# Patient Record
Sex: Female | Born: 1970 | Race: Black or African American | Hispanic: No | Marital: Married | Smoking: Never smoker
Health system: Southern US, Community
[De-identification: ages and names within clinical notes are randomized; demographics above are authoritative.]

## PROBLEM LIST (undated history)

## (undated) DIAGNOSIS — I493 Ventricular premature depolarization: Secondary | ICD-10-CM

## (undated) HISTORY — DX: Ventricular premature depolarization: I49.3

---

## 2013-10-31 ENCOUNTER — Emergency Department (HOSPITAL_COMMUNITY)
Admission: EM | Admit: 2013-10-31 | Discharge: 2013-10-31 | Disposition: A | Discharge status: Home / Self Care | Payer: Federal, State, Local not specified - PPO | Source: Home / Self Care | Attending: Emergency Medicine | Admitting: Emergency Medicine

## 2013-10-31 ENCOUNTER — Encounter (HOSPITAL_COMMUNITY): Payer: Self-pay | Admitting: Emergency Medicine

## 2013-10-31 ENCOUNTER — Emergency Department (INDEPENDENT_AMBULATORY_CARE_PROVIDER_SITE_OTHER): Payer: Federal, State, Local not specified - PPO

## 2013-10-31 DIAGNOSIS — J111 Influenza due to unidentified influenza virus with other respiratory manifestations: Secondary | ICD-10-CM

## 2013-10-31 DIAGNOSIS — R69 Illness, unspecified: Principal | ICD-10-CM

## 2013-10-31 MED ORDER — BENZONATATE 200 MG PO CAPS: 200.0000 mg | ORAL_CAPSULE | Freq: Three times a day (TID) | ORAL | Status: DC | PRN

## 2013-10-31 MED ORDER — OSELTAMIVIR PHOSPHATE 75 MG PO CAPS: 75.0000 mg | ORAL_CAPSULE | Freq: Two times a day (BID) | ORAL | Status: DC

## 2013-10-31 MED ORDER — TRAMADOL HCL 50 MG PO TABS: 100.0000 mg | ORAL_TABLET | Freq: Three times a day (TID) | ORAL | Status: DC | PRN

## 2013-10-31 MED ORDER — HYDROCOD POLST-CHLORPHEN POLST 10-8 MG/5ML PO LQCR: 5.0000 mL | Freq: Two times a day (BID) | ORAL | Status: DC | PRN

## 2013-10-31 NOTE — ED Notes (Signed)
C/o cold sx that include sinus drainage, chills, headache/pressure, itching/tingling feeling, body aches, cough with some mucous, and congestion that started 5 days ago. Stated that she has had some nausea and diarrhea. Has taken OTC medications with no relief. Written by: Marga MelnickQuaNeisha Jones, SMA

## 2013-10-31 NOTE — ED Provider Notes (Signed)
Chief Complaint   Chief Complaint  Patient presents with  . URI    History of Present Illness   Nyra Anspaugh is a 43 year old female who has had a 4 to five-day history of chills, myalgias, and fever of up to 101. She has sinus pain and pressure, headache, ear congestion, and nasal congestion with yellow drainage. She feels itching, and tingling of the skin, but no rash. Her muscles feel weak, tired, and achy. She's had a sore throat and a cough productive yellow sputum. She has had some diarrhea and nausea. She's had no specific exposures, however she does work at [employer] and is exposed to [employer]. She has not had an influenza vaccine this year.  Review of Systems   Other than as noted above, the patient denies any of the following symptoms: Systemic:  No fevers, chills, sweats, or myalgias. Eye:  No redness or discharge. ENT:  No ear pain, headache, nasal congestion, drainage, sinus pressure, or sore throat. Neck:  No neck pain, stiffness, or swollen glands. Lungs:  No cough, sputum production, hemoptysis, wheezing, chest tightness, shortness of breath or chest pain. GI:  No abdominal pain, nausea, vomiting or diarrhea.  PMFSH   Past medical history, family history, social history, meds, and allergies were reviewed. She is allergic to sulfa drugs.  Physical exam   Vital signs:  BP 126/75  Pulse 78  Temp(Src) 98 F (36.7 C) (Oral)  Resp 16  SpO2 100%  LMP 10/12/2013 General:  Alert and oriented.  In no distress.  Skin warm and dry. Eye:  No conjunctival injection or drainage. Lids were normal. ENT:  TMs and canals were normal, without erythema or inflammation.  Nasal mucosa was clear and uncongested, without drainage.  Mucous membranes were moist.  Pharynx was clear with no exudate or drainage.  There were no oral ulcerations or lesions. Neck:  Supple, no adenopathy, tenderness or mass. Lungs:  No respiratory distress.  Lungs were clear to auscultation, without  wheezes, rales or rhonchi.  Breath sounds were clear and equal bilaterally.  Heart:  Regular rhythm, without gallops, murmers or rubs. Skin:  Clear, warm, and dry, without rash or lesions.  Radiology   Dg Chest 2 View  10/31/2013   CLINICAL DATA:  Flu-like symptoms for past several days  EXAM: CHEST  2 VIEW  COMPARISON:  None.  FINDINGS: The lungs are well-expanded and clear. The cardiopericardial silhouette is normal in size. The mediastinum is normal in width. There is no pleural effusion or pneumothorax. The observed portions of the bony thorax appear normal.  IMPRESSION: There is no evidence of pneumonia nor other acute cardiopulmonary abnormality. Borderline hyperinflation may be voluntary or could reflect underlying reactive airway disease or acute bronchitis.   Electronically Signed   By: Clarrisa Kaylor  [place]   On: 10/31/2013 10:09   Assessment     The encounter diagnosis was Influenza-like illness.  Plan    1.  Meds:  The following meds were prescribed:   Discharge Medication List as of 10/31/2013 10:20 AM    START taking these medications   Details  benzonatate (TESSALON) 200 MG capsule Take 1 capsule (200 mg total) by mouth 3 (three) times daily as needed for cough., Starting 10/31/2013, Until Discontinued, Normal    chlorpheniramine-HYDROcodone (TUSSIONEX) 10-8 MG/5ML LQCR Take 5 mLs by mouth every 12 (twelve) hours as needed for cough., Starting 10/31/2013, Until Discontinued, Normal    oseltamivir (TAMIFLU) 75 MG capsule Take 1 capsule (75 mg total) by  mouth every 12 (twelve) hours., Starting 10/31/2013, Until Discontinued, Normal    traMADol (ULTRAM) 50 MG tablet Take 2 tablets (100 mg total) by mouth every 8 (eight) hours as needed., Starting 10/31/2013, Until Discontinued, Normal        2.  Patient Education/Counseling:  The patient was given appropriate handouts, self care instructions, and instructed in symptomatic relief.  Instructed to get extra fluids, rest, and use a cool mist  vaporizer.   3.  Follow up:  The patient was told to follow up here if no better in 3 to 4 days, or sooner if becoming worse in any way, and given some red flag symptoms such as increasing fever, difficulty breathing, chest pain, or persistent vomiting which would prompt immediate return.  Follow up here as needed.      Reuben Likesavid C Gunner Iodice, MD 10/31/13 1106

## 2013-10-31 NOTE — Discharge Instructions (Signed)
Most upper respiratory infections are caused by viruses and do not require antibiotics.  We try to save the antibiotics for when we really need them to prevent bacteria from developing resistance to them.  Here are a few hints about things that can be done at home to help get over an upper respiratory infection quicker: ° °Get extra sleep and extra fluids.  Get 7 to 9 hours of sleep per night and 6 to 8 glasses of water a day.  Getting extra sleep keeps the immune system from getting run down.  Most people with an upper respiratory infection are a little dehydrated.  The extra fluids also keep the secretions liquified and easier to deal with.  Also, get extra vitamin C.  4000 mg per day is the recommended dose. °For the aches, headache, and fever, acetaminophen or ibuprofen are helpful.  These can be alternated every 4 hours.  People with liver disease should avoid large amounts of acetaminophen, and people with ulcer disease, gastroesophageal reflux, gastritis, congestive heart failure, chronic kidney disease, coronary artery disease and the elderly should avoid ibuprofen. °For nasal congestion try Mucinex-D, or if you're having lots of sneezing or clear nasal drainage use Zyrtec-D. People with high blood pressure can take these if their blood pressure is controlled, if not, it's best to avoid the forms with a "D" (decongestants).  You can use the plain Mucinex, Allegra, Claritin, or Zyrtec even if your blood pressure is not controlled.   °A Saline nasal spray such as Ocean Spray can also help.  You can add a decongestant sprays such as Afrin, but you should not use the decongestant sprays for more than 3 or 4 days since they can be habituating.  Breathe Rite nasal strips can also offer a non-drug alternative treatment to nasal congestion, especially at night. °For people with symptoms of sinusitis, sleeping with your head elevated can be helpful.  For sinus pain, moist, hot compresses to the face may provide some  relief.  Many people find that inhaling steam as in a shower or from a pot of steaming water can help. °For any viral infection, zinc containing lozenges such as Cold-Eze or Zicam are helpful.  Zinc helps to fight viral infection.  Hot salt water gargles (8 oz of hot water, 1/2 tsp of table salt, and a pinch of baking soda) can give relief as well as hot beverages such as hot tea.  Sucrets extra strength lozenges will help the sore throat.  °For the cough, take Delsym 2 tsp every 12 hours.  It has also been found recently that Aleve can help control a cough.  The dose is 1 to 2 tablets twice daily with food.  This can be combined with Delsym. (Note, if you are taking ibuprofen, you should not take Aleve as well--take one or the other.) °A cool mist vaporizer will help keep your mucous membranes from drying out.  ° °It's important when you have an upper respiratory infection not to pass the infection to others.  This involves being very careful about the following: ° °Frequent hand washing or use of hand sanitizer, especially after coughing, sneezing, blowing your nose or touching your face, nose or eyes. °Do not shake hands or touch anyone and try to avoid touching surfaces that other people use such as doorknobs, shopping carts, telephones and computer keyboards. °Use tissues and dispose of them properly in a garbage can or ziplock bag. °Cough into your sleeve. °Do not let others eat or   drink after you. ° °It's also important to recognize the signs of serious illness and get evaluated if they occur: °Any respiratory infection that lasts more than 7 to 10 days.  Yellow nasal drainage and sputum are not reliable indicators of a bacterial infection, but if they last for more than 1 week, see your doctor. °Fever and sore throat can indicate strep. °Fever and cough can indicate influenza or pneumonia. °Any kind of severe symptom such as difficulty breathing, intractable vomiting, or severe pain should prompt you to see  a doctor as soon as possible. ° ° °Your body's immune system is really the thing that will get rid of this infection.  Your immune system is comprised of 2 types of specialized cells called T cells and B cells.  T cells coordinate the array of cells in your body that engulf invading bacteria or viruses while B cells orchestrate the production of antibodies that neutralize infection.  Anything we do or any medications we give you, will just strengthen your immune system or help it clear up the infection quicker.  Here are a few helpful hints to improve your immune system to help overcome this illness or to prevent future infections: °· A few vitamins can improve the health of your immune system.  That's why your diet should include plenty of fruits, vegetables, fish, nuts, and whole grains. °· Vitamin A and bet-carotene can increase the cells that fight infections (T cells and B cells).  Vitamin A is abundant in dark greens and orange vegetables such as spinach, greens, sweet potatoes, and carrots. °· Vitamin B6 contributes to the maturation of white blood cells, the cells that fight disease.  Foods with vitamin B6 include cold cereal and bananas. °· Vitamin C is credited with preventing colds because it increases white blood cells and also prevents cellular damage.  Citrus fruits, peaches and green and red bell peppers are all hight in vitamin C. °· Vitamin E is an anti-oxidant that encourages the production of natural killer cells which reject foreign invaders and B cells that produce antibodies.  Foods high in vitamin E include wheat germ, nuts and seeds. °· Foods high in omega-3 fatty acids found in foods like salmon, tuna and mackerel boost your immune system and help cells to engulf and absorb germs. °· Probiotics are good bacteria that increase your T cells.  These can be found in yogurt and are available in supplements such as Culturelle or Align. °· Moderate exercise increases the strength of your immune  system and your ability to recover from illness.  I suggest 3 to 5 moderate intensity 30 minute workouts per week.   °· Sleep is another component of maintaining a strong immune system.  It enables your body to recuperate from the day's activities, stress and work.  My recommendation is to get between 7 and 9 hours of sleep per night. °· If you smoke, try to quit completely or at least cut down.  Drink alcohol only in moderation if at all.  No more than 2 drinks daily for men or 1 for women. °· Get a flu vaccine early in the fall or if you have not gotten one yet, once this illness has run its course.  If you are over 65, a smoker, or an asthmatic, get a pneumococcal vaccine. °· My final recommendation is to maintain a healthy weight.  Excess weight can impair the immune system by interfering with the way the immune system deals with invading viruses or   bacteria. ° ° ° °Influenza, Adult °Influenza ("the flu") is a viral infection of the respiratory tract. It occurs more often in winter months because people spend more time in close contact with one another. Influenza can make you feel very sick. Influenza easily spreads from person to person (contagious). °CAUSES  °Influenza is caused by a virus that infects the respiratory tract. You can catch the virus by breathing in droplets from an infected person's cough or sneeze. You can also catch the virus by touching something that was recently contaminated with the virus and then touching your mouth, nose, or eyes. °SYMPTOMS  °Symptoms typically last 4 to 10 days and may include: °· Fever. °· Chills. °· Headache, body aches, and muscle aches. °· Sore throat. °· Chest discomfort and cough. °· Poor appetite. °· Weakness or feeling tired. °· Dizziness. °· Nausea or vomiting. °DIAGNOSIS  °Diagnosis of influenza is often made based on your history and a physical exam. A nose or throat swab test can be done to confirm the diagnosis. °RISKS AND COMPLICATIONS °You may be at risk  for a more severe case of influenza if you smoke cigarettes, have diabetes, have chronic heart disease (such as heart failure) or lung disease (such as asthma), or if you have a weakened immune system. Elderly people and pregnant women are also at risk for more serious infections. The most common complication of influenza is a lung infection (pneumonia). Sometimes, this complication can require emergency medical care and may be life-threatening. °PREVENTION  °An annual influenza vaccination (flu shot) is the best way to avoid getting influenza. An annual flu shot is now routinely recommended for all adults in the [place] °TREATMENT  °In mild cases, influenza goes away on its own. Treatment is directed at relieving symptoms. For more severe cases, your caregiver may prescribe antiviral medicines to shorten the sickness. Antibiotic medicines are not effective, because the infection is caused by a virus, not by bacteria. °HOME CARE INSTRUCTIONS °· Only take over-the-counter or prescription medicines for pain, discomfort, or fever as directed by your caregiver. °· Use a cool mist humidifier to make breathing easier. °· Get plenty of rest until your temperature returns to normal. This usually takes 3 to 4 days. °· Drink enough fluids to keep your urine clear or pale yellow. °· Cover your mouth and nose when coughing or sneezing, and wash your hands well to avoid spreading the virus. °· Stay home from work or school until your fever has been gone for at least 1 full day. °SEEK MEDICAL CARE IF:  °· You have chest pain or a deep cough that worsens or produces more mucus. °· You have nausea, vomiting, or diarrhea. °SEEK IMMEDIATE MEDICAL CARE IF:  °· You have difficulty breathing, shortness of breath, or your skin or nails turn bluish. °· You have severe neck pain or stiffness. °· You have a severe headache, facial pain, or earache. °· You have a worsening or recurring fever. °· You have nausea or vomiting that cannot be  controlled. °MAKE SURE YOU: °· Understand these instructions. °· Will watch your condition. °· Will get help right away if you are not doing well or get worse. °Document Released: 10/07/2000 Document Revised: 04/10/2012 Document Reviewed: 01/09/2012 °[employer]® Patient Information ©2014 [employer]. ° °

## 2013-11-04 NOTE — ED Notes (Signed)
Call from pharmacy. There has reportedly been a back order on tessalon due to [employer] recall; none available until March. What will the MD authorize for us to use instead. Spoke w Dr Lorenz CoasterKeller, who wishes pt to use Tussionex at night and delsym OTC as per label instructions during day hours. [facility] and spoke directly w pharmacist to advise of MD instructions

## 2013-12-13 ENCOUNTER — Emergency Department (HOSPITAL_COMMUNITY)
Admission: EM | Admit: 2013-12-13 | Discharge: 2013-12-13 | Disposition: A | Discharge status: Home / Self Care | Payer: Federal, State, Local not specified - PPO | Source: Home / Self Care | Attending: Family Medicine | Admitting: Family Medicine

## 2013-12-13 ENCOUNTER — Encounter (HOSPITAL_COMMUNITY): Payer: Self-pay | Admitting: Emergency Medicine

## 2013-12-13 DIAGNOSIS — J329 Chronic sinusitis, unspecified: Secondary | ICD-10-CM

## 2013-12-13 MED ORDER — DOXYCYCLINE HYCLATE 100 MG PO CAPS: 100.0000 mg | ORAL_CAPSULE | Freq: Two times a day (BID) | ORAL | Status: DC

## 2013-12-13 MED ORDER — GUAIFENESIN-CODEINE 100-10 MG/5ML PO SOLN: 10.0000 mL | Freq: Three times a day (TID) | ORAL | Status: DC | PRN

## 2013-12-13 MED ORDER — METHYLPREDNISOLONE 4 MG PO KIT: PACK | ORAL | Status: DC

## 2013-12-13 MED ORDER — FLUTICASONE PROPIONATE 50 MCG/ACT NA SUSP: 2.0000 | Freq: Two times a day (BID) | NASAL | Status: DC

## 2013-12-13 MED ORDER — MAGIC MOUTHWASH W/LIDOCAINE: ORAL | Status: DC

## 2013-12-13 NOTE — ED Provider Notes (Signed)
CSN: [id number]     Arrival date & time 12/13/13  1009 History   First MD Initiated Contact with Patient 12/13/13 1028     Chief Complaint  Patient presents with  . URI     (Consider location/radiation/quality/duration/timing/severity/associated sxs/prior Treatment) HPI Comments: 14f presents c/o sinus pressure, sore throat, cough, bilateral ear pain, sneezing, chills for 1 week.  Her symptoms have all been gradually worsening. she has a history of sinus infections and believes this may be what is going on. She denies pleuritic chest pain, shortness of breath. She has no recent travel or sick contacts. She has been taking over-the-counter medications which do help only temporarily.  Patient is a 43 y.o. female presenting with URI.  URI Presenting symptoms: congestion, cough, ear pain, rhinorrhea and sore throat   Presenting symptoms: no fever   Associated symptoms: sneezing   Associated symptoms: no arthralgias and no myalgias     History reviewed. No pertinent past medical history. History reviewed. No pertinent past surgical history. History reviewed. No pertinent family history. History  Substance Use Topics  . Smoking status: Never Smoker   . Smokeless tobacco: Not on file  . Alcohol Use: No   OB History   Grav Para Term Preterm Abortions TAB SAB Ect Mult Living                 Review of Systems  Constitutional: Negative for fever and chills.  HENT: Positive for congestion, ear pain, postnasal drip, rhinorrhea, sinus pressure, sneezing and sore throat. Negative for trouble swallowing.   Eyes: Negative for visual disturbance.  Respiratory: Positive for cough. Negative for chest tightness and shortness of breath.   Cardiovascular: Negative for chest pain, palpitations and leg swelling.  Gastrointestinal: Negative for nausea, vomiting and abdominal pain.  Endocrine: Negative for polydipsia and polyuria.  Genitourinary: Negative for dysuria, urgency and frequency.   Musculoskeletal: Negative for arthralgias and myalgias.  Skin: Negative for rash.  Neurological: Negative for dizziness, weakness and light-headedness.      Allergies  Sulfa antibiotics  Home Medications   Current Outpatient Rx  Name  Route  Sig  Dispense  Refill  . benzonatate (TESSALON) 200 MG capsule   Oral   Take 1 capsule (200 mg total) by mouth 3 (three) times daily as needed for cough.   30 capsule   0   . chlorpheniramine-HYDROcodone (TUSSIONEX) 10-8 MG/5ML LQCR   Oral   Take 5 mLs by mouth every 12 (twelve) hours as needed for cough.   140 mL   0   . doxycycline (VIBRAMYCIN) 100 MG capsule   Oral   Take 1 capsule (100 mg total) by mouth 2 (two) times daily.   20 capsule   0   . fluticasone (FLONASE) 50 MCG/ACT nasal spray   Each Nare   Place 2 sprays into both nostrils 2 (two) times daily.   1 g   2   . guaiFENesin-codeine 100-10 MG/5ML syrup   Oral   Take 10 mLs by mouth 3 (three) times daily as needed for cough.   120 mL   0   . methylPREDNISolone (MEDROL DOSEPAK) 4 MG tablet      follow package directions   21 tablet   0     Dispense as written.   . Multiple Vitamin (MULTIVITAMIN) tablet   Oral   Take 1 tablet by mouth daily.         Marland Kitchen oseltamivir (TAMIFLU) 75 MG capsule   Oral  Take 1 capsule (75 mg total) by mouth every 12 (twelve) hours.   10 capsule   0   . traMADol (ULTRAM) 50 MG tablet   Oral   Take 2 tablets (100 mg total) by mouth every 8 (eight) hours as needed.   30 tablet   0    BP 111/54  Pulse 60  Temp(Src) 98.8 F (37.1 C) (Oral)  Resp 20  SpO2 100%  LMP 12/07/2013 Physical Exam  Nursing note and vitals reviewed. Constitutional: She is oriented to person, place, and time. Vital signs are normal. She appears well-developed and well-nourished. No distress.  HENT:  Head: Normocephalic and atraumatic.  Right Ear: Tympanic membrane, external ear and ear canal normal.  Left Ear: Tympanic membrane, external ear  and ear canal normal.  Nose: Mucosal edema and rhinorrhea present. Right sinus exhibits maxillary sinus tenderness and frontal sinus tenderness. Left sinus exhibits maxillary sinus tenderness and frontal sinus tenderness.  Mouth/Throat: Uvula is midline and mucous membranes are normal. Posterior oropharyngeal erythema present. No oropharyngeal exudate or posterior oropharyngeal edema.  Purulent drainage in the posterior oropharynx   Neck: Normal range of motion. Neck supple.  Cardiovascular: Normal rate, regular rhythm and normal heart sounds.  Exam reveals no gallop and no friction rub.   No murmur heard. Pulmonary/Chest: Effort normal and breath sounds normal. No respiratory distress. She has no wheezes. She has no rales.  Lymphadenopathy:    She has cervical adenopathy (tonsillar).  Neurological: She is alert and oriented to person, place, and time. She has normal strength. Coordination normal.  Skin: Skin is warm and dry. No rash noted. She is not diaphoretic.  Psychiatric: She has a normal mood and affect. Judgment normal.    ED Course  Procedures (including critical care time) Labs Review Labs Reviewed - No data to display Imaging Review No results found.    MDM   Final diagnoses:  Sinusitis   Still worsening after a week, probable bacterial sinusitis.  Treating with doxy, f/u if not improving   Meds ordered this encounter  Medications  . doxycycline (VIBRAMYCIN) 100 MG capsule    Sig: Take 1 capsule (100 mg total) by mouth 2 (two) times daily.    Dispense:  20 capsule    Refill:  0    Order Specific Question:  Supervising Provider    Answer:  Linna HoffKINDL, JAMES D [phone][[id number]]  . methylPREDNISolone (MEDROL DOSEPAK) 4 MG tablet    Sig: follow package directions    Dispense:  21 tablet    Refill:  0    Order Specific Question:  Supervising Provider    Answer:  Linna HoffKINDL, JAMES D [phone][[id number]]  . fluticasone (FLONASE) 50 MCG/ACT nasal spray    Sig: Place 2 sprays into both nostrils 2 (two)  times daily.    Dispense:  1 g    Refill:  2    Order Specific Question:  Supervising Provider    Answer:  Linna HoffKINDL, JAMES D [phone][[id number]]  . guaiFENesin-codeine 100-10 MG/5ML syrup    Sig: Take 10 mLs by mouth 3 (three) times daily as needed for cough.    Dispense:  120 mL    Refill:  0    Order Specific Question:  Supervising Provider    Answer:  Bradd CanaryKINDL, JAMES D [[id number]]       Graylon GoodZachary H Jeidi Gilles, PA-C 12/13/13 1101

## 2013-12-13 NOTE — ED Notes (Signed)
C/o  Sinus pressure and pain.  Pain with swallowing.  Bilateral ear discomfort.  Productive cough.  Sneezing and chills.  Mild relief with otc meds.  Symptoms present x 1 wk.  Denies n/v/d.

## 2013-12-13 NOTE — ED Provider Notes (Signed)
Medical screening examination/treatment/procedure(s) were performed by resident physician or non-physician practitioner and as supervising physician I was immediately available for consultation/collaboration.   Barkley BrunsKINDL,JAMES DOUGLAS MD.   Linna HoffJames D Kindl, MD 12/13/13 [phone]

## 2013-12-13 NOTE — Discharge Instructions (Signed)

## 2013-12-25 ENCOUNTER — Other Ambulatory Visit: Payer: Self-pay | Admitting: Orthopedic Surgery

## 2013-12-25 DIAGNOSIS — M542 Cervicalgia: Secondary | ICD-10-CM

## 2013-12-27 ENCOUNTER — Telehealth (HOSPITAL_COMMUNITY): Payer: Self-pay | Admitting: *Deleted

## 2013-12-27 NOTE — ED Notes (Signed)
Pharmacy called to have Rx of Flonase changed from 2 sprays twice a day to 2 sprays once a day. Rx changed to 2 sprays once a day by Dr. Melrose NakayamaKindle.

## 2013-12-29 ENCOUNTER — Ambulatory Visit
Admission: RE | Admit: 2013-12-29 | Discharge: 2013-12-29 | Disposition: A | Discharge status: Home / Self Care | Payer: Federal, State, Local not specified - PPO | Source: Ambulatory Visit | Attending: Orthopedic Surgery | Admitting: Orthopedic Surgery

## 2013-12-29 DIAGNOSIS — M542 Cervicalgia: Secondary | ICD-10-CM

## 2014-01-14 ENCOUNTER — Other Ambulatory Visit: Payer: Self-pay

## 2014-01-14 DIAGNOSIS — Z1231 Encounter for screening mammogram for malignant neoplasm of breast: Secondary | ICD-10-CM

## 2014-01-24 ENCOUNTER — Ambulatory Visit
Admission: RE | Admit: 2014-01-24 | Discharge: 2014-01-24 | Disposition: A | Discharge status: Home / Self Care | Payer: Federal, State, Local not specified - PPO | Source: Ambulatory Visit

## 2014-01-24 ENCOUNTER — Other Ambulatory Visit: Payer: Self-pay

## 2014-01-24 DIAGNOSIS — Z1231 Encounter for screening mammogram for malignant neoplasm of breast: Secondary | ICD-10-CM

## 2016-05-04 ENCOUNTER — Ambulatory Visit (INDEPENDENT_AMBULATORY_CARE_PROVIDER_SITE_OTHER): Payer: Federal, State, Local not specified - PPO | Admitting: Family Medicine

## 2016-05-04 ENCOUNTER — Ambulatory Visit (INDEPENDENT_AMBULATORY_CARE_PROVIDER_SITE_OTHER): Payer: Federal, State, Local not specified - PPO

## 2016-05-04 VITALS — BP 122/82 | HR 65 | Temp 98.7°F | Resp 16 | Ht 66.0 in | Wt 215.0 lb

## 2016-05-04 DIAGNOSIS — R05 Cough: Secondary | ICD-10-CM

## 2016-05-04 DIAGNOSIS — J189 Pneumonia, unspecified organism: Secondary | ICD-10-CM

## 2016-05-04 DIAGNOSIS — R059 Cough, unspecified: Secondary | ICD-10-CM

## 2016-05-04 MED ORDER — HYDROCODONE-HOMATROPINE 5-1.5 MG/5ML PO SYRP: 5.0000 mL | ORAL_SOLUTION | Freq: Three times a day (TID) | ORAL | Status: DC | PRN

## 2016-05-04 MED ORDER — LEVOFLOXACIN 500 MG PO TABS: 500.0000 mg | ORAL_TABLET | Freq: Every day | ORAL | Status: DC

## 2016-05-04 NOTE — Progress Notes (Signed)
   HPI  Patient presents today here with cough, new patient  Pt explains she has had 4-5 days of cough productive of thick green sputum, mild dyspnea, congestion. She reports fever as high as 101.8 Has chills, sweats, and lmalaise  She is tolerating food and fluids normally.   Her cough is very persistent, Not responding to mucinex, robitussin    PMH: Smoking status noted fam Hx positive for DM2, HTN, HLD + in mother and father Allergic to sulfa No sig medical Hx Non smoker, doesnt drink alcohol  ROS: Per HPI  Objective: BP 122/82 mmHg  Pulse 65  Temp(Src) 98.7 F (37.1 C) (Oral)  Resp 16  Ht 5\' 6"  (1.676 m)  Wt 215 lb (97.523 kg)  BMI 34.72 kg/m2  SpO2 98%  LMP 05/03/2016 (Exact Date) Gen: NAD, alert, cooperative with exam, sweating HEENT: NCAT, TMs WNL, Nares with swollen turbinates, Slightky enlarged tonsils without exudates CV: RRR, good S1/S2, no murmur Resp: CTABL, no wheezes, non-labored Ext: No edema, warm Neuro: Alert and oriented, No gross deficits  CXR with infiltrate and likely RUL PNA  Assessment and plan:  # CAP Treat with levaquin, Hycodan for night-time cough RTC with any concerns or failure to improve, recommend f/u CXR in 4 weeks.     Orders Placed This Encounter  Procedures  . DG Chest 2 View    Standing Status: Future     Number of Occurrences: 1     Standing Expiration Date: 07/05/2017    Order Specific Question:  Reason for Exam (SYMPTOM  OR DIAGNOSIS REQUIRED)    Answer:  cough, eval for CAP    Order Specific Question:  Is the patient pregnant?    Answer:  No    Order Specific Question:  Preferred imaging location?    Answer:  External    No orders of the defined types were placed in this encounter.    Kevin FentonSamuel Bradshaw, MD 11:59 AM

## 2016-05-04 NOTE — Patient Instructions (Addendum)
Great to meet you!  I am treating you for pneumonia with levaquin.   I recommend follow up in 3-4 weeks to make sure your chest x ray looks completely resolved.   Community-Acquired Pneumonia, Adult Pneumonia is an infection of the lungs. There are different types of pneumonia. One type can develop while a person is in a hospital. A different type, called community-acquired pneumonia, develops in people who are not, or have not recently been, in the hospital or other health care facility.  CAUSES Pneumonia may be caused by bacteria, viruses, or funguses. Community-acquired pneumonia is often caused by Streptococcus pneumonia bacteria. These bacteria are often passed from one person to another by breathing in droplets from the cough or sneeze of an infected person. RISK FACTORS The condition is more likely to develop in:  People who havechronic diseases, such as chronic obstructive pulmonary disease (COPD), asthma, congestive heart failure, cystic fibrosis, diabetes, or kidney disease.  People who haveearly-stage or late-stage HIV.  People who havesickle cell disease.  People who havehad their spleen removed (splenectomy).  People who havepoor Administrator.  People who havemedical conditions that increase the risk of breathing in (aspirating) secretions their own mouth and nose.   People who havea weakened immune system (immunocompromised).  People who smoke.  People whotravel to areas where pneumonia-causing germs commonly exist.  People whoare around animal habitats or animals that have pneumonia-causing germs, including birds, bats, rabbits, cats, and farm animals. SYMPTOMS Symptoms of this condition include:  Adry cough.  A wet (productive) cough.  Fever.  Sweating.  Chest pain, especially when breathing deeply or coughing.  Rapid breathing or difficulty breathing.  Shortness of breath.  Shaking chills.  Fatigue.  Muscle aches. DIAGNOSIS Your  health care provider will take a medical history and perform a physical exam. You may also have other tests, including:  Imaging studies of your chest, including X-rays.  Tests to check your blood oxygen level and other blood gases.  Other tests on blood, mucus (sputum), fluid around your lungs (pleural fluid), and urine. If your pneumonia is severe, other tests may be done to identify the specific cause of your illness. TREATMENT The type of treatment that you receive depends on many factors, such as the cause of your pneumonia, the medicines you take, and other medical conditions that you have. For most adults, treatment and recovery from pneumonia may occur at home. In some cases, treatment must happen in a hospital. Treatment may include:  Antibiotic medicines, if the pneumonia was caused by bacteria.  Antiviral medicines, if the pneumonia was caused by a virus.  Medicines that are given by mouth or through an IV tube.  Oxygen.  Respiratory therapy. Although rare, treating severe pneumonia may include:  Mechanical ventilation. This is done if you are not breathing well on your own and you cannot maintain a safe blood oxygen level.  Thoracentesis. This procedureremoves fluid around one lung or both lungs to help you breathe better. HOME CARE INSTRUCTIONS  Take over-the-counter and prescription medicines only as told by your health care provider.  Only takecough medicine if you are losing sleep. Understand that cough medicine can prevent your body's natural ability to remove mucus from your lungs.  If you were prescribed an antibiotic medicine, take it as told by your health care provider. Do not stop taking the antibiotic even if you start to feel better.  Sleep in a semi-upright position at night. Try sleeping in a reclining chair, or place a few  pillows under your head.  Do not use tobacco products, including cigarettes, chewing tobacco, and e-cigarettes. If you need help  quitting, ask your health care provider.  Drink enough water to keep your urine clear or pale yellow. This will help to thin out mucus secretions in your lungs. PREVENTION There are ways that you can decrease your risk of developing community-acquired pneumonia. Consider getting a pneumococcal vaccine if:  You are older than 45 years of age.  You are older than 45 years of age and are undergoing cancer treatment, have chronic lung disease, or have other medical conditions that affect your immune system. Ask your health care provider if this applies to you. There are different types and schedules of pneumococcal vaccines. Ask your health care provider which vaccination option is best for you. You may also prevent community-acquired pneumonia if you take these actions:  Get an influenza vaccine every year. Ask your health care provider which type of influenza vaccine is best for you.  Go to the dentist on a regular basis.  Wash your hands often. Use hand sanitizer if soap and water are not available. SEEK MEDICAL CARE IF:  You have a fever.  You are losing sleep because you cannot control your cough with cough medicine. SEEK IMMEDIATE MEDICAL CARE IF:  You have worsening shortness of breath.  You have increased chest pain.  Your sickness becomes worse, especially if you are an older adult or have a weakened immune system.  You cough up blood.   This information is not intended to replace advice given to you by your health care provider. Make sure you discuss any questions you have with your health care provider.   Document Released: 10/10/2005 Document Revised: 07/01/2015 Document Reviewed: 02/04/2015 Elsevier Interactive Patient Education 2016 [employer]    IF you received an x-ray today, you will [facility] an [facility]. Please contact [facility] at [phone] with questions or concerns regarding your invoice.   IF you received labwork  today, you will receive an invoice from [facility]. Please contact [facility] at [phone] with questions or concerns regarding your invoice.   Our billing staff will not be able to assist you with questions regarding bills from these companies.  You will be contacted with the lab results as soon as they are available. The fastest way to get your results is to activate your My Chart account. Instructions are located on the last page of this paperwork. If you have not heard from us regarding the results in 2 weeks, please contact this office.

## 2017-07-31 ENCOUNTER — Encounter: Payer: Self-pay | Admitting: Physician Assistant

## 2017-07-31 ENCOUNTER — Ambulatory Visit (INDEPENDENT_AMBULATORY_CARE_PROVIDER_SITE_OTHER): Payer: Federal, State, Local not specified - PPO | Admitting: Physician Assistant

## 2017-07-31 VITALS — BP 130/64 | HR 44 | Temp 98.7°F | Resp 16 | Ht 66.0 in | Wt 224.0 lb

## 2017-07-31 DIAGNOSIS — R5383 Other fatigue: Secondary | ICD-10-CM | POA: Diagnosis not present

## 2017-07-31 DIAGNOSIS — B379 Candidiasis, unspecified: Secondary | ICD-10-CM

## 2017-07-31 DIAGNOSIS — Z23 Encounter for immunization: Secondary | ICD-10-CM

## 2017-07-31 DIAGNOSIS — J0140 Acute pansinusitis, unspecified: Secondary | ICD-10-CM | POA: Diagnosis not present

## 2017-07-31 DIAGNOSIS — R9431 Abnormal electrocardiogram [ECG] [EKG]: Secondary | ICD-10-CM | POA: Diagnosis not present

## 2017-07-31 DIAGNOSIS — R001 Bradycardia, unspecified: Secondary | ICD-10-CM

## 2017-07-31 DIAGNOSIS — I498 Other specified cardiac arrhythmias: Secondary | ICD-10-CM

## 2017-07-31 DIAGNOSIS — I499 Cardiac arrhythmia, unspecified: Secondary | ICD-10-CM

## 2017-07-31 DIAGNOSIS — N898 Other specified noninflammatory disorders of vagina: Secondary | ICD-10-CM | POA: Diagnosis not present

## 2017-07-31 LAB — POCT WET + KOH PREP
TRICH BY WET PREP: ABSENT
Yeast by wet prep: ABSENT

## 2017-07-31 LAB — POCT URINALYSIS DIP (MANUAL ENTRY)
BILIRUBIN UA: NEGATIVE
GLUCOSE UA: NEGATIVE mg/dL
Ketones, POC UA: NEGATIVE mg/dL
Nitrite, UA: NEGATIVE
PROTEIN UA: NEGATIVE mg/dL
SPEC GRAV UA: 1.025 (ref 1.010–1.025)
Urobilinogen, UA: 0.2 E.U./dL
pH, UA: 7 (ref 5.0–8.0)

## 2017-07-31 MED ORDER — FLUCONAZOLE 150 MG PO TABS: 150.0000 mg | ORAL_TABLET | Freq: Once | ORAL | 0 refills | Status: AC

## 2017-07-31 MED ORDER — FLUTICASONE PROPIONATE 50 MCG/ACT NA SUSP: 2.0000 | Freq: Every day | NASAL | 12 refills | Status: DC

## 2017-07-31 MED ORDER — AMOXICILLIN-POT CLAVULANATE 875-125 MG PO TABS: 1.0000 | ORAL_TABLET | Freq: Two times a day (BID) | ORAL | 0 refills | Status: AC

## 2017-07-31 NOTE — Progress Notes (Signed)
PRIMARY CARE AT [facility] [address], [place] [address] [phone]  Date:  07/31/2017   Name:  Christy Holland   DOB:  12/12/1970   MRN:  [id number]  PCP:  Patient, No Pcp Per    History of Present Illness:  Christy Holland is a 46 y.o. female patient who presents to PCP with  Chief Complaint  Patient presents with  . Vaginal Discharge    itching and burning on and off month  . Sinusitis     1 month of itching and burning for 1 month.  Discharge is thick yellow-ish white.  Odor is fishy/musty.  She is having urinary frequency.   She is currently not sexually active.   No abdominal pain.   Pressure and drainage and headaches and thick mucus.  She is sneezing and water allergies.  She is currently not taking any allergy pills.  She has no allergy.  This has been for 2-3 weeks.  She has tried over the counter use.  Patient is here with HR at 44.  She states that she is fatigued for awhile.  No chest pains, palpitations.  She has noticed some lightheadedness with standing.  She notes no hx of thyroid disease personally.  Family with thyroid disease--sister, and niece.   CAD of father with multiple stents.   No hx of diabetes or CAD that she knows, but not followed medically.   She works as an Dance movement psychotherapist.    There are no active problems to display for this patient.   History reviewed. No pertinent past medical history.  History reviewed. No pertinent surgical history.  Social History  Substance Use Topics  . Smoking status: Never Smoker  . Smokeless tobacco: Never Used  . Alcohol use No    Family History  Problem Relation Age of Onset  . Hypertension Mother   . Hyperlipidemia Mother   . Diabetes Mother   . Hypertension Father   . Hyperlipidemia Father   . Diabetes Father     Allergies  Allergen Reactions  . Sulfa Antibiotics     Medication list has been reviewed and updated.  Current Outpatient Prescriptions on File Prior to Visit  Medication Sig  Dispense Refill  . HYDROcodone-homatropine (HYCODAN) 5-1.5 MG/5ML syrup Take 5 mLs by mouth every 8 (eight) hours as needed for cough. (Patient not taking: Reported on 07/31/2017) 120 mL 0  . levofloxacin (LEVAQUIN) 500 MG tablet Take 1 tablet (500 mg total) by mouth daily. (Patient not taking: Reported on 07/31/2017) 7 tablet 0   No current facility-administered medications on file prior to visit.     ROS ROS otherwise unremarkable unless listed above.  Physical Examination: BP 130/64   Pulse (!) 44   Temp 98.7 F (37.1 C) (Oral)   Resp 16   Ht '5\' 6"'  (1.676 m)   Wt 224 lb (101.6 kg)   LMP 07/14/2017   SpO2 96%   BMI 36.15 kg/m  Ideal Body Weight: Weight in (lb) to have BMI = 25: 154.6  Physical Exam  Constitutional: She is oriented to person, place, and time. She appears well-developed and well-nourished. No distress.  HENT:  Head: Normocephalic and atraumatic.  Right Ear: Tympanic membrane, external ear and ear canal normal.  Left Ear: Tympanic membrane, external ear and ear canal normal.  Nose: Mucosal edema and rhinorrhea present. Right sinus exhibits no maxillary sinus tenderness and no frontal sinus tenderness. Left sinus exhibits no maxillary sinus tenderness and no frontal sinus tenderness.  Mouth/Throat:  No uvula swelling. No oropharyngeal exudate, posterior oropharyngeal edema or posterior oropharyngeal erythema.  Eyes: Pupils are equal, round, and reactive to light. Conjunctivae and EOM are normal.  Cardiovascular: Normal rate.  A regularly irregular rhythm present. Exam reveals no gallop, no distant heart sounds and no friction rub.   No murmur heard. Pulses:      Radial pulses are 2+ on the right side, and 2+ on the left side.       Dorsalis pedis pulses are 2+ on the right side, and 2+ on the left side.  Pulmonary/Chest: Effort normal. No respiratory distress. She has no decreased breath sounds. She has no wheezes. She has no rhonchi.  Genitourinary: Pelvic exam was  performed with patient supine. Cervix exhibits no friability. Vaginal discharge found.  Lymphadenopathy:       Head (right side): No submandibular, no tonsillar, no preauricular and no posterior auricular adenopathy present.       Head (left side): No submandibular, no tonsillar, no preauricular and no posterior auricular adenopathy present.  Neurological: She is alert and oriented to person, place, and time.  Skin: She is not diaphoretic.  Psychiatric: She has a normal mood and affect. Her behavior is normal.    Results for orders placed or performed in visit on 07/31/17  POCT urinalysis dipstick  Result Value Ref Range   Color, UA yellow yellow   Clarity, UA clear clear   Glucose, UA negative negative mg/dL   Bilirubin, UA negative negative   Ketones, POC UA negative negative mg/dL   Spec Grav, UA 1.025 1.010 - 1.025   Blood, UA trace-intact (A) negative   pH, UA 7.0 5.0 - 8.0   Protein Ur, POC negative negative mg/dL   Urobilinogen, UA 0.2 0.2 or 1.0 E.U./dL   Nitrite, UA Negative Negative   Leukocytes, UA Small (1+) (A) Negative  POCT Wet + KOH Prep  Result Value Ref Range   Yeast by KOH Present (A) Absent   Yeast by wet prep Absent Absent   WBC by wet prep Few Few   Clue Cells Wet Prep HPF POC None None   Trich by wet prep Absent Absent   Bacteria Wet Prep HPF POC Few Few   Epithelial Cells By [employer] (UMFC) Few None, Few, Too numerous to count   RBC,UR,HPF,POC None None RBC/hpf   Results for orders placed or performed in visit on 07/31/17  POCT urinalysis dipstick  Result Value Ref Range   Color, UA yellow yellow   Clarity, UA clear clear   Glucose, UA negative negative mg/dL   Bilirubin, UA negative negative   Ketones, POC UA negative negative mg/dL   Spec Grav, UA 1.025 1.010 - 1.025   Blood, UA trace-intact (A) negative   pH, UA 7.0 5.0 - 8.0   Protein Ur, POC negative negative mg/dL   Urobilinogen, UA 0.2 0.2 or 1.0 E.U./dL   Nitrite, UA Negative Negative    Leukocytes, UA Small (1+) (A) Negative  POCT Wet + KOH Prep  Result Value Ref Range   Yeast by KOH Present (A) Absent   Yeast by wet prep Absent Absent   WBC by wet prep Few Few   Clue Cells Wet Prep HPF POC None None   Trich by wet prep Absent Absent   Bacteria Wet Prep HPF POC Few Few   Epithelial Cells By [employer] (UMFC) Few None, Few, Too numerous to count   RBC,UR,HPF,POC None None RBC/hpf     Assessment  and Plan: Christy Holland is a 46 y.o. female who is here today for cc of  Chief Complaint  Patient presents with  . Vaginal Discharge    itching and burning on and off month  . Sinusitis  --urgent referral with symptomatic arrhythmia.  Thank you --will treat sinus and yeast at this time.  Advised alarming signs to warrant immediate return.   Bradycardia - Plan: CBC, CMP14+EGFR, TSH, EKG 12-Lead  Need for influenza vaccination - Plan: Flu Vaccine QUAD 36+ mos IM  Vaginal discharge - Plan: POCT urinalysis dipstick, POCT Wet + KOH Prep  Other fatigue - Plan: CBC, CMP14+EGFR, TSH, EKG 12-Lead, Ambulatory referral to Cardiology  Nonspecific abnormal electrocardiogram (ECG) (EKG) - Plan: Ambulatory referral to Cardiology  Ventricular bigeminy - Plan: Ambulatory referral to Cardiology  Yeast infection - Plan: fluconazole (DIFLUCAN) 150 MG tablet  Subacute pansinusitis - Plan: amoxicillin-clavulanate (AUGMENTIN) 875-125 MG tablet, fluticasone (FLONASE) 50 MCG/ACT nasal spray  Ivar Drape, PA-C [facility] 10/9/20189:52 AM

## 2017-07-31 NOTE — Patient Instructions (Addendum)
I will have your lab results shortly. I am going to refer you to cardiology.  Please await this contact.  You must go to this.    Sinusitis, Adult Sinusitis is soreness and inflammation of your sinuses. Sinuses are hollow spaces in the bones around your face. They are located:  Around your eyes.  In the middle of your forehead.  Behind your nose.  In your cheekbones.  Your sinuses and nasal passages are lined with a stringy fluid (mucus). Mucus normally drains out of your sinuses. When your nasal tissues get inflamed or swollen, the mucus can get trapped or blocked so air cannot flow through your sinuses. This lets bacteria, viruses, and funguses grow, and that leads to infection. Follow these instructions at home: Medicines  Take, use, or apply over-the-counter and prescription medicines only as told by your doctor. These may include nasal sprays.  If you were prescribed an antibiotic medicine, take it as told by your doctor. Do not stop taking the antibiotic even if you start to feel better. Hydrate and Humidify  Drink enough water to keep your pee (urine) clear or pale yellow.  Use a cool mist humidifier to keep the humidity level in your home above 50%.  Breathe in steam for 10-15 minutes, 3-4 times a day or as told by your doctor. You can do this in the bathroom while a hot shower is running.  Try not to spend time in cool or dry air. Rest  Rest as much as possible.  Sleep with your head raised (elevated).  Make sure to get enough sleep each night. General instructions  Put a warm, moist washcloth on your face 3-4 times a day or as told by your doctor. This will help with discomfort.  Wash your hands often with soap and water. If there is no soap and water, use hand sanitizer.  Do not smoke. Avoid being around people who are smoking (secondhand smoke).  Keep all follow-up visits as told by your doctor. This is important. Contact a doctor if:  You have a  fever.  Your symptoms get worse.  Your symptoms do not get better within 10 days. Get help right away if:  You have a very bad headache.  You cannot stop throwing up (vomiting).  You have pain or swelling around your face or eyes.  You have trouble seeing.  You feel confused.  Your neck is stiff.  You have trouble breathing. This information is not intended to replace advice given to you by your health care provider. Make sure you discuss any questions you have with your health care provider. Document Released: 03/28/2008 Document Revised: 06/05/2016 Document Reviewed: 08/05/2015 Elsevier Interactive Patient Education  2018 [employer].     IF you received an x-ray today, you will receive an invoice from [facility]. Please contact [facility] at [phone] with questions or concerns regarding your invoice.   IF you received labwork today, you will receive an invoice from [facility]. Please contact [facility] at [phone] with questions or concerns regarding your invoice.   Our billing staff will not be able to assist you with questions regarding bills from these companies.  You will be contacted with the lab results as soon as they are available. The fastest way to get your results is to activate your My Chart account. Instructions are located on the last page of this paperwork. If you have not heard from [place] regarding the results in 2 weeks, please contact this office.

## 2017-08-01 LAB — CMP14+EGFR
ALT: 11 IU/L (ref 0–32)
AST: 26 IU/L (ref 0–40)
Albumin/Globulin Ratio: 1.6 (ref 1.2–2.2)
Albumin: 4.2 g/dL (ref 3.5–5.5)
Alkaline Phosphatase: 63 IU/L (ref 39–117)
BUN/Creatinine Ratio: 18 (ref 9–23)
BUN: 14 mg/dL (ref 6–24)
Bilirubin Total: <0.2 mg/dL (ref 0.0–1.2)
CO2: 26 mmol/L (ref 20–29)
CREATININE: 0.79 mg/dL (ref 0.57–1.00)
Calcium: 9.3 mg/dL (ref 8.7–10.2)
Chloride: 98 mmol/L (ref 96–106)
GFR calc non Af Amer: 90 mL/min/{1.73_m2} (ref >59)
GFR, EST AFRICAN AMERICAN: 104 mL/min/{1.73_m2} (ref >59)
GLUCOSE: 88 mg/dL (ref 65–99)
Globulin, Total: 2.7 g/dL (ref 1.5–4.5)
Potassium: 4.5 mmol/L (ref 3.5–5.2)
Sodium: 137 mmol/L (ref 134–144)
Total Protein: 6.9 g/dL (ref 6.0–8.5)

## 2017-08-01 LAB — CBC
HEMOGLOBIN: 12.5 g/dL (ref 11.1–15.9)
Hematocrit: 37.8 % (ref 34.0–46.6)
MCH: 30.3 pg (ref 26.6–33.0)
MCHC: 33.1 g/dL (ref 31.5–35.7)
MCV: 92 fL (ref 79–97)
PLATELETS: 204 10*3/uL (ref 150–379)
RBC: 4.13 x10E6/uL (ref 3.77–5.28)
RDW: 13.6 % (ref 12.3–15.4)
WBC: 6.6 10*3/uL (ref 3.4–10.8)

## 2017-08-01 LAB — TSH: TSH: 1.45 u[IU]/mL (ref 0.450–4.500)

## 2017-08-04 ENCOUNTER — Telehealth: Payer: Self-pay | Admitting: Physician Assistant

## 2017-08-04 NOTE — Telephone Encounter (Signed)
Beverly from [facility] called stated that she has called pt several times to set up an appt.  and can not reach pt nor leave a vm for pt because pt mailbox is full tried calling pt husband and his phone is not in service..pt is unable to be reached to sch an appt for referral..

## 2017-08-07 NOTE — Telephone Encounter (Signed)
Please have patient contacted for why she did not attend her referral.

## 2017-08-10 NOTE — Telephone Encounter (Signed)
IC pt  LMOVM for pt to contact [facility] and get appt.  Any questions, please call us here.

## 2017-08-21 DIAGNOSIS — J320 Chronic maxillary sinusitis: Secondary | ICD-10-CM | POA: Insufficient documentation

## 2017-08-21 DIAGNOSIS — J310 Chronic rhinitis: Secondary | ICD-10-CM

## 2017-08-21 DIAGNOSIS — R0683 Snoring: Secondary | ICD-10-CM

## 2017-08-21 DIAGNOSIS — J353 Hypertrophy of tonsils with hypertrophy of adenoids: Secondary | ICD-10-CM

## 2017-08-21 DIAGNOSIS — G4733 Obstructive sleep apnea (adult) (pediatric): Secondary | ICD-10-CM | POA: Insufficient documentation

## 2017-08-21 HISTORY — DX: Chronic rhinitis: J31.0

## 2017-08-21 HISTORY — DX: Snoring: R06.83

## 2017-08-21 HISTORY — DX: Hypertrophy of tonsils with hypertrophy of adenoids: J35.3

## 2017-08-21 HISTORY — DX: Chronic maxillary sinusitis: J32.0

## 2017-08-23 ENCOUNTER — Encounter: Payer: Self-pay | Admitting: Radiology

## 2018-01-01 DIAGNOSIS — Z3041 Encounter for surveillance of contraceptive pills: Secondary | ICD-10-CM | POA: Diagnosis not present

## 2018-01-01 DIAGNOSIS — Z0000 Encounter for general adult medical examination without abnormal findings: Secondary | ICD-10-CM | POA: Diagnosis not present

## 2018-01-01 DIAGNOSIS — Z13220 Encounter for screening for lipoid disorders: Secondary | ICD-10-CM | POA: Diagnosis not present

## 2018-01-01 DIAGNOSIS — Z1329 Encounter for screening for other suspected endocrine disorder: Secondary | ICD-10-CM | POA: Diagnosis not present

## 2018-01-01 DIAGNOSIS — Z130 Encounter for screening for diseases of the blood and blood-forming organs and certain disorders involving the immune mechanism: Secondary | ICD-10-CM | POA: Diagnosis not present

## 2018-01-24 ENCOUNTER — Encounter: Payer: Self-pay | Admitting: Physician Assistant

## 2018-02-13 ENCOUNTER — Ambulatory Visit: Payer: 59 | Admitting: Physician Assistant

## 2018-02-13 ENCOUNTER — Other Ambulatory Visit: Payer: Self-pay

## 2018-02-13 ENCOUNTER — Encounter: Payer: Self-pay | Admitting: Physician Assistant

## 2018-02-13 VITALS — BP 120/62 | HR 42 | Temp 99.1°F | Resp 18 | Ht 66.0 in | Wt 223.6 lb

## 2018-02-13 DIAGNOSIS — R059 Cough, unspecified: Secondary | ICD-10-CM

## 2018-02-13 DIAGNOSIS — K625 Hemorrhage of anus and rectum: Secondary | ICD-10-CM

## 2018-02-13 DIAGNOSIS — K649 Unspecified hemorrhoids: Secondary | ICD-10-CM

## 2018-02-13 DIAGNOSIS — J329 Chronic sinusitis, unspecified: Secondary | ICD-10-CM

## 2018-02-13 DIAGNOSIS — R05 Cough: Secondary | ICD-10-CM

## 2018-02-13 DIAGNOSIS — R0683 Snoring: Secondary | ICD-10-CM

## 2018-02-13 DIAGNOSIS — J0140 Acute pansinusitis, unspecified: Secondary | ICD-10-CM

## 2018-02-13 HISTORY — DX: Chronic sinusitis, unspecified: J32.9

## 2018-02-13 LAB — POCT CBC
Granulocyte percent: 48.5 % (ref 37–80)
HCT, POC: 38.6 % (ref 37.7–47.9)
Hemoglobin: 12.6 g/dL (ref 12.2–16.2)
Lymph, poc: 2.6 (ref 0.6–3.4)
MCH, POC: 29.8 pg (ref 27–31.2)
MCHC: 32.7 g/dL (ref 31.8–35.4)
MCV: 91.1 fL (ref 80–97)
MID (cbc): 0.4 (ref 0–0.9)
MPV: 9.2 fL (ref 0–99.8)
POC Granulocyte: 2.8 (ref 2–6.9)
POC LYMPH PERCENT: 45.3 %L (ref 10–50)
POC MID %: 6.2 %M (ref 0–12)
Platelet Count, POC: 231 10*3/uL (ref 142–424)
RBC: 4.24 M/uL (ref 4.04–5.48)
RDW, POC: 12.8 %
WBC: 5.8 10*3/uL (ref 4.6–10.2)

## 2018-02-13 MED ORDER — AMOXICILLIN-POT CLAVULANATE 875-125 MG PO TABS: 1.0000 | ORAL_TABLET | Freq: Two times a day (BID) | ORAL | 0 refills | Status: DC

## 2018-02-13 MED ORDER — HYDROCODONE-HOMATROPINE 5-1.5 MG/5ML PO SYRP: 5.0000 mL | ORAL_SOLUTION | Freq: Three times a day (TID) | ORAL | 0 refills | Status: DC | PRN

## 2018-02-13 MED ORDER — FLUTICASONE PROPIONATE 50 MCG/ACT NA SUSP: 2.0000 | Freq: Every day | NASAL | 12 refills | Status: DC

## 2018-02-13 MED ORDER — HYDROCORTISONE ACETATE 25 MG RE SUPP: 25.0000 mg | Freq: Two times a day (BID) | RECTAL | 0 refills | Status: DC

## 2018-02-13 MED ORDER — BENZONATATE 100 MG PO CAPS: 100.0000 mg | ORAL_CAPSULE | Freq: Three times a day (TID) | ORAL | 0 refills | Status: DC | PRN

## 2018-02-13 NOTE — Patient Instructions (Addendum)
You will receive a cal to schedule an appointment with GI and sleep studies.  Start taking a stitz bath (see below) for the next 5-7 days. If you are still having problems with hemorrhoids, use the Anucort suppository.   How to Take a Sitz Bath A sitz bath is a warm water bath that is taken while you are sitting down. The water should only come up to your hips and should cover your buttocks. Your health care provider may recommend a sitz bath to help you:  Clean the lower part of your body, including your genital area.  With itching.  With pain.  With sore muscles or muscles that tighten or spasm.  How to take a sitz bath Take 3-4 sitz baths per day or as told by your health care provider. 1. Partially fill a bathtub with warm water. You will only need the water to be deep enough to cover your hips and buttocks when you are sitting in it. 2. If your health care provider told you to put medicine in the water, follow the directions exactly. 3. Sit in the water and open the tub drain a little. 4. Turn on the warm water again to keep the tub at the correct level. Keep the water running constantly. 5. Soak in the water for 15-20 minutes or as told by your health care provider. 6. After the sitz bath, pat the affected area dry first. Do not rub it. 7. Be careful when you stand up after the sitz bath because you may feel dizzy.  Contact a health care provider if:  Your symptoms get worse. Do not continue with sitz baths if your symptoms get worse.  You have new symptoms. Do not continue with sitz baths until you talk with your health care provider. This information is not intended to replace advice given to you by your health care provider. Make sure you discuss any questions you have with your health care provider. Document Released: 07/02/2004 Document Revised: 03/09/2016 Document Reviewed: 10/08/2014 Elsevier Interactive Patient Education  2018 [employer].   For constipation  Look  up "6 Reasons to Care about [place]" at [place] (WindowBlog.ch)  1) Water: Make sure you are drinking enough water daily - about 1-3 liters. 2) Fiber: Make sure you are getting enough fiber in your diet - this will make you regular - you can eat high fiber foods or use metamucil as a supplement - it is really important to drink enough water when using fiber supplements. Foods that have a lot of fiber include vegetables, fruits, beans, nuts, oatmeal, and some breads and cereals. You can tell how much fiber is in a food by reading the nutrition label. Doctors recommend eating 25 to 36 grams of fiber each day. 3) Fitness: Increasing your physical activity will help increase the natural movement of your bowels. Try to get 20-30 minutes of exercise daily.  4) Use a 9" stool in front of your toilet to rest your feet on while you have a bowel movement. This will loosen your rectal muscles to help your stool come out easier and prevent straining.   If your stools are hard or are formed balls or you have to strain a stool softener will help - use colace 2-3 capsule a day  For gentle treatment of constipation. Use Miralax 1-2 capfuls a day until your stools are soft and regular and then decrease the usage - you can use this daily   Hemorrhoids Hemorrhoids are swollen  veins in and around the rectum or anus. There are two types of hemorrhoids:  Internal hemorrhoids. These occur in the veins that are just inside the rectum. They may poke through to the outside and become irritated and painful.  External hemorrhoids. These occur in the veins that are outside of the anus and can be felt as a painful swelling or hard lump near the anus.  Most hemorrhoids do not cause serious problems, and they can be managed with home treatments such as diet and lifestyle changes. If home treatments do not help your symptoms, procedures can be done to shrink or remove the  hemorrhoids. What are the causes? This condition is caused by increased pressure in the anal area. This pressure may result from various things, including:  Constipation.  Straining to have a bowel movement.  Diarrhea.  Pregnancy.  Obesity.  Sitting for long periods of time.  Heavy lifting or other activity that causes you to strain.  Anal sex.  What are the signs or symptoms? Symptoms of this condition include:  Pain.  Anal itching or irritation.  Rectal bleeding.  Leakage of stool (feces).  Anal swelling.  One or more lumps around the anus.  How is this diagnosed? This condition can often be diagnosed through a visual exam. Other exams or tests may also be done, such as:  Examination of the rectal area with a gloved hand (digital rectal exam).  Examination of the anal canal using a small tube (anoscope).  A blood test, if you have lost a significant amount of blood.  A test to look inside the colon (sigmoidoscopy or colonoscopy).  How is this treated? This condition can usually be treated at home. However, various procedures may be done if dietary changes, lifestyle changes, and other home treatments do not help your symptoms. These procedures can help make the hemorrhoids smaller or remove them completely. Some of these procedures involve surgery, and others do not. Common procedures include:  Rubber band ligation. Rubber bands are placed at the base of the hemorrhoids to cut off the blood supply to them.  Sclerotherapy. Medicine is injected into the hemorrhoids to shrink them.  Infrared coagulation. A type of light energy is used to get rid of the hemorrhoids.  Hemorrhoidectomy surgery. The hemorrhoids are surgically removed, and the veins that supply them are tied off.  Stapled hemorrhoidopexy surgery. A circular stapling device is used to remove the hemorrhoids and use staples to cut off the blood supply to them.  Follow these instructions at  home: Eating and drinking  Eat foods that have a lot of fiber in them, such as whole grains, beans, nuts, fruits, and vegetables. Ask your health care provider about taking products that have added fiber (fiber supplements).  Drink enough fluid to keep your urine clear or pale yellow. Managing pain and swelling  Take warm sitz baths for 20 minutes, 3-4 times a day to ease pain and discomfort.  If directed, apply ice to the affected area. Using ice packs between sitz baths may be helpful. ? Put ice in a plastic bag. ? Place a towel between your skin and the bag. ? Leave the ice on for 20 minutes, 2-3 times a day. General instructions  Take over-the-counter and prescription medicines only as told by your health care provider.  Use medicated creams or suppositories as told.  Exercise regularly.  Go to the bathroom when you have the urge to have a bowel movement. Do not wait.  Avoid straining  to have bowel movements.  Keep the anal area dry and clean. Use wet toilet paper or moist towelettes after a bowel movement.  Do not sit on the toilet for long periods of time. This increases blood pooling and pain. Contact a health care provider if:  You have increasing pain and swelling that are not controlled by treatment or medicine.  You have uncontrolled bleeding.  You have difficulty having a bowel movement, or you are unable to have a bowel movement.  You have pain or inflammation outside the area of the hemorrhoids. This information is not intended to replace advice given to you by your health care provider. Make sure you discuss any questions you have with your health care provider. Document Released: 10/07/2000 Document Revised: 03/09/2016 Document Reviewed: 06/24/2015 Elsevier Interactive Patient Education  [employer].   I am treating you today for sinusitis.  Start taking Augmentin twice daily for 7 days.  Consider using a Neti Pot (you can buy this at your  pharmacy). If not, try the saline sinus rinse.  Take Mucinex D for the next 3-4 days. Drink plenty of water while taking this medication. Water will help thin out your mucus.  Flonase nasal spray - use this twice daily.  Come back if you are not better in 10-14 days.   General information regarding sinusitis:  ACUTE VIRAL RHINOSINUSITIS - Patients with acute viral rhinosinusitis (AVRS) should be managed with supportive care. There are no treatments to shorten the clinical course of the disease.  Natural history - AVRS may not completely resolve within 10 days but is expected to improve. Patients who fail to improve after ?10 days of symptomatic management are more likely to have acute bacterial rhinosinusitis. Symptomatic therapies - Symptomatic management of acute rhinosinusitis (ARS) aims to relieve symptoms of nasal obstruction and runny nose as well as the systemic signs and symptoms such as fever and fatigue. When needed, we suggest over-the-counter (OTC) analgesics and antipyretics, saline irrigation, and intranasal glucocorticoids for symptomatic management in patients with ARS. Analgesics and antipyretics - OTC analgesics and antipyretics such as nonsteroidal anti-inflammatory drugs and acetaminophen can be used for pain and fever relief as needed. Saline irrigation - Mechanical irrigation with saline may reduce the need for pain medication and improve overall patient comfort, particularly in patients with frequent sinus infections. It is important that irrigants be prepared from sterile or bottled water. (See below for instructions)  Intranasal glucocorticoids -  Intranasal glucocorticoids are likely to be most beneficial for patients with underlying allergies. This allows improved sinus drainage. A higher dose of intranasal glucocorticoids had a stronger effect on symptom improvement. -Other- ?Oral decongestants - Oral decongestants may be useful when eustachian tube dysfunction is a factor  for patients with AVRS. These patients may benefit from a short course (three to five days) of oral decongestants. Oral decongestants should be used with caution in patients with cardiovascular disease, hypertension, angle-closure glaucoma, or bladder neck obstruction. ?Intranasal decongestants - Intranasal decongestants are often used as symptomatic therapies by patients. These agents, such as oxymetazoline, may provide a subjective sense of improved nasal patency. There is also concern that intranasal decongestants themselves may provoke mucosal inflammation. If used, topical decongestants should be used sparingly for no more than three consecutive days to avoid rebound congestion, addiction, and mucosal damage associated with long-term use. ?Antihistamines - Antihistamines are frequently used for symptom relief due to their drying effects; however, there are no studies investigating their efficacy for ARS. Over-drying of the  mucosa may lead to further discomfort. Additionally, antihistamines are often associated with adverse effects.  ?Mucolytics - Mucolytics such as guaifenesin serve to thin secretions and may promote ease of mucus drainage and clearance.   SALINE NASAL IRRIGATION  The benefits  1. Saline (saltwater) washes the mucus and irritants from your nose.  2. The sinus passages are moisturized.  3. Studies have also shown that a nasal irrigation improves cell function (the cells that move the mucus work better).  The recipe  Use a one-quart glass jar that is thoroughly cleansed.  You may use a large medical syringe (30 cc), water pick with an irrigation tip (preferred method), squeeze bottle, or Neti pot. Do not use a baby bulb syringe. The syringe or pick should be sterilized frequently or replaced every two to three weeks to avoid contamination and infection.  Fill with water that has been distilled, previously boiled, or otherwise sterilized. Plain tap water is not recommended, because  it is not necessarily sterile.  Add 1 to 1 heaping teaspoons of pickling/canning salt. Do not use table salt, because it contains a large number of additives.  Add 1 teaspoon of baking soda (pure bicarbonate).  Mix ingredients together, and store at room temperature. Discard after one week.  You may also make up a solution from premixed packets that are commercially prepared specifically for nasal irrigation.  The instructions  Irrigate your nose with saline one to two times per day.   If you have been told to use nasal medication, you should always use your saline solution first. The nasal medication is much more effective when sprayed onto clean nasal membranes, and the spray will reach deeper into the nose.   Pour the amount of fluid you plan to use into a clean bowl. Do not put your used syringe back into the storage container, because it contaminates your solution.   You may warm the solution slightly in the microwave, but be sure that the solution is not hot.   Bend over the sink (some people do this in the shower), and squirt the solution into each side of your nose, aiming the stream toward the back of your head, not the top of your head. The solution should flow into one nostril and out of the other, but it will not harm you if you swallow a little.   Some people experience a little burning sensation the first few times that they use buffered saline solution, but this usually goes away after they adapt to it.     Thank you for coming in today. I hope you feel we met your needs. Feel free to call PCP if you have any questions or further requests. Please consider signing up for MyChart if you do not already have it, as this is a great way to communicate with me.  Best,  Whitney McVey, PA-C   IF you received an x-ray today, you will receive an invoice from [facility]. Please contact [facility] at [phone] with questions or concerns regarding your invoice.    IF you received labwork today, you will receive an invoice from [place]. Please contact [facility] at [phone] with questions or concerns regarding your invoice.   Our billing staff will not be able to assist you with questions regarding bills from these companies.  You will be contacted with the lab results as soon as they are available. The fastest way to get your results is to activate your My Chart account. Instructions are located on  the last page of this paperwork. If you have not heard from [place] regarding the results in 2 weeks, please contact this office.

## 2018-02-13 NOTE — Progress Notes (Signed)
Christy Holland  MRN: [id number] DOB: June 05, 1971  PCP: Patient, No Pcp Per  Subjective:  Pt is a 47 year old female who presents to clinic for multiple complaints.   1) Hemorrhoids x 1 week.  For the past three days a she has noticed blood with wiping. No blood in stool. Then she noticed bleeding during the day when not using the restroom. Blood "had that tissue like stuff with it".  No pain with walking. Pain with sitting sometimes.  She has tried otc Preparation H suppositories and external cream - this helped reduce size of hemorrhoids.  No straining with BM. Soft stools. She is drinking plenty of water, eating fiber and exercises regularly.  She has had problems with hemorrhoids as a teen, but never this bad.   Never had colonoscopy.  Denies lightheadedness, dizziness, syncope.   2) Recent illness: current sinus pressure x 3 weeks with nasal drainage. Lots of coughing with this. Cough is worse at night. Cough is so bad she loses her breath.  Takes allergy medication daily.   3) Snoring: worsening. + fatigue during the day. + HA. She has never been evaluated by sleep studies. Would like referral.   Review of Systems  Constitutional: Negative for chills, diaphoresis, fatigue and fever.  HENT: Positive for congestion, rhinorrhea, sinus pressure and sinus pain. Negative for ear discharge, ear pain, facial swelling, postnasal drip and sore throat.   Respiratory: Positive for cough. Negative for shortness of breath and wheezing.   Gastrointestinal: Positive for anal bleeding. Negative for constipation and rectal pain.  Neurological: Negative for dizziness, light-headedness and headaches.  Psychiatric/Behavioral: Negative for sleep disturbance.    There are no active problems to display for this patient.   Current Outpatient Medications on File Prior to Visit  Medication Sig Dispense Refill  . fluticasone (FLONASE) 50 MCG/ACT nasal spray Place 2 sprays into both nostrils daily.  (Patient not taking: Reported on 02/13/2018) 16 g 12  . HYDROcodone-homatropine (HYCODAN) 5-1.5 MG/5ML syrup Take 5 mLs by mouth every 8 (eight) hours as needed for cough. (Patient not taking: Reported on 07/31/2017) 120 mL 0  . levofloxacin (LEVAQUIN) 500 MG tablet Take 1 tablet (500 mg total) by mouth daily. (Patient not taking: Reported on 07/31/2017) 7 tablet 0   No current facility-administered medications on file prior to visit.     Allergies  Allergen Reactions  . Sulfa Antibiotics      Objective:  BP 120/62   Pulse (!) 42   Temp 99.1 F (37.3 C) (Oral)   Resp 18   Ht 5\' 6"  (1.676 m)   Wt 223 lb 9.6 oz (101.4 kg)   LMP 01/20/2018   SpO2 98%   BMI 36.09 kg/m   Physical Exam  Constitutional: She is oriented to person, place, and time. No distress.  Cardiovascular: Normal rate, regular rhythm and normal heart sounds.  Genitourinary:     Neurological: She is alert and oriented to person, place, and time.  Skin: Skin is warm and dry.  Psychiatric: Judgment normal.  Vitals reviewed.   Results for orders placed or performed in visit on 02/13/18  POCT CBC  Result Value Ref Range   WBC 5.8 4.6 - 10.2 K/uL   Lymph, poc 2.6 0.6 - 3.4   POC LYMPH PERCENT 45.3 10 - 50 %L   MID (cbc) 0.4 0 - 0.9   POC MID % 6.2 0 - 12 %M   POC Granulocyte 2.8 2 - 6.9   Granulocyte percent  48.5 37 - 80 %G   RBC 4.24 4.04 - 5.48 M/uL   Hemoglobin 12.6 12.2 - 16.2 g/dL   HCT, POC 16.138.6 09.637.7 - 47.9 %   MCV 91.1 80 - 97 fL   MCH, POC 29.8 27 - 31.2 pg   MCHC 32.7 31.8 - 35.4 g/dL   RDW, POC 04.512.8 %   Platelet Count, POC 231 142 - 424 K/uL   MPV 9.2 0 - 99.8 fL   Procedure: Verbal consent obtained. Skin was anesthetized with 1 cc lidocaine with epi. A 1 cm incision was made. A small amount of thrombosed material removed with hemostats. Hemorrhoid found to extend into rectum. Wound care discussed.  Assessment and Plan :  1. Hemorrhoids, unspecified hemorrhoid type 2. Bright red blood per  rectum - POCT CBC - Ambulatory referral to Gastroenterology - hydrocortisone (ANUSOL-HC) 25 MG suppository; Place 1 suppository (25 mg total) rectally 2 (two) times daily.  Dispense: 12 suppository; Refill: 0 - Pt presents c/o BRBPR. PE reveals two thrombosed hemorrhoids. Unsuccessful hemorrhoidectomy as involvement appears to continue into rectum. Plan to refer to GI for evaluation. Advised sitz baths.  3. Subacute pansinusitis 4. Cough - amoxicillin-clavulanate (AUGMENTIN) 875-125 MG tablet; Take 1 tablet by mouth 2 (two) times daily.  Dispense: 14 tablet; Refill: 0 - fluticasone (FLONASE) 50 MCG/ACT nasal spray; Place 2 sprays into both nostrils daily.  Dispense: 16 g; Refill: 12 - benzonatate (TESSALON) 100 MG capsule; Take 1-2 capsules (100-200 mg total) by mouth 3 (three) times daily as needed for cough.  Dispense: 40 capsule; Refill: 0 - HYDROcodone-homatropine (HYCODAN) 5-1.5 MG/5ML syrup; Take 5 mLs by mouth every 8 (eight) hours as needed for cough.  Dispense: 120 mL; Refill: 0 - Pt c/o worsening cough and sinus pain x 3 weeks. Plan to cover with Augmentin. Supportive care discussed.  5. Snoring - Ambulatory referral to Sleep Studies - Pt c/o worsening snoring + fatigue. Plan to refer for sleep study.   Marco CollieWhitney Alyscia Carmon, PA-C  Primary Care at [facility] 02/13/2018 12:30 PM

## 2018-03-22 ENCOUNTER — Encounter: Payer: Self-pay | Admitting: Physician Assistant

## 2019-02-28 ENCOUNTER — Encounter: Payer: Self-pay | Admitting: Cardiology

## 2019-03-01 ENCOUNTER — Other Ambulatory Visit: Payer: Self-pay

## 2019-03-01 ENCOUNTER — Encounter: Payer: Self-pay | Admitting: Cardiology

## 2019-03-01 ENCOUNTER — Ambulatory Visit (INDEPENDENT_AMBULATORY_CARE_PROVIDER_SITE_OTHER): Payer: 59 | Admitting: Cardiology

## 2019-03-01 VITALS — Ht 67.0 in | Wt 220.0 lb

## 2019-03-01 DIAGNOSIS — I493 Ventricular premature depolarization: Secondary | ICD-10-CM

## 2019-03-01 NOTE — Progress Notes (Signed)
Virtual Visit via Video Note   Subjective:   Christy BlalockValeria Holland, female    DOB: 14-Jun-1971, 48 y.o.   MRN: [id number]   I connected with the patient on 03/01/19 by a video enabled telemedicine application and verified that I am speaking with the correct person using two identifiers.     I discussed the limitations of evaluation and management by telemedicine and the availability of in person appointments. The patient expressed understanding and agreed to proceed.   This visit type was conducted due to national recommendations for restrictions regarding the COVID-19 Pandemic (e.g. social distancing).  This format is felt to be most appropriate for this patient at this time.  All issues noted in this document were discussed and addressed.  No physical exam was performed (except for noted visual exam findings with Tele health visits).  The patient has consented to conduct a Tele health visit and understands insurance will be billed.    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   I had recommended echocardiogram to evaluate systolic function, 24 hr Holter monitor to assess PVC burden, and started on metoprolol tartarate 12.5 mg twice daily. Echocardiogram showed EF 55%, mod LA dilatation, with 36 ventricular ectopy burden on 24 Hr Holter monitor. Unfortunately, patient did not follow up after that. She has not been been out of metoprolol for a long time.   She continues to have occasional episodes of palpitations and feeling tired and fatigued. She has occasional left sided chest tightness, at rest, usually not with exertion. She has noticed decreased hair growth and wonders if she has any problem with her circulation.    Past Medical History:  Diagnosis Date  . Frequent PVCs    History reviewed. No pertinent surgical history.   Social History   Socioeconomic History  . Marital  status: Married    Spouse name: Not on file  . Number of children: 3  . Years of education: Not on file  . Highest education level: Not on file  Occupational History  . Not on file  Social Needs  . Financial resource strain: Not on file  . Food insecurity:    Worry: Not on file    Inability: Not on file  . Transportation needs:    Medical: Not on file    Non-medical: Not on file  Tobacco Use  . Smoking status: Never Smoker  . Smokeless tobacco: Never Used  Substance and Sexual Activity  . Alcohol use: No    Alcohol/week: 0.0 standard drinks  . Drug use: No  . Sexual activity: Yes    Birth control/protection: Condom  Lifestyle  . Physical activity:    Days per week: Not on file    Minutes per session: Not on file  . Stress: Not on file  Relationships  . Social connections:    Talks on phone: Not on file    Gets together: Not on file    Attends religious service: Not on file    Active member of club or organization: Not on file    Attends meetings of clubs or organizations: Not on file    Relationship status: Not on file  . Intimate partner violence:    Fear of current or ex partner: Not on file    Emotionally abused: Not on file    Physically abused: Not on file    Forced sexual activity: Not on  file  Other Topics Concern  . Not on file  Social History Narrative  . Not on file     Family History  Problem Relation Age of Onset  . Hypertension Mother   . Hyperlipidemia Mother   . Diabetes Mother   . Hypertension Father   . Hyperlipidemia Father   . Diabetes Father      Current Outpatient Medications on File Prior to Visit  Medication Sig Dispense Refill  . Multiple Vitamin (MULTIVITAMIN) capsule Take 1 capsule by mouth daily.     No current facility-administered medications on file prior to visit.     Cardiovascular studies:  Holter monitor 08/21/2017: Recording time.  47 hour 59 minutes.Total ventricular ectopic beats 95,134 (36%) Longest 4 beats.   Fastest 176 bpm. Bi/Trigeminy 54621/17515  Echocardiogram 09/28/2017: Left ventricle cavity is normal in size. Normal global wall motion. Accurate assessment of LVEF limited due to frequent PVC's. EF probably normal around 55%. Normal diastolic filling pattern. Calculated EF 55%. Left atrial cavity is moderately dilated. Mild tricuspid regurgitation. Estimated pulmonary artery systolic pressure 25 mmHg.  Recent labs: 07/31/2017: H/H 12/37.  MCV normal.  Platelets 204. BUN/creatinine 14/0.7.  Sodium 137, potassium 4.5. TSH normal  Review of Systems  Constitution: Positive for malaise/fatigue. Negative for decreased appetite, weight gain and weight loss.  HENT: Negative for congestion.   Eyes: Negative for visual disturbance.  Cardiovascular: Positive for chest pain and dyspnea on exertion. Negative for leg swelling, palpitations and syncope.  Respiratory: Negative for shortness of breath.   Endocrine: Negative for cold intolerance.  Hematologic/Lymphatic: Does not bruise/bleed easily.  Skin: Negative for itching and rash.  Musculoskeletal: Negative for myalgias.  Gastrointestinal: Negative for abdominal pain, nausea and vomiting.  Genitourinary: Negative for dysuria.  Neurological: Negative for dizziness and weakness.  Psychiatric/Behavioral: The patient is not nervous/anxious.   All other systems reviewed and are negative.        Vitals not available.  Objective:    Physical Exam  Constitutional: She is oriented to person, place, and time. She appears well-developed and well-nourished. No distress.  Pulmonary/Chest: Effort normal.  Neurological: She is alert and oriented to person, place, and time.  Psychiatric: She has a normal mood and affect.  Nursing note and vitals reviewed.         Assessment & Recommendations:   48 year old African-American female w/frequent PVC's  Frequent PVC's: Previously, notes to have 36% PVC burden on Holter monitor. Resume  metoprolol tartarate 12.5 mg bid. Recommend nuclear stress test to evaluate for ischemia, repeat holter monitor and echocardiogram. I will see her after the tests to discuss further management options.    Elder Negus, MD [facility] Pager: [phone] Office: [phone] If no answer Cell [phone]

## 2019-03-04 ENCOUNTER — Encounter: Payer: Self-pay | Admitting: Gastroenterology

## 2019-03-04 ENCOUNTER — Other Ambulatory Visit: Payer: Self-pay

## 2019-03-04 ENCOUNTER — Ambulatory Visit (INDEPENDENT_AMBULATORY_CARE_PROVIDER_SITE_OTHER): Payer: Self-pay | Admitting: Gastroenterology

## 2019-03-04 DIAGNOSIS — K649 Unspecified hemorrhoids: Secondary | ICD-10-CM

## 2019-03-04 NOTE — Progress Notes (Signed)
Telehealth encounter started for 9am appointment. Patient did not respond to voice calls or texts. I tried again at 35 without success. Marking today's appointment as a no show.

## 2019-03-05 ENCOUNTER — Other Ambulatory Visit: Payer: Self-pay

## 2019-03-05 MED ORDER — METOPROLOL TARTRATE 25 MG PO TABS: 25.0000 mg | ORAL_TABLET | Freq: Every day | ORAL | 1 refills | Status: DC

## 2019-03-25 ENCOUNTER — Other Ambulatory Visit: Payer: 59

## 2019-04-10 ENCOUNTER — Encounter: Payer: Self-pay | Admitting: Neurology

## 2019-04-10 ENCOUNTER — Other Ambulatory Visit: Payer: Self-pay

## 2019-04-10 ENCOUNTER — Ambulatory Visit (INDEPENDENT_AMBULATORY_CARE_PROVIDER_SITE_OTHER): Payer: 59 | Admitting: Neurology

## 2019-04-10 VITALS — BP 142/84 | HR 44 | Temp 97.8°F | Ht 67.0 in | Wt 217.0 lb

## 2019-04-10 DIAGNOSIS — Z820 Family history of epilepsy and other diseases of the nervous system: Secondary | ICD-10-CM

## 2019-04-10 DIAGNOSIS — G4719 Other hypersomnia: Secondary | ICD-10-CM | POA: Diagnosis not present

## 2019-04-10 DIAGNOSIS — R519 Headache, unspecified: Secondary | ICD-10-CM

## 2019-04-10 DIAGNOSIS — I493 Ventricular premature depolarization: Secondary | ICD-10-CM

## 2019-04-10 DIAGNOSIS — R0683 Snoring: Secondary | ICD-10-CM | POA: Diagnosis not present

## 2019-04-10 DIAGNOSIS — E669 Obesity, unspecified: Secondary | ICD-10-CM | POA: Diagnosis not present

## 2019-04-10 DIAGNOSIS — G4700 Insomnia, unspecified: Secondary | ICD-10-CM

## 2019-04-10 DIAGNOSIS — R0681 Apnea, not elsewhere classified: Secondary | ICD-10-CM

## 2019-04-10 DIAGNOSIS — G479 Sleep disorder, unspecified: Secondary | ICD-10-CM

## 2019-04-10 DIAGNOSIS — R51 Headache: Secondary | ICD-10-CM

## 2019-04-10 DIAGNOSIS — R001 Bradycardia, unspecified: Secondary | ICD-10-CM

## 2019-04-10 DIAGNOSIS — R351 Nocturia: Secondary | ICD-10-CM

## 2019-04-10 NOTE — Progress Notes (Signed)
Subjective:    Patient ID: Christy BlalockValeria Spanbauer is a 48 y.o. female.  HPI     Huston FoleySaima Miachel Nardelli, MD, PhD [facility] [address], [address] [address] [place], [place] [id number]  Dear Dr. Billy Coastaavon,   I saw your patient, Christy Holland, upon your kind request in the sleep clinic today for initial consultation of her sleep disorder, in particular, concern for underlying obstructive sleep apnea.  The patient is unaccompanied today.  As you know, Ms. Christy Holland is a 48 year old right-handed woman with an underlying medical history of rhinitis, recurrent sinusitis, PVCs, and obesity, who reports snoring and excessive daytime somnolence.  I reviewed your office note from 03/04/2019, which you kindly included.  Her Epworth sleepiness score is 13 out of 24, fatigue severity score is 39 out of 63.  She has had some difficulty initiating and maintaining sleep.  She often wakes up in the middle of the night and has difficulty falling back asleep, she also wakes up in the early morning hours sometimes and has difficulty sleeping through the night for different reasons.  She does have witnessed apneas per husband's report and loud snoring.  She has woken herself up with a sense of gasping for air and does have morning headaches which are described as dull and achy.  Her sister has sleep apnea and her mother has sleep apnea, both have CPAP machines.  Her weight has fluctuated.  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  She lives at home with her husband and 3 children, oldest is 2217, youngest is almost 4212.  They have no pets.  She is a non-smoker and does not drink alcohol, does not drink caffeine daily.  She has had PVCs and bradycardia.  She is scheduled to have an exercise tolerance test next week.  She follows with cardiology.  She does not wake up rested.  She has nocturia on average twice per night.  Her Past Medical History Is Significant For: Past Medical History:  Diagnosis  Date  . Frequent PVCs     Her Past Surgical History Is Significant For: History reviewed. No pertinent surgical history.  Her Family History Is Significant For: Family History  Problem Relation Age of Onset  . Hypertension Mother   . Hyperlipidemia Mother   . Diabetes Mother   . Hypertension Father   . Hyperlipidemia Father   . Diabetes Father   . Heart attack Father 6570  . Stroke Father     Her Social History Is Significant For: Social History   Socioeconomic History  . Marital status: Married    Spouse name: Not on file  . Number of children: 3  . Years of education: Not on file  . Highest education level: Not on file  Occupational History  . Not on file  Social Needs  . Financial resource strain: Not on file  . Food insecurity    Worry: Not on file    Inability: Not on file  . Transportation needs    Medical: Not on file    Non-medical: Not on file  Tobacco Use  . Smoking status: Never Smoker  . Smokeless tobacco: Never Used  Substance and Sexual Activity  . Alcohol use: No    Alcohol/week: 0.0 standard drinks  . Drug use: No  . Sexual activity: Yes    Birth control/protection: Condom  Lifestyle  . Physical activity    Days per week: Not on file    Minutes per session:  Not on file  . Stress: Not on file  Relationships  . Social Musicianconnections    Talks on phone: Not on file    Gets together: Not on file    Attends religious service: Not on file    Active member of club or organization: Not on file    Attends meetings of clubs or organizations: Not on file    Relationship status: Not on file  Other Topics Concern  . Not on file  Social History Narrative  . Not on file    Her Allergies Are:  Allergies  Allergen Reactions  . Sulfa Antibiotics   :   Her Current Medications Are:  Outpatient Encounter Medications as of 04/10/2019  Medication Sig  . metoprolol tartrate (LOPRESSOR) 25 MG tablet Take 1 tablet (25 mg total) by mouth daily.  . Multiple  Vitamin (MULTIVITAMIN) capsule Take 1 capsule by mouth daily.  . Vitamin D, Ergocalciferol, (DRISDOL) 1.25 MG (50000 UT) CAPS capsule Take 50,000 Units by mouth every 7 (seven) days.   No facility-administered encounter medications on file as of 04/10/2019.   :  Review of Systems:  Out of a complete 14 point review of systems, all are reviewed and negative with the exception of these symptoms as listed below:      Review of Systems  Neurological:       Pt presents today to discuss her sleep. Pt has never had a sleep study but does endorse snoring.  Epworth Sleepiness Scale 0= would never doze 1= slight chance of dozing 2= moderate chance of dozing 3= high chance of dozing  Sitting and reading: 2 Watching TV: 2 Sitting inactive in a public place (ex. Theater or meeting): 2 As a passenger in a car for an hour without a break: 1 Lying down to rest in the afternoon: 3 Sitting and talking to someone: 1 Sitting quietly after lunch (no alcohol): 2 In a car, while stopped in traffic: 0 Total: 13     Objective:  Neurological Exam  Physical Exam Physical Examination:   Vitals:   04/10/19 0903  BP: (!) 142/84  Pulse: (!) 44  Temp: 97.8 F (36.6 C)    General Examination: The patient is a very pleasant 48 y.o. female in no acute distress. She appears well-developed and well-nourished and well groomed.   HEENT: Normocephalic, atraumatic, pupils are equal, round and reactive to light and accommodation. Funduscopic exam is normal with sharp disc margins noted. Extraocular tracking is good without limitation to gaze excursion or nystagmus noted. Normal smooth pursuit is noted. Hearing is grossly intact. Tympanic membranes are clear bilaterally. Face is symmetric with normal facial animation and normal facial sensation. Speech is clear with no dysarthria noted. There is no hypophonia. There is no lip, neck/head, jaw or voice tremor. Neck is supple with full range of passive and  active motion. There are no carotid bruits on auscultation. Oropharynx exam reveals: mild mouth dryness, good dental hygiene and mild airway crowding, due to elongated tongue. Mallampati is class I. Tongue protrudes centrally and palate elevates symmetrically. Tonsils are 1+ in size, uvula small. Neck size is 15 7/8 inches. She has a Mild overbite.  Chest: Clear to auscultation without wheezing, rhonchi or crackles noted.  Heart: S1+S2+0, regular and normal without murmurs, rubs or gallops noted.   Abdomen: Soft, non-tender and non-distended with normal bowel sounds appreciated on auscultation.  Extremities: There is no pitting edema in the distal lower extremities bilaterally. Pedal pulses are intact.  Skin:  Warm and dry without trophic changes noted. There are no varicose veins.  Musculoskeletal: exam reveals no obvious joint deformities, tenderness or joint swelling or erythema.   Neurologically:  Mental status: The patient is awake, alert and oriented in all 4 spheres. Her immediate and remote memory, attention, language skills and fund of knowledge are appropriate. There is no evidence of aphasia, agnosia, apraxia or anomia. Speech is clear with normal prosody and enunciation. Thought process is linear. Mood is normal and affect is normal.  Cranial nerves II - XII are as described above under HEENT exam. In addition: shoulder shrug is normal with equal shoulder height noted. Motor exam: Normal bulk, strength and tone is noted. There is no drift, tremor or rebound. Romberg is negative. Reflexes are 1+ throughout. Fine motor skills and coordination: intact with normal finger taps, normal hand movements, normal rapid alternating patting, normal foot taps and normal foot agility.  Cerebellar testing: No dysmetria or intention tremor on finger to nose testing. Heel to shin is unremarkable bilaterally. There is no truncal or gait ataxia.  Sensory exam: intact to light touch in the upper and lower  extremities.  Gait, station and balance: She stands easily. No veering to one side is noted. No leaning to one side is noted. Posture is age-appropriate and stance is narrow based. Gait shows normal stride length and normal pace. No problems turning are noted. Tandem walk is unremarkable.   Assessment and Plan:  In summary, Caiden Monsivais is a very pleasant 48 y.o.-year old female with an underlying medical history of rhinitis, recurrent sinusitis, PVCs, and obesity, whose history and physical exam are concerning for obstructive sleep apnea (OSA). I had a long chat with the patient about my findings and the diagnosis of OSA, its prognosis and treatment options. We talked about medical treatments, surgical interventions and non-pharmacological approaches. I explained in particular the risks and ramifications of untreated moderate to severe OSA, especially with respect to developing cardiovascular disease down the Road, including congestive heart failure, difficult to treat hypertension, cardiac arrhythmias, or stroke. Even type 2 diabetes has, in part, been linked to untreated OSA. Symptoms of untreated OSA include daytime sleepiness, memory problems, mood irritability and mood disorder such as depression and anxiety, lack of energy, as well as recurrent headaches, especially morning headaches. We talked about trying to maintain a healthy lifestyle in general, as well as the importance of weight control. I encouraged the patient to eat healthy, exercise daily and keep well hydrated, to keep a scheduled bedtime and wake time routine, to not skip any meals and eat healthy snacks in between meals. I advised the patient not to drive when feeling sleepy.  I recommended the following at this time: sleep study.   I explained the sleep test procedure to the patient and also outlined possible surgical and non-surgical treatment options of OSA, including the use of a custom-made dental device (which would require a  referral to a specialist dentist or oral surgeon), upper airway surgical options, such as pillar implants, radiofrequency surgery, tongue base surgery, and UPPP (which would involve a referral to an ENT surgeon). Rarely, jaw surgery such as mandibular advancement may be considered.  I also explained the CPAP treatment option to the patient, who indicated that she would be willing to try CPAP if the need arises. I explained the importance of being compliant with PAP treatment, not only for insurance purposes but primarily to improve Her symptoms, and for the patient's long term health benefit, including  to reduce Her cardiovascular risks. I answered all her questions today and the patient was in agreement. I would like to see her back after the sleep study is completed and encouraged her to call with any interim questions, concerns, problems or updates.   Thank you very much for allowing me to participate in the care of this nice patient. If I can be of any further assistance to you please do not hesitate to call me at [phone].  Sincerely,   Huston FoleySaima Lota Leamer, MD, PhD

## 2019-04-10 NOTE — Patient Instructions (Addendum)
Thank you for choosing [facility] for your sleep related care! It was nice to meet you today! I appreciate that you entrust me with your sleep related healthcare concerns. I hope, I was able to address at least some of your concerns today, and that I can help you feel reassured and also get better.    Here is what we discussed today and what we came up with as our plan for you:    1. You can try Melatonin at night for sleep: take 1 mg to 3 mg, one to 2 hours before your bedtime. You can go up to 5 mg if needed. It is over the counter and comes in pill form, chewable form and spray, if you prefer.    2. Based on your symptoms, your family history and your exam I believe you are at risk for obstructive sleep apnea (aka OSA), and I think we should proceed with a sleep study to determine whether you do or do not have OSA and how severe it is. Even, if you have mild OSA, I may want you to consider treatment with CPAP, as treatment of even borderline or mild sleep apnea can result and improvement of symptoms such as sleep disruption, daytime sleepiness, nighttime bathroom breaks, restless leg symptoms, improvement of headache syndromes, even improved mood disorder.   Please remember, the long-term risks and ramifications of untreated moderate to severe obstructive sleep apnea are: increased Cardiovascular disease, including congestive heart failure, stroke, difficult to control hypertension, treatment resistant obesity, arrhythmias, especially irregular heartbeat commonly known as A. Fib. (atrial fibrillation); even type 2 diabetes has been linked to untreated OSA.   Sleep apnea can cause disruption of sleep and sleep deprivation in most cases, which, in turn, can cause recurrent headaches, problems with memory, mood, concentration, focus, and vigilance. Most people with untreated sleep apnea report excessive daytime sleepiness, which can affect their ability to drive. Please do not drive  if you feel sleepy. Patients with sleep apnea developed difficulty initiating and maintaining sleep (aka insomnia).   Having sleep apnea may increase your risk for other sleep disorders, including involuntary behaviors sleep such as sleep terrors, sleep talking, sleepwalking.    Having sleep apnea can also increase your risk for restless leg syndrome and leg movements at night.   Please note that untreated obstructive sleep apnea may carry additional perioperative morbidity. Patients with significant obstructive sleep apnea (typically, in the moderate to severe degree) should receive, if possible, perioperative PAP (positive airway pressure) therapy and the surgeons and particularly the anesthesiologists should be informed of the diagnosis and the severity of the sleep disordered breathing.   I will likely see you back after your sleep study to go over the test results and where to go from there. We will call you after your sleep study to advise about the results (most likely, you will hear from [place], my nurse) and to set up an appointment at the time, as necessary.    Our sleep lab administrative assistant will call you to schedule your sleep study and give you further instructions, regarding the check in process for the sleep study, arrival time, what to bring, when you can expect to leave after the study, etc., and to answer any other logistical questions you may have. If you don't hear back from her by about 2 weeks from now, please feel free to call her direct line at [phone] or you can call our general clinic number, or email [place] through  My Chart.

## 2019-04-12 ENCOUNTER — Ambulatory Visit: Payer: 59 | Admitting: Cardiology

## 2019-04-12 ENCOUNTER — Other Ambulatory Visit: Payer: 59

## 2019-04-15 ENCOUNTER — Other Ambulatory Visit: Payer: Self-pay

## 2019-04-15 ENCOUNTER — Ambulatory Visit: Payer: 59

## 2019-04-15 ENCOUNTER — Other Ambulatory Visit: Payer: 59

## 2019-04-15 DIAGNOSIS — R002 Palpitations: Secondary | ICD-10-CM | POA: Diagnosis not present

## 2019-04-15 DIAGNOSIS — I493 Ventricular premature depolarization: Secondary | ICD-10-CM

## 2019-04-18 ENCOUNTER — Other Ambulatory Visit: Payer: Self-pay

## 2019-04-18 ENCOUNTER — Ambulatory Visit: Payer: 59 | Admitting: Cardiology

## 2019-04-18 ENCOUNTER — Ambulatory Visit (INDEPENDENT_AMBULATORY_CARE_PROVIDER_SITE_OTHER): Payer: 59

## 2019-04-18 DIAGNOSIS — I493 Ventricular premature depolarization: Secondary | ICD-10-CM

## 2019-04-22 NOTE — Progress Notes (Signed)
Tried to call pt no VM set up

## 2019-04-22 NOTE — Progress Notes (Signed)
Please schedule f/u per MP

## 2019-04-29 ENCOUNTER — Other Ambulatory Visit: Payer: 59

## 2019-04-29 ENCOUNTER — Ambulatory Visit: Payer: 59 | Admitting: Cardiology

## 2019-05-08 ENCOUNTER — Ambulatory Visit (INDEPENDENT_AMBULATORY_CARE_PROVIDER_SITE_OTHER): Payer: 59 | Admitting: Neurology

## 2019-05-08 DIAGNOSIS — I493 Ventricular premature depolarization: Secondary | ICD-10-CM

## 2019-05-08 DIAGNOSIS — R0681 Apnea, not elsewhere classified: Secondary | ICD-10-CM

## 2019-05-08 DIAGNOSIS — R519 Headache, unspecified: Secondary | ICD-10-CM

## 2019-05-08 DIAGNOSIS — G4719 Other hypersomnia: Secondary | ICD-10-CM

## 2019-05-08 DIAGNOSIS — R001 Bradycardia, unspecified: Secondary | ICD-10-CM

## 2019-05-08 DIAGNOSIS — G4700 Insomnia, unspecified: Secondary | ICD-10-CM

## 2019-05-08 DIAGNOSIS — E669 Obesity, unspecified: Secondary | ICD-10-CM

## 2019-05-08 DIAGNOSIS — Z820 Family history of epilepsy and other diseases of the nervous system: Secondary | ICD-10-CM

## 2019-05-08 DIAGNOSIS — G4733 Obstructive sleep apnea (adult) (pediatric): Secondary | ICD-10-CM

## 2019-05-08 DIAGNOSIS — G479 Sleep disorder, unspecified: Secondary | ICD-10-CM

## 2019-05-08 DIAGNOSIS — R351 Nocturia: Secondary | ICD-10-CM

## 2019-05-08 DIAGNOSIS — R0683 Snoring: Secondary | ICD-10-CM

## 2019-05-13 ENCOUNTER — Ambulatory Visit: Payer: 59

## 2019-05-13 ENCOUNTER — Other Ambulatory Visit: Payer: Self-pay

## 2019-05-16 ENCOUNTER — Telehealth: Payer: Self-pay

## 2019-05-16 NOTE — Telephone Encounter (Signed)
I called pt. I advised pt that Dr. Rexene Alberts reviewed their sleep study results and found that pt has mild osa. Dr. Rexene Alberts recommends that pt start an auto pap at home. I reviewed PAP compliance expectations with the pt. Pt is agreeable to starting an auto-PAP. I advised pt that an order will be sent to a DME, [facility], and Huey Romans will call the pt within about one week after they file with the pt's insurance. Huey Romans will show the pt how to use the machine, fit for masks, and troubleshoot the auto-PAP if needed. A follow up appt was made for insurance purposes with Dr. Rexene Alberts on 08/07/2019 at 8:30am. Pt verbalized understanding to arrive 15 minutes early and bring their auto-PAP. A letter with all of this information in it will be mailed to the pt as a reminder. I verified with the pt that the address we have on file is correct. Pt verbalized understanding of results. Pt had no questions at this time but was encouraged to call back if questions arise. I have sent the order to [place] and have received confirmation that they have received the order.

## 2019-05-16 NOTE — Addendum Note (Signed)
Addended by: Star Age on: 05/16/2019 08:33 AM   Modules accepted: Orders

## 2019-05-16 NOTE — Procedures (Signed)
Patient Information     First Name: Christy MonsValeria Last Name: Wendi Holland ID: [id number]  Birth Date: Jun 24, 1971 Age: 48 Gender: Female  Referring Provider: Olivia Mackieaavon, Richard, MD BMI: 34.3 (W=218 lb, H=5' 7'')  Neck Circ.:  16 '' Epworth:  13/24   Sleep Study Information    Study Date: May 08, 2019 S/H/A Version: 5.1.77.7 / 4.1.1528 / 6577  History:     48 year old woman with a history of rhinitis, recurrent sinusitis, PVCs, and obesity, who reports snoring and excessive daytime somnolence.  Summary & Diagnosis:      OSA Recommendations:      This home sleep test demonstrates mild obstructive sleep apnea with a total AHI of 6.6/hour and O2 nadir of 85%. Given the patient's medical history and sleep related complaints, treatment with positive airway pressure is a reasonable option and can be achieved in the form of autoPAP titration/trial at home. A proper titration study with CPAP may be helpful or needed down the road, when considered safe. Please note, that untreated obstructive sleep apnea may carry additional perioperative morbidity. Patients with significant obstructive sleep apnea should receive perioperative PAP therapy and the surgeons and particularly the anesthesiologist should be informed of the diagnosis and the severity of the sleep disordered breathing. Patient will be reminded regarding compliance with her PAP machine and to be mindful of cleanliness with the equipment and timely with supply changes (i.e. changing filter, mask, hose, humidifier chamber on an ongoing basis as recommended, and cleaning parts that touch the face and nose daily, etc). The patient should be cautioned not to drive, work at heights, or operate dangerous or heavy equipment when tired or sleepy. Review and reiteration of good sleep hygiene measures should be pursued with any patient. Other treatment options include weight loss along with avoiding the supine sleep position, or a dental device.  Other causes of the  patient's symptoms, including circadian rhythm disturbances, an underlying mood disorder, medication effect and/or an underlying medical problem cannot be ruled out based on this test. Clinical correlation is recommended. The patient and her referring provider will be notified of the test results. The patient will be [facility] in [facility], either for a face-to-face or virtual visit, whichever feasible and recommended at the time.  I certify that I have reviewed the raw data recording prior to the issuance of this report in accordance with the standards of the American Academy of Sleep Medicine (AASM).  Huston FoleySaima Donicia Druck, MD, PhD [facility] Diplomat, ABPN (Neurology and Sleep)             Sleep Summary    Oxygen Saturation Statistics     Start Study Time: End Study Time: Total Recording Time:  10:29:42 PM   8:33:43 AM 10 h, 4 min  Total Sleep Time % REM of Sleep Time:  7 h, 30 min  32.4    Mean: 97 Minimum: 85 Maximum: 100  Mean of Desaturations Nadirs (%):   93  Oxygen Desaturation. %:   4-9 10-20 >20 Total  Events Number Total    17  2 89.5 10.5  0 0.0  19 100.0  Oxygen Saturation: <90 <=88 <85 <80 <70  Duration (minutes): Sleep % 0.3 0.1  0.2 0.0  0.0 0.0 0.0 0.0 0.0 0.0     Respiratory Indices      Total Events REM NREM All Night  pRDI:  86  pAHI:  44 ODI:  19  pAHIc:  3  % CSR: 0.0 21.6 11.9 4.6 1.1 9.9 4.7 2.3 0.2 13.0 6.6 2.9 0.5       Pulse Rate Statistics during Sleep (BPM)      Mean:  63 Minimum: N/A Maximum: 111    Indices are calculated using technically valid sleep time of  6 hrs, 37 min. pRDI/pAHI are calculated using oxi desaturations ? 3%  Body Position Statistics  Position Supine Prone Right Left Non-Supine  Sleep (min) 283.0 7.5 151.0 9.0 167.5  Sleep % 62.8 1.7 33.5 2.0 37.2  pRDI 14.8 N/A 7.9 N/A 9.9  pAHI 8.3 N/A 3.2 N/A 3.7  ODI 3.8 N/A 0.5 N/A 1.2     Snoring Statistics  Snoring Level (dB) >40 >50 >60 >70 >80 >Threshold (45)  Sleep (min) 77.1 4.7 1.0 0.0 0.0 18.0  Sleep % 17.1 1.0 0.2 0.0 0.0 4.0    Mean: 41 dB Sleep Stages Chart

## 2019-05-16 NOTE — Telephone Encounter (Signed)
-----   Message from Star Age, MD sent at 05/16/2019  8:32 AM EDT ----- Patient referred by Dr. Ronita Hipps, seen by me on 04/10/19, HST on 05/08/19.    Please call and notify the patient that the recent home sleep test showed obstructive sleep apnea. OSA is overall mild, but worth treating to see if she feels better after treatment. To that end I recommend treatment for this in the form of autoPAP, which means, that we don't have to bring her in for a sleep study with CPAP, but will let her try an autoPAP machine at home, through a DME company (of her choice, or as per insurance requirement). The DME representative will educate her on how to use the machine, how to put the mask on, etc. I have placed an order in the chart. Please send referral, talk to patient, send report to referring MD. We will need a FU in sleep clinic for 10 weeks post-PAP set up, please arrange that with me or one of our NPs. Thanks,   Star Age, MD, PhD [facility])

## 2019-05-16 NOTE — Progress Notes (Signed)
Patient referred by Dr. Ronita Hipps, seen by me on 04/10/19, HST on 05/08/19.    Please call and notify the patient that the recent home sleep test showed obstructive sleep apnea. OSA is overall mild, but worth treating to see if she feels better after treatment. To that end I recommend treatment for this in the form of autoPAP, which means, that we don't have to bring her in for a sleep study with CPAP, but will let her try an autoPAP machine at home, through a DME company (of her choice, or as per insurance requirement). The DME representative will educate her on how to use the machine, how to put the mask on, etc. I have placed an order in the chart. Please send referral, talk to patient, send report to referring MD. We will need a FU in sleep clinic for 10 weeks post-PAP set up, please arrange that with me or one of our NPs. Thanks,   Star Age, MD, PhD [facility])

## 2019-05-20 ENCOUNTER — Other Ambulatory Visit: Payer: Self-pay

## 2019-05-20 ENCOUNTER — Encounter: Payer: Self-pay | Admitting: Cardiology

## 2019-05-20 ENCOUNTER — Ambulatory Visit (INDEPENDENT_AMBULATORY_CARE_PROVIDER_SITE_OTHER): Payer: 59 | Admitting: Cardiology

## 2019-05-20 DIAGNOSIS — I493 Ventricular premature depolarization: Secondary | ICD-10-CM | POA: Diagnosis not present

## 2019-05-20 MED ORDER — METOPROLOL TARTRATE 25 MG PO TABS: 25.0000 mg | ORAL_TABLET | Freq: Every day | ORAL | 1 refills | Status: DC

## 2019-05-20 NOTE — Progress Notes (Signed)
Virtual Visit via Video Note   Subjective:   Christy Holland, female    DOB: Jul 07, 1971, 48 y.o.   MRN: [id number]   I connected with the patient on 05/20/2019 by a video enabled telemedicine application and verified that I am speaking with the correct person using two identifiers.     I discussed the limitations of evaluation and management by telemedicine and the availability of in person appointments. The patient expressed understanding and agreed to proceed.   This visit type was conducted due to national recommendations for restrictions regarding the COVID-19 Pandemic (e.g. social distancing).  This format is felt to be most appropriate for this patient at this time.  All issues noted in this document were discussed and addressed.  No physical exam was performed (except for noted visual exam findings with Tele health visits).  The patient has consented to conduct a Tele health visit and understands insurance will be billed.   Chief complaint:  PVC's  HPI  47 year old African-American female w/frequent PVC's.  Patient has resumed metoprolol 12.5 mg bid. Repeat Holter monitor continued to show high PVC burden at 25%. Echocardiogram shows preserved LVEF.  She does not have any symptoms from this currently/. She denies chest pain, shortness of breath, palpitations, leg edema, orthopnea, PND, TIA/syncope.    Past Medical History:  Diagnosis Date  . Frequent PVCs    No past surgical history on file.   Social History   Socioeconomic History  . Marital status: Married    Spouse name: Not on file  . Number of children: 3  . Years of education: Not on file  . Highest education level: Not on file  Occupational History  . Not on file  Social Needs  . Financial resource strain: Not on file  . Food insecurity    Worry: Not on file    Inability: Not on file  . Transportation needs    Medical: Not on file    Non-medical: Not on file  Tobacco Use  . Smoking status: Never  Smoker  . Smokeless tobacco: Never Used  Substance and Sexual Activity  . Alcohol use: No    Alcohol/week: 0.0 standard drinks  . Drug use: No  . Sexual activity: Yes    Birth control/protection: Condom  Lifestyle  . Physical activity    Days per week: Not on file    Minutes per session: Not on file  . Stress: Not on file  Relationships  . Social Herbalist on phone: Not on file    Gets together: Not on file    Attends religious service: Not on file    Active member of club or organization: Not on file    Attends meetings of clubs or organizations: Not on file    Relationship status: Not on file  . Intimate partner violence    Fear of current or ex partner: Not on file    Emotionally abused: Not on file    Physically abused: Not on file    Forced sexual activity: Not on file  Other Topics Concern  . Not on file  Social History Narrative  . Not on file     Family History  Problem Relation Age of Onset  . Hypertension Mother   . Hyperlipidemia Mother   . Diabetes Mother   . Hypertension Father   . Hyperlipidemia Father   . Diabetes Father   . Heart attack Father 59  . Stroke Father  Current Outpatient Medications on File Prior to Visit  Medication Sig Dispense Refill  . metoprolol tartrate (LOPRESSOR) 25 MG tablet Take 1 tablet (25 mg total) by mouth daily. 90 tablet 1  . Multiple Vitamin (MULTIVITAMIN) capsule Take 1 capsule by mouth daily.    . Vitamin D, Ergocalciferol, (DRISDOL) 1.25 MG (50000 UT) CAPS capsule Take 50,000 Units by mouth every 7 (seven) days.     No current facility-administered medications on file prior to visit.     Cardiovascular studies:  Holter monitor report 04/18/2019: Dominant rhythm sinus.  Frequent ventricular ectopy seen-largley unifocal.  Longest ventricular ectopy lasting 4 beats.  Ventricular ectopy burden 25.6% No other arrhythmia seen.   Echocardiogram 04/18/2019: Normal LV systolic function with EF 55%.  Left ventricle cavity is normal in size. Normal global wall motion. Indeterminate diastolic filling pattern. Left atrial cavity is moderately dilated. Mild (Grade I) mitral regurgitation. Mild tricuspid regurgitation. No evidence of pulmonary hypertension. No significant change compared to previous study on 09/28/2017.  Holter monitor 08/21/2017: Recording time.  47 hour 59 minutes.Total ventricular ectopic beats 95,134 (36%) Longest 4 beats.  Fastest 176 bpm. Bi/Trigeminy 54621/[id number]  Echocardiogram 09/28/2017: Left ventricle cavity is normal in size. Normal global wall motion. Accurate assessment of LVEF limited due to frequent PVC's. EF probably normal around 55%. Normal diastolic filling pattern. Calculated EF 55%. Left atrial cavity is moderately dilated. Mild tricuspid regurgitation. Estimated pulmonary artery systolic pressure 25 mmHg.  Recent labs: 07/31/2017: H/H 12/37.  MCV normal.  Platelets 204. BUN/creatinine 14/0.7.  Sodium 137, potassium 4.5. TSH normal  Review of Systems  Constitution: Positive for malaise/fatigue. Negative for decreased appetite, weight gain and weight loss.  HENT: Negative for congestion.   Eyes: Negative for visual disturbance.  Cardiovascular: Positive for chest pain and dyspnea on exertion. Negative for leg swelling, palpitations and syncope.  Respiratory: Negative for shortness of breath.   Endocrine: Negative for cold intolerance.  Hematologic/Lymphatic: Does not bruise/bleed easily.  Skin: Negative for itching and rash.  Musculoskeletal: Negative for myalgias.  Gastrointestinal: Negative for abdominal pain, nausea and vomiting.  Genitourinary: Negative for dysuria.  Neurological: Negative for dizziness and weakness.  Psychiatric/Behavioral: The patient is not nervous/anxious.   All other systems reviewed and are negative.          Objective:    Physical Exam  Constitutional: She is oriented to person, place, and time. She appears  well-developed and well-nourished. No distress.  Pulmonary/Chest: Effort normal.  Neurological: She is alert and oriented to person, place, and time.  Psychiatric: She has a normal mood and affect.  Nursing note and vitals reviewed.         Assessment & Recommendations:   48 year old African-American female w/frequent PVC's  Frequent PVC's: Asymptomatic, but with 25% PVC burden, longest run 4 beats. Preserved LVEF on echocardiogram. PVC burden >15-20% over a long period could cause arrhythmia induced cardiomyopathy. PVC ablation could be an option in symptomatic patient.s Since she is currently asymptomatic, will increase metoprolol tartrate to 25 mg bid. I will repeat 48 Hr Holter monitor and see her for follow up visit.    Elder NegusManish J Patwardhan, MD [facility] Pager: [phone] Office: [phone] If no answer Cell [phone]

## 2019-07-29 ENCOUNTER — Other Ambulatory Visit: Payer: Self-pay

## 2019-07-29 ENCOUNTER — Ambulatory Visit: Payer: Self-pay

## 2019-07-29 DIAGNOSIS — I493 Ventricular premature depolarization: Secondary | ICD-10-CM

## 2019-08-06 NOTE — Telephone Encounter (Signed)
I called [facility]; no cpap data on this pt is available. Spoke with Crystal. She reports that they are out of network with pt's insurance.  I called pt to discuss changing her appt and trying a new DME. No answer, no VM set up.

## 2019-08-07 ENCOUNTER — Ambulatory Visit (INDEPENDENT_AMBULATORY_CARE_PROVIDER_SITE_OTHER): Payer: 59 | Admitting: Neurology

## 2019-08-07 ENCOUNTER — Other Ambulatory Visit: Payer: Self-pay

## 2019-08-07 ENCOUNTER — Encounter: Payer: Self-pay | Admitting: Neurology

## 2019-08-07 VITALS — BP 162/94 | HR 71 | Temp 97.4°F | Ht 67.0 in | Wt 240.0 lb

## 2019-08-07 DIAGNOSIS — R519 Headache, unspecified: Secondary | ICD-10-CM | POA: Diagnosis not present

## 2019-08-07 DIAGNOSIS — G4733 Obstructive sleep apnea (adult) (pediatric): Secondary | ICD-10-CM | POA: Diagnosis not present

## 2019-08-07 DIAGNOSIS — I493 Ventricular premature depolarization: Secondary | ICD-10-CM | POA: Diagnosis not present

## 2019-08-07 DIAGNOSIS — R030 Elevated blood-pressure reading, without diagnosis of hypertension: Secondary | ICD-10-CM | POA: Diagnosis not present

## 2019-08-07 NOTE — Progress Notes (Signed)
Subjective:    Patient ID: Christy Holland is a 48 y.o. female.  HPI     Interim history:   Christy Holland is a 48 year old right-handed woman with an underlying medical history of rhinitis, recurrent sinusitis, PVCs, and obesity, who presents for follow-up consultation of her obstructive sleep apnea after recent home sleep testing.  The patient is unaccompanied today.  I first met her on 04/10/2019 at the request of her primary care physician, at which time she reported snoring and excessive daytime somnolence with an Epworth sleepiness score of 13 at the time.  She was advised to proceed with a sleep study.  She had a home sleep test on 05/08/2019 which indicated overall mild sleep apnea with a total AHI of 6.6/hour and O2 nadir of 85%.  She was advised to start AutoPap therapy to see if her symptoms improve particularly her daytime somnolence and her snoring.  She did not get set up with AutoPap therapy due to change in insurance. Today, 08/07/2019: She reports that she does not sleep well.  She has had intermittent headaches which are throbbing.  She has a remote history of migraines.  She has some tension type headaches that are more global and tightness-like.  She has tried Tylenol and Motrin.  Her blood pressure is quite high today.  She has not had an eye examination in 2 or 3 years.  She has an appointment with the primary care physician.  She has had some intermittent chest tightness, no shortness of breath.  She has not seen a cardiologist.  She had a cardiac monitor and has been diagnosed with PVCs.  She is on metoprolol for this.  She does not check her blood pressure at home.  She would be willing to get started on AutoPap therapy.  She denies any sudden onset of one-sided weakness, numbness, tingling, slurring of speech or droopy face.  The patient's allergies, current medications, family history, past medical history, past social history, past surgical history and problem list were reviewed  and updated as appropriate.   Previously:   04/10/2019: (She) reports snoring and excessive daytime somnolence.  I reviewed your office note from 03/04/2019, which you kindly included.  Her Epworth sleepiness score is 13 out of 24, fatigue severity score is 39 out of 63.  She has had some difficulty initiating and maintaining sleep.  She often wakes up in the middle of the night and has difficulty falling back asleep, she also wakes up in the early morning hours sometimes and has difficulty sleeping through the night for different reasons.  She does have witnessed apneas per husband's report and loud snoring.  She has woken herself up with a sense of gasping for air and does have morning headaches which are described as dull and achy.  Her sister has sleep apnea and her mother has sleep apnea, both have CPAP machines.  Her weight has fluctuated.  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  She lives at home with her husband and 3 children, oldest is 38, youngest is almost 39.  They have no pets.  She is a non-smoker and does not drink alcohol, does not drink caffeine daily.  She has had PVCs and bradycardia.  She is scheduled to have an exercise tolerance test next week.  She follows with cardiology.  She does not wake up rested.  She has nocturia on average twice per night.  Her Past Medical History Is Significant For: Past Medical History:  Diagnosis Date  . Frequent PVCs     Her Past Surgical History Is Significant For: No past surgical history on file.  Her Family History Is Significant For: Family History  Problem Relation Age of Onset  . Hypertension Mother   . Hyperlipidemia Mother   . Diabetes Mother   . Hypertension Father   . Hyperlipidemia Father   . Diabetes Father   . Heart attack Father 30  . Stroke Father     Her Social History Is Significant For: Social History   Socioeconomic History  . Marital status: Married    Spouse name: Not on file  . Number of  children: 3  . Years of education: Not on file  . Highest education level: Not on file  Occupational History  . Not on file  Social Needs  . Financial resource strain: Not on file  . Food insecurity    Worry: Not on file    Inability: Not on file  . Transportation needs    Medical: Not on file    Non-medical: Not on file  Tobacco Use  . Smoking status: Never Smoker  . Smokeless tobacco: Never Used  Substance and Sexual Activity  . Alcohol use: No    Alcohol/week: 0.0 standard drinks  . Drug use: No  . Sexual activity: Yes    Birth control/protection: Condom  Lifestyle  . Physical activity    Days per week: Not on file    Minutes per session: Not on file  . Stress: Not on file  Relationships  . Social Herbalist on phone: Not on file    Gets together: Not on file    Attends religious service: Not on file    Active member of club or organization: Not on file    Attends meetings of clubs or organizations: Not on file    Relationship status: Not on file  Other Topics Concern  . Not on file  Social History Narrative  . Not on file    Her Allergies Are:  Allergies  Allergen Reactions  . Sulfa Antibiotics   :   Her Current Medications Are:  Outpatient Encounter Medications as of 08/07/2019  Medication Sig  . metoprolol tartrate (LOPRESSOR) 25 MG tablet Take 1 tablet (25 mg total) by mouth daily.  . Multiple Vitamin (MULTIVITAMIN) capsule Take 1 capsule by mouth daily.  . Vitamin D, Ergocalciferol, (DRISDOL) 1.25 MG (50000 UT) CAPS capsule Take 50,000 Units by mouth every 7 (seven) days.   No facility-administered encounter medications on file as of 08/07/2019.   :  Review of Systems:  Out of a complete 14 point review of systems, all are reviewed and negative with the exception of these symptoms as listed below: Review of Systems  Neurological:       Pt presents today to discuss starting her cpap. Pt was been unable to start cpap due to insurance  changes but is interested in starting it.    Objective:  Neurological Exam  Physical Exam Physical Examination:   Vitals:   08/07/19 0846  BP: (!) 162/94  Pulse: 71  Temp: (!) 97.4 F (36.3 C)    General Examination: The patient is a very pleasant 48 y.o. female in no acute distress. She appears well-developed and well-nourished and well groomed.   HEENT: Normocephalic, atraumatic, pupils are equal, round and reactive to light, No photophobia.  Extraocular tracking is well preserved, hearing is intact.  She has normal facial animation, speech is clear  without dysarthria, hypophonia or voice tremor.  Airway examination reveals mild to moderate mouth dryness, otherwise stable findings, mild airway crowding noted, Mallampati class I. She has a mild overbite.  Chest: Clear to auscultation without wheezing, rhonchi or crackles noted.  Heart: S1+S2+0, regular and normal without murmurs, rubs or gallops noted.   Abdomen: Soft, non-tender and non-distended with normal bowel sounds appreciated on auscultation.  Extremities: There is no pitting edema in the distal lower extremities bilaterally.   Skin: Warm and dry without trophic changes noted.  Musculoskeletal: exam reveals no obvious joint deformities, tenderness or joint swelling or erythema.   Neurologically:  Mental status: The patient is awake, alert and oriented in all 4 spheres. Her immediate and remote memory, attention, language skills and fund of knowledge are appropriate. There is no evidence of aphasia, agnosia, apraxia or anomia. Speech is clear with normal prosody and enunciation. Thought process is linear. Mood is normal and affect is normal.  Cranial nerves II - XII are as described above under HEENT exam. In addition: shoulder shrug is normal with equal shoulder height noted. Motor exam: Normal bulk, strength and tone is noted. There is no drift, tremor or rebound. Romberg is negative. Reflexes are 1+ throughout.  Fine motor skills and coordination: intact with normal finger taps, normal hand movements, normal rapid alternating patting, normal foot taps and normal foot agility.  Cerebellar testing: No dysmetria or intention tremor on finger to nose testing. Heel to shin is unremarkable bilaterally. There is no truncal or gait ataxia.  Sensory exam: intact to light touch in the upper and lower extremities.  Gait, station and balance: She stands easily. No veering to one side is noted. No leaning to one side is noted. Posture is age-appropriate and stance is narrow based. Gait shows normal stride length and normal pace. No problems turning are noted. Tandem walk is unremarkable.   Assessment and Plan:  In summary, Christy Holland is a very pleasant 48 year old female with an underlying medical history of rhinitis, recurrent sinusitis, PVCs, and obesity, who Presents for follow-up consultation of her mild obstructive sleep apnea as determined by a home sleep test recently.  She has not yet started AutoPap therapy but would be willing to get started.  We will send the order to a DME company that is in network with her current insurance.  We will see her in follow-up in about 3 months.  She reports intermittent headaches.  She has had some chest tightness and her blood pressure is quite elevated today.  She is advised to follow-up as soon as possible with her primary care physician regarding blood pressure management and chest pain.  She has seen cardiology in the past for PVCs.  She denies any TIA-like symptoms, she denies visual symptoms.  Nevertheless, since she has not seen an ophthalmologist or optometrist in the last 2 or 3 years she is advised to get a full examination of her eyes done as well.Neurological exam is thankfully nonfocal.  Nevertheless, she is advised to proceed to the emergency room and have someone take her or call 911 should she have any severe headache, one-sided weakness, numbness, tingling, droopy  face or slurring of speech.We plan to see her back in about 3 months with a nurse practitioner for follow-up of her sleep apnea. She has had some sleep difficulty.  She is advised to try melatonin.  We talked about headache triggers today which includes dehydration, sleep deprivation, stress, and elevated blood pressure values, history of  migraines.  She does have a remote history of migraines as a teenager.  I answered all her questions today and she was in agreement with the plan.

## 2019-08-07 NOTE — Patient Instructions (Addendum)
We will set you up at home (through a DME company) with a so called autoPAP machine for treatment of your overall mild obstructive sleep apnea.    You can try Melatonin at night for sleep: take 1 mg to 3 mg, one to 2 hours before your bedtime. You can go up to 5 mg if needed. It is over the counter and comes in pill form, chewable form and spray, if you prefer.    Please Make an appointment with your primary care physician for recheck on your blood pressure, as it is quite high, and to discuss recent chest tightness.  You are having recurrent headaches which could relate to high blood pressure, underlying migraine, tension, lack of sleep. Should you have any sudden onset of one-sided weakness, numbness, tingling, slurring of speech, shortness of breath, chest pain, or severe headache, changes in mental state, please proceed to the ER (have someone take you) or call 911 immediately.  Please follow-up in about 3 months with 1 of our nurse practitioners.

## 2019-08-07 NOTE — Progress Notes (Signed)
Order for new auto pap sent to [facility] via community message. Confirmation received that the order transmitted was successful.  

## 2019-08-08 ENCOUNTER — Ambulatory Visit: Payer: Self-pay | Admitting: Cardiology

## 2019-08-22 ENCOUNTER — Other Ambulatory Visit: Payer: Self-pay

## 2019-08-22 ENCOUNTER — Ambulatory Visit (INDEPENDENT_AMBULATORY_CARE_PROVIDER_SITE_OTHER): Payer: 59 | Admitting: Cardiology

## 2019-08-22 VITALS — BP 120/81 | HR 76

## 2019-08-22 DIAGNOSIS — R9431 Abnormal electrocardiogram [ECG] [EKG]: Secondary | ICD-10-CM

## 2019-08-22 DIAGNOSIS — I493 Ventricular premature depolarization: Secondary | ICD-10-CM | POA: Diagnosis not present

## 2019-08-22 HISTORY — DX: Abnormal electrocardiogram (ECG) (EKG): R94.31

## 2019-08-22 NOTE — Progress Notes (Signed)
Subjective:   Christy Holland, female    DOB: 04/30/1971, 48 y.o.   MRN: [id number]  Chief complaint:  PVC's  HPI  48 year old African-American female w/frequent PVC's.  Patient is currently taking metoprolol 25 mg bid. Repeat Holter monitor continued to show high PVC burden at 30%. Echocardiogram shows preserved LVEF.  She does not have any symptoms from this currently. She denies chest pain, shortness of breath, palpitations, leg edema, orthopnea, PND, TIA/syncope.    Past Medical History:  Diagnosis Date  . Frequent PVCs    No past surgical history on file.   Social History   Socioeconomic History  . Marital status: Married    Spouse name: Not on file  . Number of children: 3  . Years of education: Not on file  . Highest education level: Not on file  Occupational History  . Not on file  Social Needs  . Financial resource strain: Not on file  . Food insecurity    Worry: Not on file    Inability: Not on file  . Transportation needs    Medical: Not on file    Non-medical: Not on file  Tobacco Use  . Smoking status: Never Smoker  . Smokeless tobacco: Never Used  Substance and Sexual Activity  . Alcohol use: No    Alcohol/week: 0.0 standard drinks  . Drug use: No  . Sexual activity: Yes    Birth control/protection: Condom  Lifestyle  . Physical activity    Days per week: Not on file    Minutes per session: Not on file  . Stress: Not on file  Relationships  . Social Musician on phone: Not on file    Gets together: Not on file    Attends religious service: Not on file    Active member of club or organization: Not on file    Attends meetings of clubs or organizations: Not on file    Relationship status: Not on file  . Intimate partner violence    Fear of current or ex partner: Not on file    Emotionally abused: Not on file    Physically abused: Not on file    Forced sexual activity: Not on file  Other Topics Concern  . Not on file   Social History Narrative  . Not on file     Family History  Problem Relation Age of Onset  . Hypertension Mother   . Hyperlipidemia Mother   . Diabetes Mother   . Hypertension Father   . Hyperlipidemia Father   . Diabetes Father   . Heart attack Father 41  . Stroke Father      Current Outpatient Medications on File Prior to Visit  Medication Sig Dispense Refill  . metoprolol tartrate (LOPRESSOR) 25 MG tablet Take 1 tablet (25 mg total) by mouth daily. 180 tablet 1  . Multiple Vitamin (MULTIVITAMIN) capsule Take 1 capsule by mouth daily.    . Vitamin D, Ergocalciferol, (DRISDOL) 1.25 MG (50000 UT) CAPS capsule Take 50,000 Units by mouth every 7 (seven) days.     No current facility-administered medications on file prior to visit.     Cardiovascular studies:  48 Hr Holter monitor 07/29/2019: Dominant rhythm sinus. HR 56-145 bpm. Avg HR 91 bpm.  30% ventricular ectopy in the form of single VE's, couplets, triplets, and NSVT up to 5 beats. No sustained ventricular tachycardia.  Rare supraventricular ectopy. No other arrhyhtmia seen.   24 Hr Holter monitor report  04/18/2019: Dominant rhythm sinus.  Frequent ventricular ectopy seen-largley unifocal.  Longest ventricular ectopy lasting 4 beats.  Ventricular ectopy burden 25.6% No other arrhythmia seen.   Echocardiogram 04/18/2019: Normal LV systolic function with EF 55%. Left ventricle cavity is normal in size. Normal global wall motion. Indeterminate diastolic filling pattern. Left atrial cavity is moderately dilated. Mild (Grade I) mitral regurgitation. Mild tricuspid regurgitation. No evidence of pulmonary hypertension. No significant change compared to previous study on 09/28/2017.  Holter monitor 08/21/2017: Recording time.  47 hour 59 minutes.Total ventricular ectopic beats 95,134 (36%) Longest 4 beats.  Fastest 176 bpm. Bi/Trigeminy [id number]  Echocardiogram 09/28/2017: Left ventricle cavity is normal in  size. Normal global wall motion. Accurate assessment of LVEF limited due to frequent PVC's. EF probably normal around 55%. Normal diastolic filling pattern. Calculated EF 55%. Left atrial cavity is moderately dilated. Mild tricuspid regurgitation. Estimated pulmonary artery systolic pressure 25 mmHg.  Recent labs: 07/31/2017: H/H 12/37.  MCV normal.  Platelets 204. BUN/creatinine 14/0.7.  Sodium 137, potassium 4.5. TSH normal  Review of Systems  Constitution: Positive for malaise/fatigue. Negative for decreased appetite, weight gain and weight loss.  HENT: Negative for congestion.   Eyes: Negative for visual disturbance.  Cardiovascular: Positive for chest pain and dyspnea on exertion. Negative for leg swelling, palpitations and syncope.  Respiratory: Negative for shortness of breath.   Endocrine: Negative for cold intolerance.  Hematologic/Lymphatic: Does not bruise/bleed easily.  Skin: Negative for itching and rash.  Musculoskeletal: Negative for myalgias.  Gastrointestinal: Negative for abdominal pain, nausea and vomiting.  Genitourinary: Negative for dysuria.  Neurological: Negative for dizziness and weakness.  Psychiatric/Behavioral: The patient is not nervous/anxious.   All other systems reviewed and are negative.          Objective:    Physical Exam  Constitutional: She is oriented to person, place, and time. She appears well-developed and well-nourished. No distress.  Pulmonary/Chest: Effort normal.  Neurological: She is alert and oriented to person, place, and time.  Psychiatric: She has a normal mood and affect.  Nursing note and vitals reviewed.         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 Preserved LVEF on echocardiogram in 03/2019. Given high VC burden on well tolerated beta blocker dose, I will refer to EP for further  evaluation, t see ablation could be an option to reduce the risk of arrhythmia induced cardiomyopathy. I will also obtain exercise treadmill stress test to rule out ischemia, although suspicion is low.    Nigel Mormon, MD [facility] Pager: [phone] Office: [phone] If no answer Cell [phone]

## 2019-08-23 ENCOUNTER — Encounter: Payer: Self-pay | Admitting: Cardiology

## 2019-09-11 ENCOUNTER — Ambulatory Visit: Payer: 59 | Admitting: Cardiology

## 2019-09-26 ENCOUNTER — Ambulatory Visit (INDEPENDENT_AMBULATORY_CARE_PROVIDER_SITE_OTHER): Payer: 59 | Admitting: Internal Medicine

## 2019-09-26 ENCOUNTER — Encounter: Payer: Self-pay | Admitting: Internal Medicine

## 2019-09-26 ENCOUNTER — Other Ambulatory Visit: Payer: Self-pay

## 2019-09-26 VITALS — BP 100/66 | HR 84 | Ht 67.0 in | Wt 241.0 lb

## 2019-09-26 DIAGNOSIS — Z23 Encounter for immunization: Secondary | ICD-10-CM | POA: Diagnosis not present

## 2019-09-26 DIAGNOSIS — I493 Ventricular premature depolarization: Secondary | ICD-10-CM | POA: Diagnosis not present

## 2019-09-26 MED ORDER — FLECAINIDE ACETATE 50 MG PO TABS: 50.0000 mg | ORAL_TABLET | Freq: Two times a day (BID) | ORAL | 3 refills | Status: DC

## 2019-09-26 NOTE — Patient Instructions (Addendum)
Medication Instructions:  Your physician has recommended you make the following change in your medication:   1.  Start taking flecainide 50 mg-  Take one tablet by mouth twice a day  Labwork: None ordered.  Testing/Procedures: None ordered.  Follow-Up:  NURSE visit--October 09, 2019 at 8:30 am  Your physician wants you to follow-up with Dr. Taylor--November 14, 2019 at 12:30 pm  Any Other Special Instructions Will Be Listed Below (If Applicable).  If you need a refill on your cardiac medications before your next appointment, please call your pharmacy.   Flecainide tablets What is this medicine? FLECAINIDE (FLEK a nide) is an antiarrhythmic drug. This medicine is used to prevent irregular heart rhythm. It can also slow down fast heartbeats called tachycardia. This medicine may be used for other purposes; ask your health care provider or pharmacist if you have questions. COMMON BRAND NAME(S): Tambocor What should I tell my health care provider before I take this medicine? They need to know if you have any of these conditions:  abnormal levels of potassium in the blood  heart disease including heart rhythm and heart rate problems  kidney or liver disease  recent heart attack  an unusual or allergic reaction to flecainide, local anesthetics, other medicines, foods, dyes, or preservatives  pregnant or trying to get pregnant  breast-feeding How should I use this medicine? Take this medicine by mouth with a glass of water. Follow the directions on the prescription label. You can take this medicine with or without food. Take your doses at regular intervals. Do not take your medicine more often than directed. Do not stop taking this medicine suddenly. This may cause serious, heart-related side effects. If your doctor wants you to stop the medicine, the dose may be slowly lowered over time to avoid any side effects. Talk to your pediatrician regarding the use of this medicine in  children. While this drug may be prescribed for children as young as 1 year of age for selected conditions, precautions do apply. Overdosage: If you think you have taken too much of this medicine contact a poison control center or emergency room at once. NOTE: This medicine is only for you. Do not share this medicine with others. What if I miss a dose? If you miss a dose, take it as soon as you can. If it is almost time for your next dose, take only that dose. Do not take double or extra doses. What may interact with this medicine? Do not take this medicine with any of the following medications:  amoxapine  arsenic trioxide  certain antibiotics like clarithromycin, erythromycin, gatifloxacin, gemifloxacin, levofloxacin, moxifloxacin, sparfloxacin, or troleandomycin  certain antidepressants called tricyclic antidepressants like amitriptyline, imipramine, or nortriptyline  certain medicines to control heart rhythm like disopyramide, encainide, moricizine, procainamide, propafenone, and quinidine  cisapride  delavirdine  droperidol  haloperidol  hawthorn  imatinib  levomethadyl  maprotiline  medicines for malaria like chloroquine and halofantrine  pentamidine  phenothiazines like chlorpromazine, mesoridazine, prochlorperazine, thioridazine  pimozide  quinine  ranolazine  ritonavir  sertindole This medicine may also interact with the following medications:  cimetidine  dofetilide  medicines for angina or high blood pressure  medicines to control heart rhythm like amiodarone and digoxin  ziprasidone This list may not describe all possible interactions. Give your health care provider a list of all the medicines, herbs, non-prescription drugs, or dietary supplements you use. Also tell them if you smoke, drink alcohol, or use illegal drugs. Some items may interact with  your medicine. What should I watch for while using this medicine? Visit your doctor or health  care professional for regular checks on your progress. Because your condition and the use of this medicine carries some risk, it is a good idea to carry an identification card, necklace or bracelet with details of your condition, medications and doctor or health care professional. Check your blood pressure and pulse rate regularly. Ask your health care professional what your blood pressure and pulse rate should be, and when you should contact him or her. Your doctor or health care professional also may schedule regular blood tests and electrocardiograms to check your progress. You may get drowsy or dizzy. Do not drive, use machinery, or do anything that needs mental alertness until you know how this medicine affects you. Do not stand or sit up quickly, especially if you are an older patient. This reduces the risk of dizzy or fainting spells. Alcohol can make you more dizzy, increase flushing and rapid heartbeats. Avoid alcoholic drinks. What side effects may I notice from receiving this medicine? Side effects that you should report to your doctor or health care professional as soon as possible:  chest pain, continued irregular heartbeats  difficulty breathing  swelling of the legs or feet  trembling, shaking  unusually weak or tired Side effects that usually do not require medical attention (report to your doctor or health care professional if they continue or are bothersome):  blurred vision  constipation  headache  nausea, vomiting  stomach pain This list may not describe all possible side effects. Call your doctor for medical advice about side effects. You may report side effects to FDA at 1-800-FDA-1088. Where should I keep my medicine? Keep out of the reach of children. Store at room temperature between 15 and 30 degrees C (59 and 86 degrees F). Protect from light. Keep container tightly closed. Throw away any unused medicine after the expiration date. NOTE: This sheet is a  summary. It may not cover all possible information. If you have questions about this medicine, talk to your doctor, pharmacist, or health care provider.  2020 Elsevier/Gold Standard (2018-10-01 11:41:38)

## 2019-09-26 NOTE — Progress Notes (Signed)
HPI Christy Holland is referred today for evaluation of PVC"s. She is a pleasant 48 yo woman with a h/o PVC's referred for additional eval. She does not have much in the way of symptoms. Her PVC's were diagnosed by her primary MD and then she was evaluated by Dr. Virgina Jock and had 25-30% PVC"s in a 24 hour period. She has preserved LV function. She has never had syncope. She is not sure she can feel any symptoms. Review of the PVC's demonstrates a Right bundle pattern with transition at V3. AVR and AVL are negative and lead 1 is positive. Inferior leads are upright indicating a right but probably LVOT origin. Probably from the right coronary cusp.  Allergies  Allergen Reactions  . Sulfa Antibiotics      Current Outpatient Medications  Medication Sig Dispense Refill  . Multiple Vitamin (MULTIVITAMIN) capsule Take 1 capsule by mouth daily.    . Vitamin D, Ergocalciferol, (DRISDOL) 1.25 MG (50000 UT) CAPS capsule Take 50,000 Units by mouth every 7 (seven) days.    . metoprolol tartrate (LOPRESSOR) 25 MG tablet Take 1 tablet (25 mg total) by mouth daily. 180 tablet 1   No current facility-administered medications for this visit.      Past Medical History:  Diagnosis Date  . Chronic maxillary sinusitis 08/21/2017  . Frequent PVCs   . Holter monitor, abnormal 08/22/2019  . 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 Rhinitis, chronic 08/21/2017  . Sinusitis 02/13/2018  . Snoring 08/21/2017    ROS:   All systems reviewed and negative except as noted in the HPI.   No past surgical history on file.   Family History  Problem Relation Age of Onset  . Hypertension Mother   . Hyperlipidemia Mother   . Diabetes Mother   . Hypertension Father   . Hyperlipidemia Father   . Diabetes Father   . Heart attack Father 31  . Stroke Father      Social History   Socioeconomic History  . Marital status: Married    Spouse name: Not on file  . Number of children: 3  . Years of  education: Not on file  . Highest education level: Not on file  Occupational History  . Not on file  Social Needs  . Financial resource strain: Not on file  . Food insecurity    Worry: Not on file    Inability: Not on file  . Transportation needs    Medical: Not on file    Non-medical: Not on file  Tobacco Use  . Smoking status: Never Smoker  . Smokeless tobacco: Never Used  Substance and Sexual Activity  . Alcohol use: No    Alcohol/week: 0.0 standard drinks  . Drug use: No  . Sexual activity: Yes    Birth control/protection: Condom  Lifestyle  . Physical activity    Days per week: Not on file    Minutes per session: Not on file  . Stress: Not on file  Relationships  . Social Herbalist on phone: Not on file    Gets together: Not on file    Attends religious service: Not on file    Active member of club or organization: Not on file    Attends meetings of clubs or organizations: Not on file    Relationship status: Not on file  . Intimate partner violence    Fear of current or ex partner: Not on file    Emotionally  abused: Not on file    Physically abused: Not on file    Forced sexual activity: Not on file  Other Topics Concern  . Not on file  Social History Narrative  . Not on file     BP 100/66   Pulse 84   Ht 5\' 7"  (1.702 m)   Wt 241 lb (109.3 kg)   SpO2 95%   BMI 37.75 kg/m   Physical Exam:  Obese but Well appearing 48 yo woman, NAD HEENT: Unremarkable Neck:  6 cm JVD, no thyromegally Lymphatics:  No adenopathy Back:  No CVA tenderness Lungs:  Clear with no wheezes HEART:  Regular rate rhythm, no murmurs, no rubs, no clicks Abd:  soft, positive bowel sounds, no organomegally, no rebound, no guarding Ext:  2 plus pulses, no edema, no cyanosis, no clubbing Skin:  No rashes no nodules Neuro:  CN II through XII intact, motor grossly intact  EKG - nsr with PVC's as described above  Assess/Plan: 1. PVC"s - I discussed the treatment options  in detail. As she is asymptomatic and her EF has not fallen, I offered her a trial of medical therapy. I think she will likely develop a cardiomyopathy if the PVC's cannot be controlled. I also discussed in detail the option of catheter ablation. She would like to first try flecainide and if not effective, consider ablation. I started flecainide 50 bid and she will come back for a 12 lead ECG and once a therapeutic dose is reached, undergo exercise testing.  52.D.

## 2019-10-09 ENCOUNTER — Ambulatory Visit: Payer: 59

## 2019-10-16 ENCOUNTER — Ambulatory Visit (INDEPENDENT_AMBULATORY_CARE_PROVIDER_SITE_OTHER): Payer: 59

## 2019-10-16 ENCOUNTER — Other Ambulatory Visit: Payer: Self-pay

## 2019-10-16 VITALS — BP 100/60 | HR 80 | Wt 239.0 lb

## 2019-10-16 DIAGNOSIS — I493 Ventricular premature depolarization: Secondary | ICD-10-CM | POA: Diagnosis not present

## 2019-10-16 MED ORDER — FLECAINIDE ACETATE 50 MG PO TABS: 75.0000 mg | ORAL_TABLET | Freq: Two times a day (BID) | ORAL | 3 refills | Status: DC

## 2019-10-16 NOTE — Progress Notes (Signed)
1.) Reason for visit: follow up EKG after starting flecainide 50 mg bid  2.) Name of MD requesting visit: Dr. Lovena Le  3.) H&P: Pt started on flecainide for PVC's  4.) ROS related to problem: Pt has been on flecainide 50 mg bid for 2 weeks.  Per Pt she had some vision changes at start of taking medication but that has resolved.  Pt has never felt her PVC's.  5.) Assessment and plan per MD: Per review by Dr. Lovena Le of EKG-increase flecainide to 75 mg bid and repeat EKG in 2 weeks.  Medication changes made and sent to pharmacy per request.  F/u visit scheduled.

## 2019-10-16 NOTE — Patient Instructions (Signed)
Medication Instructions:  Your physician has recommended you make the following change in your medication:   1,  Increase flecainide 50 mg--Take 1.5 tablets by mouth twice a day   Labwork: None ordered.  Testing/Procedures: None ordered.  Follow-Up: You will come for a nurse visit to [address] office on October 31, 2019 at 8:30 am for EKG.  Any Other Special Instructions Will Be Listed Below (If Applicable).  If you need a refill on your cardiac medications before your next appointment, please call your pharmacy.

## 2019-10-31 ENCOUNTER — Telehealth: Payer: Self-pay | Admitting: *Deleted

## 2019-10-31 ENCOUNTER — Ambulatory Visit (INDEPENDENT_AMBULATORY_CARE_PROVIDER_SITE_OTHER): Payer: 59

## 2019-10-31 ENCOUNTER — Encounter (INDEPENDENT_AMBULATORY_CARE_PROVIDER_SITE_OTHER): Payer: Self-pay

## 2019-10-31 ENCOUNTER — Other Ambulatory Visit: Payer: Self-pay

## 2019-10-31 VITALS — BP 98/60 | HR 76 | Ht 67.0 in | Wt 240.4 lb

## 2019-10-31 DIAGNOSIS — I493 Ventricular premature depolarization: Secondary | ICD-10-CM | POA: Diagnosis not present

## 2019-10-31 MED ORDER — FLECAINIDE ACETATE 100 MG PO TABS: 100.0000 mg | ORAL_TABLET | Freq: Two times a day (BID) | ORAL | 3 refills | Status: DC

## 2019-10-31 NOTE — Telephone Encounter (Signed)
3 day ZIO XT long term holter monitor to be mailed to the patients PO BOX.  Instructions reviewed briefly as they are included in the monitor kit.

## 2019-10-31 NOTE — Patient Instructions (Signed)
Medication Instructions:  Your physician has recommended you make the following change in your medication:   Increase your flecainide to 100 mg BID  Labwork: None ordered.  Testing/Procedures: Your physician has recommended that you wear a holter monitor. Holter monitors are medical devices that record the heart's electrical activity. Doctors most often use these monitors to diagnose arrhythmias. Arrhythmias are problems with the speed or rhythm of the heartbeat. The monitor is a small, portable device. You can wear one while you do your normal daily activities. This is usually used to diagnose what is causing palpitations/syncope (passing out).  24 hour monitor   Follow-Up:  Based on results of your monitor.  Any Other Special Instructions Will Be Listed Below (If Applicable).  If you need a refill on your cardiac medications before your next appointment, please call your pharmacy.

## 2019-10-31 NOTE — Progress Notes (Signed)
1.) Reason for visit: Repeat EKG after increasing Flecainide to 75 mg bid  2.) Name of MD requesting visit: Dr. [name]  3.) H&P: Pt started on flecainide for PVC's  4.) ROS related to problem: Pt flecainide increased to 75 mg bid 2 weeks ago.  Pt states she is tolerating the medication  5.) Assessment and plan per MD: Per review of EKG-Pt continues to have PVC's.  Per Dr. [name] increase flecainide to 100 mg BID and order 24 hour monitor to quantify current burden of PVC's on Flecainide 100 mg BID.    Follow up to be determined by results of heart monitor.

## 2019-10-31 NOTE — Telephone Encounter (Signed)
Please call [phone] pertaining to your long term monitor.

## 2019-11-07 ENCOUNTER — Encounter: Payer: Self-pay | Admitting: Family Medicine

## 2019-11-07 ENCOUNTER — Other Ambulatory Visit: Payer: Self-pay

## 2019-11-07 ENCOUNTER — Ambulatory Visit (INDEPENDENT_AMBULATORY_CARE_PROVIDER_SITE_OTHER): Payer: 59 | Admitting: Family Medicine

## 2019-11-07 VITALS — BP 104/63 | HR 64 | Temp 97.1°F | Ht 67.0 in | Wt 241.4 lb

## 2019-11-07 DIAGNOSIS — Z9989 Dependence on other enabling machines and devices: Secondary | ICD-10-CM

## 2019-11-07 DIAGNOSIS — G4733 Obstructive sleep apnea (adult) (pediatric): Secondary | ICD-10-CM

## 2019-11-07 DIAGNOSIS — G4700 Insomnia, unspecified: Secondary | ICD-10-CM | POA: Diagnosis not present

## 2019-11-07 MED ORDER — TRAZODONE HCL 50 MG PO TABS: 50.0000 mg | ORAL_TABLET | Freq: Every day | ORAL | 11 refills | Status: DC

## 2019-11-07 NOTE — Progress Notes (Addendum)
PATIENT: Christy Holland DOB: 1970/12/27  REASON FOR VISIT: follow up HISTORY FROM: patient  Chief Complaint  Patient presents with   Follow-up    3 mon f/u. Alone. Rm 6, No new concerns at this time.      HISTORY OF PRESENT ILLNESS: Today 11/10/19 Christy Holland is a 49 y.o. female here today for follow up for OSA on CPAP.  She reports that she is doing fairly well on CPAP therapy.  She is working towards nightly use and meeting the 4-hour recommendation.  She denies difficulty with CPAP machine.  She does continue to note insomnia.  She has tried over-the-counter options including melatonin and Benadryl with no success.  Compliance report dated 10/08/2019 through 11/06/2019 reveals that she used CPAP 28 of the last 30 days for compliance of 93.3%.  She used CPAP greater than 4 hours 83% of time.  Average usage was 5 hours and 27 minutes.  Residual AHI was 3.8 on 5 to 11 cm of water and an EPR of 3.  There was no significant leak noted.  HISTORY: (copied from Dr Guadelupe Sabin note on 08/07/2019)  Ms. Christy Holland is a 49 year old right-handed woman with an underlying medical history of rhinitis, recurrent sinusitis, PVCs, and obesity, who presents for follow-up consultation of her obstructive sleep apnea after recent home sleep testing.  The patient is unaccompanied today.  I first met her on 04/10/2019 at the request of her primary care physician, at which time she reported snoring and excessive daytime somnolence with an Epworth sleepiness score of 13 at the time.  She was advised to proceed with a sleep study.  She had a home sleep test on 05/08/2019 which indicated overall mild sleep apnea with a total AHI of 6.6/hour and O2 nadir of 85%.  She was advised to start AutoPap therapy to see if her symptoms improve particularly her daytime somnolence and her snoring.  She did not get set up with AutoPap therapy due to change in insurance. Today, 08/07/2019: She reports that she does not sleep well.   She has had intermittent headaches which are throbbing.  She has a remote history of migraines.  She has some tension type headaches that are more global and tightness-like.  She has tried Tylenol and Motrin.  Her blood pressure is quite high today.  She has not had an eye examination in 2 or 3 years.  She has an appointment with the primary care physician.  She has had some intermittent chest tightness, no shortness of breath.  She has not seen a cardiologist.  She had a cardiac monitor and has been diagnosed with PVCs.  She is on metoprolol for this.  She does not check her blood pressure at home.  She would be willing to get started on AutoPap therapy.  She denies any sudden onset of one-sided weakness, numbness, tingling, slurring of speech or droopy face.  The patient's allergies, current medications, family history, past medical history, past social history, past surgical history and problem list were reviewed and updated as appropriate.   Previously:   04/10/2019: (She) reports snoring and excessive daytime somnolence. I reviewed your office note from 03/04/2019, which you kindly included. Her Epworth sleepiness score is 13 out of 24, fatigue severity score is 39 out of 63. She has had some difficulty initiating and maintaining sleep. She often wakes up in the middle of the night and has difficulty falling back asleep, she also wakes up in the early morning hours sometimes and  has difficulty sleeping through the night for different reasons. She does have witnessed apneas per husband's report and loud snoring. She has woken herself up with a sense of gasping for air and does have morning headaches which are described as dull and achy. Her sister has sleep apnea and her mother has sleep apnea, both have CPAP machines. Her weight has fluctuated. 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 She lives at home with her husband and 3 children, oldest is 54, youngest is almost 31.  They have no pets. She is a non-smoker and does not drink alcohol, does not drink caffeine daily. She has had PVCs and bradycardia. She is scheduled to have an exercise tolerance test next week. She follows with cardiology. She does not wake up rested. She has nocturia on average twice per night.  REVIEW OF SYSTEMS: Out of a complete 14 system review of symptoms, the patient complains only of the following symptoms, insomnia and all other reviewed systems are negative.   Epworth Sleepiness Scale: 12 Fatigue severity scale: 42  ALLERGIES: Allergies  Allergen Reactions   Sulfa Antibiotics     HOME MEDICATIONS: Outpatient Medications Prior to Visit  Medication Sig Dispense Refill   flecainide (TAMBOCOR) 100 MG tablet Take 1 tablet (100 mg total) by mouth 2 (two) times daily. 180 tablet 3   metoprolol tartrate (LOPRESSOR) 25 MG tablet Take 25 mg by mouth 2 (two) times daily.     Multiple Vitamin (MULTIVITAMIN) capsule Take 1 capsule by mouth daily.     Vitamin D, Ergocalciferol, (DRISDOL) 1.25 MG (50000 UT) CAPS capsule Take 50,000 Units by mouth every 7 (seven) days.     metoprolol tartrate (LOPRESSOR) 25 MG tablet Take 1 tablet (25 mg total) by mouth daily. 180 tablet 1   No facility-administered medications prior to visit.    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 No pertinent surgical history.  FAMILY HISTORY: Family History  Problem Relation Age of Onset   Hypertension Mother    Hyperlipidemia Mother    Diabetes Mother    Hypertension Father    Hyperlipidemia Father    Diabetes Father    Heart attack Father 80   Stroke Father     SOCIAL HISTORY: Social  History   Socioeconomic History   Marital status: Married    Spouse name: Not on file   Number of children: 3   Years of education: Not on file   Highest education level: Not on file  Occupational History   Not on file  Tobacco Use   Smoking status: Never Smoker   Smokeless tobacco: Never Used  Substance and Sexual Activity   Alcohol use: No    Alcohol/week: 0.0 standard drinks   Drug use: No   Sexual activity: Yes    Birth control/protection: Condom  Other Topics Concern   Not on file  Social History Narrative   Not on file   Social Determinants of Health   Financial Resource Strain:    Difficulty of Paying Living Expenses: Not on file  Food Insecurity:    Worried About [place] in the Last Year: Not on file   [employer] of Food in the  Last Year: Not on file  Transportation Needs:    Lack of Transportation (Medical): Not on file   Lack of Transportation (Non-Medical): Not on file  Physical Activity:    Days of Exercise per Week: Not on file   Minutes of Exercise per Session: Not on file  Stress:    Feeling of Stress : Not on file  Social Connections:    Frequency of Communication with Friends and Family: Not on file   Frequency of Social Gatherings with Friends and Family: Not on file   Attends Religious Services: Not on file   Active Member of Clubs or Organizations: Not on file   Attends Archivist Meetings: Not on file   Marital Status: Not on file  Intimate Partner Violence:    Fear of Current or Ex-Partner: Not on file   Emotionally Abused: Not on file   Physically Abused: Not on file   Sexually Abused: Not on file      PHYSICAL EXAM  Vitals:   11/07/19 1059  BP: 104/63  Pulse: 64  Temp: (!) 97.1 F (36.2 C)  TempSrc: Oral  Weight: 241 lb 6.4 oz (109.5 kg)  Height: '5\' 7"'  (1.702 m)   Body mass index is 37.81 kg/m.  Generalized: Well developed, in no acute distress  Cardiology: normal rate and  rhythm, no murmur noted  Neurological examination  Mentation: Alert oriented to time, place, history taking. Follows all commands speech and language fluent Cranial nerve II-XII: Pupils were equal round reactive to light. Extraocular movements were full, visual field were full on confrontational test. Facial sensation and strength were normal. Uvula tongue midline. Head turning and shoulder shrug  were normal and symmetric. Motor: The motor testing reveals 5 over 5 strength of all 4 extremities. Good symmetric motor tone is noted throughout.  Sensory: Sensory testing is intact to soft touch on all 4 extremities. No evidence of extinction is noted.  Coordination: Cerebellar testing reveals good finger-nose-finger and heel-to-shin bilaterally.  Gait and station: Gait is normal.   DIAGNOSTIC DATA (LABS, IMAGING, TESTING) - I reviewed patient records, labs, notes, testing and imaging myself where available.  No flowsheet data found.   Lab Results  Component Value Date   WBC 5.8 02/13/2018   HGB 12.6 02/13/2018   HCT 38.6 02/13/2018   MCV 91.1 02/13/2018   PLT 204 07/31/2017      Component Value Date/Time   NA 137 07/31/2017 1712   K 4.5 07/31/2017 1712   CL 98 07/31/2017 1712   CO2 26 07/31/2017 1712   GLUCOSE 88 07/31/2017 1712   BUN 14 07/31/2017 1712   CREATININE 0.79 07/31/2017 1712   CALCIUM 9.3 07/31/2017 1712   PROT 6.9 07/31/2017 1712   ALBUMIN 4.2 07/31/2017 1712   AST 26 07/31/2017 1712   ALT 11 07/31/2017 1712   ALKPHOS 63 07/31/2017 1712   BILITOT <0.2 07/31/2017 1712   GFRNONAA 90 07/31/2017 1712   GFRAA 104 07/31/2017 1712   No results found for: CHOL, HDL, LDLCALC, LDLDIRECT, TRIG, CHOLHDL No results found for: HGBA1C No results found for: VITAMINB12 Lab Results  Component Value Date   TSH 1.450 07/31/2017       ASSESSMENT AND PLAN 49 y.o. year old female  has a past medical history of Chronic maxillary sinusitis (08/21/2017), Frequent PVCs, Holter  monitor, abnormal (08/22/2019), Hypertrophy of tonsil and adenoid (08/21/2017), Rhinitis, chronic (08/21/2017), Sinusitis (02/13/2018), and Snoring (08/21/2017). here with     ICD-10-CM   1. OSA  on CPAP  G47.33    Z99.89   2. Insomnia, unspecified type  G47.00     Anaiz is doing well on CPAP therapy.  Compliance report indicates excellent daily compliance, however, suboptimal compliance with 4-hour usage.  We have discussed the need for nightly CPAP therapy with a minimum of 4-hour usage each night.  Risk of untreated sleep apnea discussed.  She does continue to note concerns of insomnia.  We have discussed over-the-counter options.  Unfortunately, these have not worked well for her.  We will try trazodone 50 mg nightly at bedtime.  Potential side effects discussed.  Additional information provided in AVS.  She will follow-up with me in 3 months, sooner if needed.  She verbalizes understanding and agreement with this plan.    No orders of the defined types were placed in this encounter.    Meds ordered this encounter  Medications   traZODone (DESYREL) 50 MG tablet    Sig: Take 1 tablet (50 mg total) by mouth at bedtime.    Dispense:  30 tablet    Refill:  11    Order Specific Question:   Supervising Provider    Answer:   Melvenia Beam [[id number]]       LTE IHDTP, FNP-C 11/10/2019, 6:37 PM [facility] [address] [place], [place] [address] [phone]  I reviewed the above note and documentation by the Nurse Practitioner and agree with the history, exam, assessment and plan as outlined above. I was available for consultation. Star Age, MD, PhD [facility])

## 2019-11-07 NOTE — Patient Instructions (Addendum)
We will continue CPAP nightly. Work on at least 4 hours each night   We will try trazodone 61m at bedtime   Sleep hygiene  Follow up in 3 months    Trazodone extended release oral tablets What is this medicine? TRAZODONE (TRAZ oh done) is used to treat depression. This medicine may be used for other purposes; ask your health care provider or pharmacist if you have questions. COMMON BRAND NAME(S): Oleptro What should I tell my health care provider before I take this medicine? They need to know if you have any of these conditions:  attempted suicide or thinking about it  bipolar disorder  bleeding problems  glaucoma  heart disease, or previous heart attack  irregular heart beat  kidney disease  liver disease  low levels of sodium in the blood  an unusual or allergic reaction to trazodone, other medicines, foods, dyes or preservatives  pregnant or trying to get pregnant  breast-feeding How should I use this medicine? Take this medicine by mouth with a glass of water. Follow the directions on the prescription label. Take this medicine on an empty stomach, at least 30 minutes before or 2 hours after food. Do not take with food. Do not crush or chew this medicine. You may break in half along the score line. Take your medicine at bedtime everyday. Do not take your medicine more often than directed. Do not stop taking this medicine suddenly except upon the advice of your doctor. Stopping this medicine too quickly may cause serious side effects or your condition may worsen. A special MedGuide will be given to you by the pharmacist with each prescription and refill. Be sure to read this information carefully each time. Talk to your pediatrician regarding the use of this medicine in children. Special care may be needed. Overdosage: If you think you have taken too much of this medicine contact a poison control center or emergency room at once. NOTE: This medicine is only for  you. Do not share this medicine with others. What if I miss a dose? If you miss a dose, take it as soon as you can. If it is almost time for your next dose, take only that dose. Do not take double or extra doses. What may interact with this medicine? Do not take this medicine with any of the following medications:  certain medicines for fungal infections like fluconazole, itraconazole, ketoconazole, posaconazole, voriconazole  cisapride  dronedarone  linezolid  MAOIs like Carbex, Eldepryl, Marplan, Nardil, and Parnate  mesoridazine  methylene blue (injected into a vein)  pimozide  saquinavir  thioridazine This medicine may also interact with the following medications:  alcohol  antiviral medicines for HIV or AIDS  aspirin and aspirin-like medicines  barbiturates like phenobarbital  certain medicines for blood pressure, heart disease, irregular heart beat  certain medicines for depression, anxiety, or psychotic disturbances  certain medicines for migraine headache like almotriptan, eletriptan, frovatriptan, naratriptan, rizatriptan, sumatriptan, zolmitriptan  certain medicines for seizures like carbamazepine, phenytoin  certain medicines for sleep  certain medicines that treat or prevent blood clots like dalteparin, enoxaparin, warfarin  digoxin  fentanyl  lithium  NSAIDS, medicines for pain and inflammation, like ibuprofen or naproxen  other medicines that prolong the QT interval (cause an abnormal heart rhythm) like dofetilide  rasagiline  supplements like St. John's wort, kava kava, valerian  tramadol  tryptophan This list may not describe all possible interactions. Give your health care provider a list of all the medicines, herbs,  non-prescription drugs, or dietary supplements you use. Also tell them if you smoke, drink alcohol, or use illegal drugs. Some items may interact with your medicine. What should I watch for while using this medicine? Tell  your doctor if your symptoms do not get better or if they get worse. Visit your doctor or health care professional for regular checks on your progress. Because it may take several weeks to see the full effects of this medicine, it is important to continue your treatment as prescribed by your doctor. Patients and their families should watch out for new or worsening thoughts of suicide or depression. Also watch out for sudden changes in feelings such as feeling anxious, agitated, panicky, irritable, hostile, aggressive, impulsive, severely restless, overly excited and hyperactive, or not being able to sleep. If this happens, especially at the beginning of treatment or after a change in dose, call your health care professional. Dennis Bast may get drowsy or dizzy. Do not drive, use machinery, or do anything that needs mental alertness until you know how this medicine affects you. Do not stand or sit up quickly, especially if you are an older patient. This reduces the risk of dizzy or fainting spells. Alcohol may interfere with the effect of this medicine. Avoid alcoholic drinks. This medicine may cause dry eyes and blurred vision. If you wear contact lenses you may feel some discomfort. Lubricating drops may help. See your eye doctor if the problem does not go away or is severe. Your mouth may get dry. Chewing sugarless gum, sucking hard candy and drinking plenty of water may help. Contact your doctor if the problem does not go away or is severe. What side effects may I notice from receiving this medicine? Side effects that you should report to your doctor or health care professional as soon as possible:  allergic reactions like skin rash, itching or hives, swelling of the face, lips, or tongue  elevated mood, decreased need for sleep, racing thoughts, impulsive behavior  confusion  fast, irregular heartbeat  feeling faint or lightheaded, falls  feeling agitated, angry, or irritable  loss of balance or  coordination  painful or prolonged erections  restlessness, pacing, inability to keep still  suicidal thoughts or other mood changes  tremors  trouble sleeping  seizures  unusual bleeding or bruising Side effects that usually do not require medical attention (report to your doctor or health care professional if they continue or are bothersome):  change in sex drive or performance  change in appetite or weight  constipation  headache  muscle aches or pains  nausea This list may not describe all possible side effects. Call your doctor for medical advice about side effects. You may report side effects to FDA at 1-800-FDA-1088. Where should I keep my medicine? Keep out of the reach of children. Store at room temperature between 15 and 30 degrees C (59 to 86 degrees F). Protect from light. Keep container tightly closed. Throw away any unused medicine after the expiration date. NOTE: This sheet is a summary. It may not cover all possible information. If you have questions about this medicine, talk to your doctor, pharmacist, or health care provider.  2020 Elsevier/Gold Standard (2018-10-02 11:44:32)    Insomnia Insomnia is a sleep disorder that makes it difficult to fall asleep or stay asleep. Insomnia can cause fatigue, low energy, difficulty concentrating, mood swings, and poor performance at work or school. There are three different ways to classify insomnia:  Difficulty falling asleep.  Difficulty staying asleep.  Waking up too early in the morning. Any type of insomnia can be long-term (chronic) or short-term (acute). Both are common. Short-term insomnia usually lasts for three months or less. Chronic insomnia occurs at least three times a week for longer than three months. What are the causes? Insomnia may be caused by another condition, situation, or substance, such as:  Anxiety.  Certain medicines.  Gastroesophageal reflux disease (GERD) or other  gastrointestinal conditions.  Asthma or other breathing conditions.  Restless legs syndrome, sleep apnea, or other sleep disorders.  Chronic pain.  Menopause.  Stroke.  Abuse of alcohol, tobacco, or illegal drugs.  Mental health conditions, such as depression.  Caffeine.  Neurological disorders, such as Alzheimer's disease.  An overactive thyroid (hyperthyroidism). Sometimes, the cause of insomnia may not be known. What increases the risk? Risk factors for insomnia include:  Gender. Women are affected more often than men.  Age. Insomnia is more common as you get older.  Stress.  Lack of exercise.  Irregular work schedule or working night shifts.  Traveling between different time zones.  Certain medical and mental health conditions. What are the signs or symptoms? If you have insomnia, the main symptom is having trouble falling asleep or having trouble staying asleep. This may lead to other symptoms, such as:  Feeling fatigued or having low energy.  Feeling nervous about going to sleep.  Not feeling rested in the morning.  Having trouble concentrating.  Feeling irritable, anxious, or depressed. How is this diagnosed? This condition may be diagnosed based on:  Your symptoms and medical history. Your health care provider may ask about: ? Your sleep habits. ? Any medical conditions you have. ? Your mental health.  A physical exam. How is this treated? Treatment for insomnia depends on the cause. Treatment may focus on treating an underlying condition that is causing insomnia. Treatment may also include:  Medicines to help you sleep.  Counseling or therapy.  Lifestyle adjustments to help you sleep better. Follow these instructions at home: Eating and drinking   Limit or avoid alcohol, caffeinated beverages, and cigarettes, especially close to bedtime. These can disrupt your sleep.  Do not eat a large meal or eat spicy foods right before bedtime.  This can lead to digestive discomfort that can make it hard for you to sleep. Sleep habits   Keep a sleep diary to help you and your health care provider figure out what could be causing your insomnia. Write down: ? When you sleep. ? When you wake up during the night. ? How well you sleep. ? How rested you feel the next day. ? Any side effects of medicines you are taking. ? What you eat and drink.  Make your bedroom a dark, comfortable place where it is easy to fall asleep. ? Put up shades or blackout curtains to block light from outside. ? Use a white noise machine to block noise. ? Keep the temperature cool.  Limit screen use before bedtime. This includes: ? Watching TV. ? Using your smartphone, tablet, or computer.  Stick to a routine that includes going to bed and waking up at the same times every day and night. This can help you fall asleep faster. Consider making a quiet activity, such as reading, part of your nighttime routine.  Try to avoid taking naps during the day so that you sleep better at night.  Get out of bed if you are still awake after 15 minutes of trying to sleep. Keep the lights down,  but try reading or doing a quiet activity. When you feel sleepy, go back to bed. General instructions  Take over-the-counter and prescription medicines only as told by your health care provider.  Exercise regularly, as told by your health care provider. Avoid exercise starting several hours before bedtime.  Use relaxation techniques to manage stress. Ask your health care provider to suggest some techniques that may work well for you. These may include: ? Breathing exercises. ? Routines to release muscle tension. ? Visualizing peaceful scenes.  Make sure that you drive carefully. Avoid driving if you feel very sleepy.  Keep all follow-up visits as told by your health care provider. This is important. Contact a health care provider if:  You are tired throughout the  day.  You have trouble in your daily routine due to sleepiness.  You continue to have sleep problems, or your sleep problems get worse. Get help right away if:  You have serious thoughts about hurting yourself or someone else. If you ever feel like you may hurt yourself or others, or have thoughts about taking your own life, get help right away. You can go to your nearest emergency department or call:  Your local emergency services (911 in the [place]  A suicide crisis helpline, such as the [place] at (740)304-2897. This is open 24 hours a day. Summary  Insomnia is a sleep disorder that makes it difficult to fall asleep or stay asleep.  Insomnia can be long-term (chronic) or short-term (acute).  Treatment for insomnia depends on the cause. Treatment may focus on treating an underlying condition that is causing insomnia.  Keep a sleep diary to help you and your health care provider figure out what could be causing your insomnia. This information is not intended to replace advice given to you by your health care provider. Make sure you discuss any questions you have with your health care provider. Document Revised: 09/22/2017 Document Reviewed: 07/20/2017 Elsevier Patient Education  [place].    Fatigue If you have fatigue, you feel tired all the time and have a lack of energy or a lack of motivation. Fatigue may make it difficult to start or complete tasks because of exhaustion. In general, occasional or mild fatigue is often a normal response to activity or life. However, long-lasting (chronic) or extreme fatigue may be a symptom of a medical condition. Follow these instructions at home: General instructions  Watch your fatigue for any changes.  Go to bed and get up at the same time every day.  Avoid fatigue by pacing yourself during the day and getting enough sleep at night.  Maintain a healthy weight. Medicines  Take over-the-counter  and prescription medicines only as told by your health care provider.  Take a multivitamin, if told by your health care provider.  Do not use herbal or dietary supplements unless they are approved by your health care provider. Activity   Exercise regularly, as told by your health care provider.  Use or practice techniques to help you relax, such as yoga, tai chi, meditation, or massage therapy. Eating and drinking   Avoid heavy meals in the evening.  Eat a well-balanced diet, which includes lean proteins, whole grains, plenty of fruits and vegetables, and low-fat dairy products.  Avoid consuming too much caffeine.  Avoid the use of alcohol.  Drink enough fluid to keep your urine pale yellow. Lifestyle  Change situations that cause you stress. Try to keep your work and personal schedule in  balance.  Do not use any products that contain nicotine or tobacco, such as cigarettes and e-cigarettes. If you need help quitting, ask your health care provider.  Do not use drugs. Contact a health care provider if:  Your fatigue does not get better.  You have a fever.  You suddenly lose or gain weight.  You have headaches.  You have trouble falling asleep or sleeping through the night.  You feel angry, guilty, anxious, or sad.  You are unable to have a bowel movement (constipation).  Your skin is dry.  You have swelling in your legs or another part of your body. Get help right away if:  You feel confused.  Your vision is blurry.  You feel faint or you pass out.  You have a severe headache.  You have severe pain in your abdomen, your back, or the area between your waist and hips (pelvis).  You have chest pain, shortness of breath, or an irregular or fast heartbeat.  You are unable to urinate, or you urinate less than normal.  You have abnormal bleeding, such as bleeding from the rectum, vagina, nose, lungs, or nipples.  You vomit blood.  You have thoughts about  hurting yourself or others. If you ever feel like you may hurt yourself or others, or have thoughts about taking your own life, get help right away. You can go to your nearest emergency department or call:  Your local emergency services (911 in the [place]  A suicide crisis helpline, such as the [place] at 952-273-2339. This is open 24 hours a day. Summary  If you have fatigue, you feel tired all the time and have a lack of energy or a lack of motivation.  Fatigue may make it difficult to start or complete tasks because of exhaustion.  Long-lasting (chronic) or extreme fatigue may be a symptom of a medical condition.  Exercise regularly, as told by your health care provider.  Change situations that cause you stress. Try to keep your work and personal schedule in balance. This information is not intended to replace advice given to you by your health care provider. Make sure you discuss any questions you have with your health care provider. Document Revised: 05/01/2019 Document Reviewed: 07/05/2017 Elsevier Patient Education  [place].   Sleep Apnea Sleep apnea affects breathing during sleep. It causes breathing to stop for a short time or to become shallow. It can also increase the risk of:  Heart attack.  Stroke.  Being very overweight (obese).  Diabetes.  Heart failure.  Irregular heartbeat. The goal of treatment is to help you breathe normally again. What are the causes? There are three kinds of sleep apnea:  Obstructive sleep apnea. This is caused by a blocked or collapsed airway.  Central sleep apnea. This happens when the brain does not send the right signals to the muscles that control breathing.  Mixed sleep apnea. This is a combination of obstructive and central sleep apnea. The most common cause of this condition is a collapsed or blocked airway. This can happen if:  Your throat muscles are too relaxed.  Your tongue and  tonsils are too large.  You are overweight.  Your airway is too small. What increases the risk?  Being overweight.  Smoking.  Having a small airway.  Being older.  Being female.  Drinking alcohol.  Taking medicines to calm yourself (sedatives or tranquilizers).  Having family members with the condition. What are the signs or symptoms?  Trouble staying asleep.  Being sleepy or tired during the day.  Getting angry a lot.  Loud snoring.  Headaches in the morning.  Not being able to focus your mind (concentrate).  Forgetting things.  Less interest in sex.  Mood swings.  Personality changes.  Feelings of sadness (depression).  Waking up a lot during the night to pee (urinate).  Dry mouth.  Sore throat. How is this diagnosed?  Your medical history.  A physical exam.  A test that is done when you are sleeping (sleep study). The test is most often done in a sleep lab but may also be done at home. How is this treated?   Sleeping on your side.  Using a medicine to get rid of mucus in your nose (decongestant).  Avoiding the use of alcohol, medicines to help you relax, or certain pain medicines (narcotics).  Losing weight, if needed.  Changing your diet.  Not smoking.  Using a machine to open your airway while you sleep, such as: ? An oral appliance. This is a mouthpiece that shifts your lower jaw forward. ? A CPAP device. This device blows air through a mask when you breathe out (exhale). ? An EPAP device. This has valves that you put in each nostril. ? A BPAP device. This device blows air through a mask when you breathe in (inhale) and breathe out.  Having surgery if other treatments do not work. It is important to get treatment for sleep apnea. Without treatment, it can lead to:  High blood pressure.  Coronary artery disease.  In men, not being able to have an erection (impotence).  Reduced thinking ability. Follow these instructions at  home: Lifestyle  Make changes that your doctor recommends.  Eat a healthy diet.  Lose weight if needed.  Avoid alcohol, medicines to help you relax, and some pain medicines.  Do not use any products that contain nicotine or tobacco, such as cigarettes, e-cigarettes, and chewing tobacco. If you need help quitting, ask your doctor. General instructions  Take over-the-counter and prescription medicines only as told by your doctor.  If you were given a machine to use while you sleep, use it only as told by your doctor.  If you are having surgery, make sure to tell your doctor you have sleep apnea. You may need to bring your device with you.  Keep all follow-up visits as told by your doctor. This is important. Contact a doctor if:  The machine that you were given to use during sleep bothers you or does not seem to be working.  You do not get better.  You get worse. Get help right away if:  Your chest hurts.  You have trouble breathing in enough air.  You have an uncomfortable feeling in your back, arms, or stomach.  You have trouble talking.  One side of your body feels weak.  A part of your face is hanging down. These symptoms may be an emergency. Do not wait to see if the symptoms will go away. Get medical help right away. Call your local emergency services (911 in the [place] Do not drive yourself to the hospital. Summary we have discussed appropriate sleep hygiene.  This condition affects breathing during sleep.  The most common cause is a collapsed or blocked airway.  The goal of treatment is to help you breathe normally while you sleep. This information is not intended to replace advice given to you by your health care provider. Make sure you discuss any  questions you have with your health care provider. Document Revised: 07/27/2018 Document Reviewed: 06/05/2018 Elsevier Patient Education  [place].

## 2019-11-08 ENCOUNTER — Other Ambulatory Visit (INDEPENDENT_AMBULATORY_CARE_PROVIDER_SITE_OTHER): Payer: 59

## 2019-11-08 DIAGNOSIS — I493 Ventricular premature depolarization: Secondary | ICD-10-CM

## 2019-11-10 ENCOUNTER — Encounter: Payer: Self-pay | Admitting: Family Medicine

## 2019-11-14 ENCOUNTER — Ambulatory Visit: Payer: 59 | Admitting: Internal Medicine

## 2019-11-18 ENCOUNTER — Ambulatory Visit (INDEPENDENT_AMBULATORY_CARE_PROVIDER_SITE_OTHER): Payer: 59 | Admitting: Family Medicine

## 2019-11-20 ENCOUNTER — Telehealth: Payer: Self-pay | Admitting: Family Medicine

## 2019-11-20 NOTE — Telephone Encounter (Signed)
Please let her know that we may not have given it enough time yet. This medication can take time to work. Have her give it 4 more week (total of six weeks) and we will follow up if needed to discuss new dose. TY!

## 2019-11-20 NOTE — Telephone Encounter (Signed)
Pt called stating that her traZODone (DESYREL) 50 MG tablet is not working for her and the pharmacist advised her to call the provider to see if they will up the dose or change the medication for her. Please advise.

## 2019-11-21 NOTE — Telephone Encounter (Signed)
I spoke with the pt and I discussed the message from Amy NP. Pt verbalized understanding and agrees with the plan. Next f/u appt is 02/05/20. Pt wanted it to be noted that the daytime sleepiness is still bothering her. She stated this has been discussed in the past and the provider did not feel she was the level for [place]. I encouraged patient to give the Trazodone some time and if she improves at night she may see improvement in her daytime sleepiness. Pt verbalized appreciation for the call and will stay in touch as needed.

## 2019-12-02 ENCOUNTER — Ambulatory Visit (INDEPENDENT_AMBULATORY_CARE_PROVIDER_SITE_OTHER): Payer: 59 | Admitting: Family Medicine

## 2019-12-06 ENCOUNTER — Ambulatory Visit (INDEPENDENT_AMBULATORY_CARE_PROVIDER_SITE_OTHER): Payer: 59 | Admitting: Internal Medicine

## 2019-12-06 ENCOUNTER — Encounter: Payer: Self-pay | Admitting: Internal Medicine

## 2019-12-06 ENCOUNTER — Other Ambulatory Visit: Payer: Self-pay

## 2019-12-06 VITALS — BP 112/64 | HR 72 | Ht 67.0 in | Wt 241.8 lb

## 2019-12-06 DIAGNOSIS — I493 Ventricular premature depolarization: Secondary | ICD-10-CM

## 2019-12-06 MED ORDER — METOPROLOL TARTRATE 50 MG PO TABS: 50.0000 mg | ORAL_TABLET | Freq: Two times a day (BID) | ORAL | 3 refills | Status: DC

## 2019-12-06 MED ORDER — FLECAINIDE ACETATE 100 MG PO TABS: 100.0000 mg | ORAL_TABLET | Freq: Two times a day (BID) | ORAL | 3 refills | Status: DC

## 2019-12-06 NOTE — Patient Instructions (Addendum)
Medication Instructions:  Your physician has recommended you make the following change in your medication:  1.  Increase your metoprolol tartrate 25 mg--increase to 50 mg by mouth twice a day.  OTHER MEDICATION OPTIONS:  Sotalol-would require a 2 day hospital stay to initiate this medication  Amiodarone-would try this medication for a couple of years.  Would not want you to be on this medication long term at your age.  Labwork: None ordered.  Testing/Procedures: Your physician has recommended that you have an ablation. Catheter ablation is a medical procedure used to treat some cardiac arrhythmias (irregular heartbeats). During catheter ablation, a long, thin, flexible tube is put into a blood vessel in your groin (upper thigh), or neck. This tube is called an ablation catheter. It is then guided to your heart through the blood vessel. Radio frequency waves destroy small areas of heart tissue where abnormal heartbeats may cause an arrhythmia to start. Please see the instruction sheet given to you today.  Follow-Up: Your physician wants you to follow-up in: 3 months with Dr. [name].     Mar 04, 2020 at 9:45 am    Any Other Special Instructions Will Be Listed Below (If Applicable).  If you need a refill on your cardiac medications before your next appointment, please call your pharmacy.    Cardiac Ablation Cardiac ablation is a procedure to disable (ablate) a small amount of heart tissue in very specific places. The heart has many electrical connections. Sometimes these connections are abnormal and can cause the heart to beat very fast or irregularly. Ablating some of the problem areas can improve the heart rhythm or return it to normal. Ablation may be done for people who:  Have Wolff-Parkinson-White syndrome.  Have fast heart rhythms (tachycardia).  Have taken medicines for an abnormal heart rhythm (arrhythmia) that were not effective or caused side effects.  Have a high-risk  heartbeat that may be life-threatening. During the procedure, a small incision is made in the neck or the groin, and a long, thin, flexible tube (catheter) is inserted into the incision and moved to the heart. Small devices (electrodes) on the tip of the catheter will send out electrical currents. A type of X-ray (fluoroscopy) will be used to help guide the catheter and to provide images of the heart. Tell a health care provider about:  Any allergies you have.  All medicines you are taking, including vitamins, herbs, eye drops, creams, and over-the-counter medicines.  Any problems you or family members have had with anesthetic medicines.  Any blood disorders you have.  Any surgeries you have had.  Any medical conditions you have, such as kidney failure.  Whether you are pregnant or may be pregnant. What are the risks? Generally, this is a safe procedure. However, problems may occur, including:  Infection.  Bruising and bleeding at the catheter insertion site.  Bleeding into the chest, especially into the sac that surrounds the heart. This is a serious complication.  Stroke or blood clots.  Damage to other structures or organs.  Allergic reaction to medicines or dyes.  Need for a permanent pacemaker if the normal electrical system is damaged. A pacemaker is a small computer that sends electrical signals to the heart and helps your heart beat normally.  The procedure not being fully effective. This may not be recognized until months later. Repeat ablation procedures are sometimes required. What happens before the procedure?  Follow instructions from your health care provider about eating or drinking restrictions.  Ask your  health care provider about: ? Changing or stopping your regular medicines. This is especially important if you are taking diabetes medicines or blood thinners. ? Taking medicines such as aspirin and ibuprofen. These medicines can thin your blood. Do not take  these medicines before your procedure if your health care provider instructs you not to.  Plan to have someone take you home from the hospital or clinic.  If you will be going home right after the procedure, plan to have someone with you for 24 hours. What happens during the procedure?  To lower your risk of infection: ? Your health care team will wash or sanitize their hands. ? Your skin will be washed with soap. ? Hair may be removed from the incision area.  An IV tube will be inserted into one of your veins.  You will be given a medicine to help you relax (sedative).  The skin on your neck or groin will be numbed.  An incision will be made in your neck or your groin.  A needle will be inserted through the incision and into a large vein in your neck or groin.  A catheter will be inserted into the needle and moved to your heart.  Dye may be injected through the catheter to help your surgeon see the area of the heart that needs treatment.  Electrical currents will be sent from the catheter to ablate heart tissue in desired areas. There are three types of energy that may be used to ablate heart tissue: ? Heat (radiofrequency energy). ? Laser energy. ? Extreme cold (cryoablation).  When the necessary tissue has been ablated, the catheter will be removed.  Pressure will be held on the catheter insertion area to prevent excessive bleeding.  A bandage (dressing) will be placed over the catheter insertion area. The procedure may vary among health care providers and hospitals. What happens after the procedure?  Your blood pressure, heart rate, breathing rate, and blood oxygen level will be monitored until the medicines you were given have worn off.  Your catheter insertion area will be monitored for bleeding. You will need to lie still for a few hours to ensure that you do not bleed from the catheter insertion area.  Do not drive for 24 hours or as long as directed by your health  care provider. Summary  Cardiac ablation is a procedure to disable (ablate) a small amount of heart tissue in very specific places. Ablating some of the problem areas can improve the heart rhythm or return it to normal.  During the procedure, electrical currents will be sent from the catheter to ablate heart tissue in desired areas. This information is not intended to replace advice given to you by your health care provider. Make sure you discuss any questions you have with your health care provider. Document Revised: 04/02/2018 Document Reviewed: 08/29/2016 Elsevier Patient Education  2020 Elsevier Inc.  Sotalol tablets (Betapace) What is this medicine? SOTALOL (SOE ta lole) is a beta-blocker. Beta-blockers reduce the workload on the heart and help it to beat more regularly. This medicine is used to treat heart rhythm problems and to slow rapid heartbeats. This medicine can help your heart to return to and maintain a normal rhythm. This medicine may be used for other purposes; ask your health care provider or pharmacist if you have questions. COMMON BRAND NAME(S): BETAPACE, BETAPACE AF, Sorine What should I tell my health care provider before I take this medicine? They need to know if you have any  of these conditions:  diabetes  heart or vessel disease like slow heart rate, worsening heart failure, heart block, sick sinus syndrome or Raynaud's disease  kidney disease  liver disease  history of low levels of potassium or magnesium  lung or breathing disease, like asthma or emphysema  pheochromocytoma  recent heart attack  thyroid disease  an unusual or allergic reaction to sotalol, other beta-blockers, medicines, foods, dyes, or preservatives  pregnant or trying to get pregnant  breast-feeding How should I use this medicine? Take this medicine by mouth with a glass of water. Follow the directions on the prescription label. Take your doses at regular intervals. Do not take  your medicine more often than directed. Do not stop taking this medicine suddenly. This could lead to serious heart-related effects. Talk to your pediatrician regarding the use of this medicine in children. Special care may be needed. While this medicine may be used in children for selected conditions precautions do apply. Overdosage: If you think you have taken too much of this medicine contact a poison control center or emergency room at once. NOTE: This medicine is only for you. Do not share this medicine with others. What if I miss a dose? If you miss a dose, take it as soon as you can. If it is almost time for your next dose, take only that dose. Do not take double or extra doses. What may interact with this medicine? Do not take this medicine with any of the following medications:  amoxapine  arsenic trioxide  certain antibiotics like gatifloxacin, grepafloxacin, levofloxacin, moxifloxacin, sparfloxacin, telithromycin  cisapride  droperidol  haloperidol  hawthorn  maprotiline  medicines for malaria like chloroquine and halofantrine  medicines to control heart rhythm  methadone  pentamidine  phenothiazines like prochlorperazine, perphenazine, thioridazine, and others  pimozide  ranolazine  tricyclic antidepressants like amitriptyline, imipramine, nortriptyline, and others  vardenafil This medicine may also interact with the following medications:  antacids  certain antibiotics such as clarithromycin and erythromycin  clonidine  digoxin  medicines for angina or high blood pressure  medicines for colds and breathing difficulties  medicines for diabetes  other beta-blockers like atenolol, metoprolol, propranolol and others  ziprasidone This list may not describe all possible interactions. Give your health care provider a list of all the medicines, herbs, non-prescription drugs, or dietary supplements you use. Also tell them if you smoke, drink alcohol,  or use illegal drugs. Some items may interact with your medicine. What should I watch for while using this medicine? Visit your doctor or health care professional for regular checks on your progress. Check your heart rate and blood pressure regularly while you are taking this medicine. Ask your doctor or health care professional what your heart rate and blood pressure should be, and when you should contact him or her. Your doctor or health care professional also may schedule regular blood tests and electrocardiograms to check your progress. Because your condition and the use of this medicine carry some risk, it is a good idea to carry an identification card, necklace or bracelet with details of your condition, medications, and doctor or health care professional. Bonita QuinYou may get drowsy or dizzy. Do not drive, use machinery, or do anything that needs mental alertness until you know how this drug affects you. Do not stand or sit up quickly, especially if you are an older patient. This reduces the risk of dizzy or fainting spells. Alcohol can make you more drowsy and dizzy. Avoid alcoholic drinks. Do not  treat yourself for coughs, colds, or pain while you are taking this medicine without asking your doctor or health care professional for advice. Some ingredients may increase your blood pressure. If you are going to have surgery, tell your doctor or health care professional that you are taking this medicine. What side effects may I notice from receiving this medicine? Side effects that you should report to your doctor or health care professional as soon as possible:  chest pain  cold, tingling, or numb hands or feet  confusion  diarrhea  difficulty breathing, wheezing  irregular heartbeat  muscle aches and pains  skin rash  slow heart rate  sweating  swollen legs or ankles  tremor, shakes  vomiting Side effects that usually do not require medical attention (report to your doctor or health  care professional if they continue or are bothersome):  change in sex drive or performance  mental depression  nausea  weakness or tiredness This list may not describe all possible side effects. Call your doctor for medical advice about side effects. You may report side effects to FDA at 1-800-FDA-1088. Where should I keep my medicine? Keep out of the reach of children. Store at room temperature between 15 and 30 degrees C (59 and 86 degrees F). Throw away any unused medicine after the expiration date. NOTE: This sheet is a summary. It may not cover all possible information. If you have questions about this medicine, talk to your doctor, pharmacist, or health care provider.  2020 Elsevier/Gold Standard (2018-10-02 11:02:43)  Amiodarone tablets What is this medicine? AMIODARONE (a MEE oh da rone) is an antiarrhythmic drug. It helps make your heart beat regularly. Because of the side effects caused by this medicine, it is only used when other medicines have not worked. It is usually used for heartbeat problems that may be life threatening. This medicine may be used for other purposes; ask your health care provider or pharmacist if you have questions. COMMON BRAND NAME(S): Cordarone, Pacerone What should I tell my health care provider before I take this medicine? They need to know if you have any of these conditions:  liver disease  lung disease  other heart problems  thyroid disease  an unusual or allergic reaction to amiodarone, iodine, other medicines, foods, dyes, or preservatives  pregnant or trying to get pregnant  breast-feeding How should I use this medicine? Take this medicine by mouth with a glass of water. Follow the directions on the prescription label. You can take this medicine with or without food. However, you should always take it the same way each time. Take your doses at regular intervals. Do not take your medicine more often than directed. Do not stop taking  except on the advice of your doctor or health care professional. A special MedGuide will be given to you by the pharmacist with each prescription and refill. Be sure to read this information carefully each time. Talk to your pediatrician regarding the use of this medicine in children. Special care may be needed. Overdosage: If you think you have taken too much of this medicine contact a poison control center or emergency room at once. NOTE: This medicine is only for you. Do not share this medicine with others. What if I miss a dose? If you miss a dose, take it as soon as you can. If it is almost time for your next dose, take only that dose. Do not take double or extra doses. What may interact with this medicine? Do not  take this medicine with any of the following medications:  abarelix  apomorphine  arsenic trioxide  certain antibiotics like erythromycin, gemifloxacin, levofloxacin, pentamidine  certain medicines for depression like amoxapine, tricyclic antidepressants  certain medicines for fungal infections like fluconazole, itraconazole, ketoconazole, posaconazole, voriconazole  certain medicines for irregular heart beat like disopyramide, dronedarone, ibutilide, propafenone, sotalol  certain medicines for malaria like chloroquine, halofantrine  cisapride  droperidol  haloperidol  hawthorn  maprotiline  methadone  phenothiazines like chlorpromazine, mesoridazine, thioridazine  pimozide  ranolazine  red yeast rice  vardenafil This medicine may also interact with the following medications:  antiviral medicines for HIV or AIDS  certain medicines for blood pressure, heart disease, irregular heart beat  certain medicines for cholesterol like atorvastatin, cerivastatin, lovastatin, simvastatin  certain medicines for hepatitis C like sofosbuvir and ledipasvir; sofosbuvir  certain medicines for seizures like phenytoin  certain medicines for thyroid  problems  certain medicines that treat or prevent blood clots like warfarin  cholestyramine  cimetidine  clopidogrel  cyclosporine  dextromethorphan  diuretics  dofetilide  fentanyl  general anesthetics  grapefruit juice  lidocaine  loratadine  methotrexate  other medicines that prolong the QT interval (cause an abnormal heart rhythm)  procainamide  quinidine  rifabutin, rifampin, or rifapentine  St. John's Wort  trazodone  ziprasidone This list may not describe all possible interactions. Give your health care provider a list of all the medicines, herbs, non-prescription drugs, or dietary supplements you use. Also tell them if you smoke, drink alcohol, or use illegal drugs. Some items may interact with your medicine. What should I watch for while using this medicine? Your condition will be monitored closely when you first begin therapy. Often, this drug is first started in a hospital or other monitored health care setting. Once you are on maintenance therapy, visit your doctor or health care professional for regular checks on your progress. Because your condition and use of this medicine carry some risk, it is a good idea to carry an identification card, necklace or bracelet with details of your condition, medications, and doctor or health care professional. Dennis Bast may get drowsy or dizzy. Do not drive, use machinery, or do anything that needs mental alertness until you know how this medicine affects you. Do not stand or sit up quickly, especially if you are an older patient. This reduces the risk of dizzy or fainting spells. This medicine can make you more sensitive to the sun. Keep out of the sun. If you cannot avoid being in the sun, wear protective clothing and use sunscreen. Do not use sun lamps or tanning beds/booths. You should have regular eye exams before and during treatment. Call your doctor if you have blurred vision, see halos, or your eyes become sensitive  to light. Your eyes may get dry. It may be helpful to use a lubricating eye solution or artificial tears solution. If you are going to have surgery or a procedure that requires contrast dyes, tell your doctor or health care professional that you are taking this medicine. What side effects may I notice from receiving this medicine? Side effects that you should report to your doctor or health care professional as soon as possible:  allergic reactions like skin rash, itching or hives, swelling of the face, lips, or tongue  blue-gray coloring of the skin  blurred vision, seeing blue green halos, increased sensitivity of the eyes to light  breathing problems  chest pain  dark urine  fast, irregular heartbeat  feeling faint  or light-headed  intolerance to heat or cold  nausea or vomiting  pain and swelling of the scrotum  pain, tingling, numbness in feet, hands  redness, blistering, peeling or loosening of the skin, including inside the mouth  spitting up blood  stomach pain  sweating  unusual or uncontrolled movements of body  unusually weak or tired  weight gain or loss  yellowing of the eyes or skin Side effects that usually do not require medical attention (report to your doctor or health care professional if they continue or are bothersome):  change in sex drive or performance  constipation  dizziness  headache  loss of appetite  trouble sleeping This list may not describe all possible side effects. Call your doctor for medical advice about side effects. You may report side effects to FDA at 1-800-FDA-1088. Where should I keep my medicine? Keep out of the reach of children. Store at room temperature between 20 and 25 degrees C (68 and 77 degrees F). Protect from light. Keep container tightly closed. Throw away any unused medicine after the expiration date. NOTE: This sheet is a summary. It may not cover all possible information. If you have questions about  this medicine, talk to your doctor, pharmacist, or health care provider.  2020 Elsevier/Gold Standard (2018-09-12 13:44:04)

## 2019-12-06 NOTE — Progress Notes (Signed)
HPI Christy Holland returns today for followup of symptomatic PVC's. She has been placed on the combination of metorpolol and flecainide and has tolerated these medications. She wore a repeat cardiac monitor on this medical therapy and still has over 25% PVC burden. No syncope and no chest pain. She has previously preserved LV function. She denies chest pain.  Allergies  Allergen Reactions  . Sulfa Antibiotics      Current Outpatient Medications  Medication Sig Dispense Refill  . flecainide (TAMBOCOR) 100 MG tablet Take 1 tablet (100 mg total) by mouth 2 (two) times daily. 180 tablet 3  . Multiple Vitamin (MULTIVITAMIN) capsule Take 1 capsule by mouth daily.    . traZODone (DESYREL) 50 MG tablet Take 1 tablet (50 mg total) by mouth at bedtime. 30 tablet 11  . Vitamin D, Ergocalciferol, (DRISDOL) 1.25 MG (50000 UT) CAPS capsule Take 50,000 Units by mouth every 7 (seven) days.    . metoprolol tartrate (LOPRESSOR) 50 MG tablet Take 1 tablet (50 mg total) by mouth 2 (two) times daily. 180 tablet 3   No current facility-administered medications for this visit.     Past Medical History:  Diagnosis Date  . Chronic maxillary sinusitis 08/21/2017  . Frequent PVCs   . Holter monitor, abnormal 08/22/2019  . 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 Rhinitis, chronic 08/21/2017  . Sinusitis 02/13/2018  . Snoring 08/21/2017    ROS:   All systems reviewed and negative except as noted in the HPI.   History reviewed. No pertinent surgical history.   Family History  Problem Relation Age of Onset  . Hypertension Mother   . Hyperlipidemia Mother   . Diabetes Mother   . Hypertension Father   . Hyperlipidemia Father   . Diabetes Father   . Heart attack Father 33  . Stroke Father      Social History   Socioeconomic History  . Marital status: Married    Spouse name: Not on file  . Number of children: 3  . Years of education: Not on file  . Highest education level: Not  on file  Occupational History  . Not on file  Tobacco Use  . Smoking status: Never Smoker  . Smokeless tobacco: Never Used  Substance and Sexual Activity  . Alcohol use: No    Alcohol/week: 0.0 standard drinks  . Drug use: No  . Sexual activity: Yes    Birth control/protection: Condom  Other Topics Concern  . Not on file  Social History Narrative  . Not on file   Social Determinants of Health   Financial Resource Strain:   . Difficulty of Paying Living Expenses: Not on file  Food Insecurity:   . Worried About Programme researcher, broadcasting/film/video in the Last Year: Not on file  . Ran Out of Food in the Last Year: Not on file  Transportation Needs:   . Lack of Transportation (Medical): Not on file  . Lack of Transportation (Non-Medical): Not on file  Physical Activity:   . Days of Exercise per Week: Not on file  . Minutes of Exercise per Session: Not on file  Stress:   . Feeling of Stress : Not on file  Social Connections:   . Frequency of Communication with Friends and Family: Not on file  . Frequency of Social Gatherings with Friends and Family: Not on file  . Attends Religious Services: Not on file  . Active Member of Clubs or Organizations: Not on file  .  Attends Archivist Meetings: Not on file  . Marital Status: Not on file  Intimate Partner Violence:   . Fear of Current or Ex-Partner: Not on file  . Emotionally Abused: Not on file  . Physically Abused: Not on file  . Sexually Abused: Not on file     BP 112/64   Pulse 72   Ht 5\' 7"  (1.702 m)   Wt 241 lb 12.8 oz (109.7 kg)   SpO2 96%   BMI 37.87 kg/m   Physical Exam:  Well appearing NAD HEENT: Unremarkable Neck:  No JVD, no thyromegally Lymphatics:  No adenopathy Back:  No CVA tenderness Lungs:  Clear with no wheezes HEART:  Regular rate rhythm, no murmurs, no rubs, no clicks Abd:  soft, positive bowel sounds, no organomegally, no rebound, no guarding Ext:  2 plus pulses, no edema, no cyanosis, no  clubbing Skin:  No rashes no nodules Neuro:  CN II through XII intact, motor grossly intact  EKG - NSR with PVC's  Assess/Plan: 1. PVC's - I have discussed the treatment options with the patient including uptitration of her beta blocker vs amiodarone/sotalol vs catheter ablation. She is considering her options. For now we will increase her metoprolol to 50 mg bid.

## 2020-01-13 ENCOUNTER — Telehealth: Payer: Self-pay | Admitting: Internal Medicine

## 2020-01-13 NOTE — Telephone Encounter (Signed)
Patient called and wanted to ask Boneta Lucks, Dr. Lubertha Basque nurse a question. The patient states it is not urgent, but she would like to talk to the nurse sometime today or tomorrow

## 2020-01-14 NOTE — Telephone Encounter (Signed)
Attempted to call Pt.  No VM available.

## 2020-01-20 NOTE — Telephone Encounter (Signed)
Returned call to pt.  Pt is interested in revisiting PVC ablation.  Appt made for 3/31 at 9;30 am to discuss.  Advised Pt to find out what her deductible for her plan is.

## 2020-01-22 ENCOUNTER — Encounter: Payer: Self-pay | Admitting: Internal Medicine

## 2020-01-22 ENCOUNTER — Ambulatory Visit (INDEPENDENT_AMBULATORY_CARE_PROVIDER_SITE_OTHER): Payer: 59 | Admitting: Internal Medicine

## 2020-01-22 ENCOUNTER — Other Ambulatory Visit: Payer: Self-pay

## 2020-01-22 VITALS — BP 100/64 | HR 71 | Ht 67.0 in | Wt 241.0 lb

## 2020-01-22 DIAGNOSIS — I493 Ventricular premature depolarization: Secondary | ICD-10-CM | POA: Diagnosis not present

## 2020-01-22 NOTE — Progress Notes (Signed)
HPI Ms. Gallo returns today for followup of symptomatic PVC's. She has been placed on the combination of metorpolol and flecainide and has tolerated these medications. She wore a repeat cardiac monitor on this medical therapy and still has over 25% PVC burden. No syncope and no chest pain. She has previously preserved LV function. She denies chest pain. She presents today to discuss other options. Allergies  Allergen Reactions  . Sulfa Antibiotics      Current Outpatient Medications  Medication Sig Dispense Refill  . flecainide (TAMBOCOR) 100 MG tablet Take 1 tablet (100 mg total) by mouth 2 (two) times daily. 180 tablet 3  . metoprolol tartrate (LOPRESSOR) 50 MG tablet Take 1 tablet (50 mg total) by mouth 2 (two) times daily. 180 tablet 3  . Multiple Vitamin (MULTIVITAMIN) capsule Take 1 capsule by mouth daily.    . traZODone (DESYREL) 50 MG tablet Take 1 tablet (50 mg total) by mouth at bedtime. 30 tablet 11  . Vitamin D, Ergocalciferol, (DRISDOL) 1.25 MG (50000 UT) CAPS capsule Take 50,000 Units by mouth every 7 (seven) days.     No current facility-administered medications for this visit.     Past Medical History:  Diagnosis Date  . Chronic maxillary sinusitis 08/21/2017  . Frequent PVCs   . Holter monitor, abnormal 08/22/2019  . 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 Rhinitis, chronic 08/21/2017  . Sinusitis 02/13/2018  . Snoring 08/21/2017    ROS:   All systems reviewed and negative except as noted in the HPI.   History reviewed. No pertinent surgical history.   Family History  Problem Relation Age of Onset  . Hypertension Mother   . Hyperlipidemia Mother   . Diabetes Mother   . Hypertension Father   . Hyperlipidemia Father   . Diabetes Father   . Heart attack Father 67  . Stroke Father      Social History   Socioeconomic History  . Marital status: Married    Spouse name: Not on file  . Number of children: 3  . Years of education:  Not on file  . Highest education level: Not on file  Occupational History  . Not on file  Tobacco Use  . Smoking status: Never Smoker  . Smokeless tobacco: Never Used  Substance and Sexual Activity  . Alcohol use: No    Alcohol/week: 0.0 standard drinks  . Drug use: No  . Sexual activity: Yes    Birth control/protection: Condom  Other Topics Concern  . Not on file  Social History Narrative  . Not on file   Social Determinants of Health   Financial Resource Strain:   . Difficulty of Paying Living Expenses:   Food Insecurity:   . Worried About Charity fundraiser in the Last Year:   . Arboriculturist in the Last Year:   Transportation Needs:   . Film/video editor (Medical):   Marland Kitchen Lack of Transportation (Non-Medical):   Physical Activity:   . Days of Exercise per Week:   . Minutes of Exercise per Session:   Stress:   . Feeling of Stress :   Social Connections:   . Frequency of Communication with Friends and Family:   . Frequency of Social Gatherings with Friends and Family:   . Attends Religious Services:   . Active Member of Clubs or Organizations:   . Attends Archivist Meetings:   Marland Kitchen Marital Status:   Intimate Partner Violence:   .  Fear of Current or Ex-Partner:   . Emotionally Abused:   Marland Kitchen Physically Abused:   . Sexually Abused:      BP 100/64   Pulse 71   Ht 5\' 7"  (1.702 m)   Wt 241 lb (109.3 kg)   SpO2 97%   BMI 37.75 kg/m   Physical Exam:  Well appearing NAD HEENT: Unremarkable Neck:  No JVD, no thyromegally Lymphatics:  No adenopathy Back:  No CVA tenderness Lungs:  Clear with no wheezes HEART:  Regular rate rhythm, no murmurs, no rubs, no clicks Abd:  soft, positive bowel sounds, no organomegally, no rebound, no guarding Ext:  2 plus pulses, no edema, no cyanosis, no clubbing Skin:  No rashes no nodules Neuro:  CN II through XII intact, motor grossly intact  EKG - nsr with trigeminal PVC's, left bundle, inferior axis, transition  at V3.    Assess/Plan: 1. PVC's - she has been refractory to medical therapy. I have discussed the treatment options including switching to amiodarone vs catheter ablation. I have reviewed the indications/risks/benefits/goals/expectations and she will call if she would like to proceed with ablation.  [place].D.

## 2020-01-22 NOTE — Patient Instructions (Addendum)
Medication Instructions:  Your physician recommends that you continue on your current medications as directed. Please refer to the Current Medication list given to you today.  Labwork: None ordered.  Testing/Procedures: Your physician has recommended that you have an ablation. Catheter ablation is a medical procedure used to treat some cardiac arrhythmias (irregular heartbeats). During catheter ablation, a long, thin, flexible tube is put into a blood vessel in your groin (upper thigh), or neck. This tube is called an ablation catheter. It is then guided to your heart through the blood vessel. Radio frequency waves destroy small areas of heart tissue where abnormal heartbeats may cause an arrhythmia to start. Please see the instruction sheet given to you today.   Follow-Up:  UPCOMING DATES AVAILABLE FOR YOUR PROCEDURE:  April 12, 19, 26 May 3, 10, 13  Any Other Special Instructions Will Be Listed Below (If Applicable).  If you need a refill on your cardiac medications before your next appointment, please call your pharmacy.    Cardiac Ablation Cardiac ablation is a procedure to disable (ablate) a small amount of heart tissue in very specific places. The heart has many electrical connections. Sometimes these connections are abnormal and can cause the heart to beat very fast or irregularly. Ablating some of the problem areas can improve the heart rhythm or return it to normal. Ablation may be done for people who:  Have Wolff-Parkinson-White syndrome.  Have fast heart rhythms (tachycardia).  Have taken medicines for an abnormal heart rhythm (arrhythmia) that were not effective or caused side effects.  Have a high-risk heartbeat that may be life-threatening. During the procedure, a small incision is made in the neck or the groin, and a long, thin, flexible tube (catheter) is inserted into the incision and moved to the heart. Small devices (electrodes) on the tip of the catheter will send  out electrical currents. A type of X-ray (fluoroscopy) will be used to help guide the catheter and to provide images of the heart.

## 2020-02-05 ENCOUNTER — Other Ambulatory Visit: Payer: Self-pay

## 2020-02-05 ENCOUNTER — Encounter: Payer: Self-pay | Admitting: Family Medicine

## 2020-02-05 ENCOUNTER — Ambulatory Visit (INDEPENDENT_AMBULATORY_CARE_PROVIDER_SITE_OTHER): Payer: 59 | Admitting: Family Medicine

## 2020-02-05 VITALS — BP 116/60 | HR 57 | Temp 97.6°F | Ht 67.0 in | Wt 241.2 lb

## 2020-02-05 DIAGNOSIS — G4700 Insomnia, unspecified: Secondary | ICD-10-CM | POA: Diagnosis not present

## 2020-02-05 DIAGNOSIS — G4733 Obstructive sleep apnea (adult) (pediatric): Secondary | ICD-10-CM

## 2020-02-05 DIAGNOSIS — Z9989 Dependence on other enabling machines and devices: Secondary | ICD-10-CM

## 2020-02-05 MED ORDER — TRAZODONE HCL 100 MG PO TABS: 100.0000 mg | ORAL_TABLET | Freq: Every day | ORAL | 3 refills | Status: DC

## 2020-02-05 NOTE — Patient Instructions (Signed)
We will increase trazodone to 100mg  every night. Consider adding Valerian Root if needed.   Sleep hygiene, adequate hydration, low glycemic diet and regular exercise advised  Follow up closely with PCP for any concerns of anxiety/depression  Follow up with me in 6-12 months   Insomnia Insomnia is a sleep disorder that makes it difficult to fall asleep or stay asleep. Insomnia can cause fatigue, low energy, difficulty concentrating, mood swings, and poor performance at work or school. There are three different ways to classify insomnia:  Difficulty falling asleep.  Difficulty staying asleep.  Waking up too early in the morning. Any type of insomnia can be long-term (chronic) or short-term (acute). Both are common. Short-term insomnia usually lasts for three months or less. Chronic insomnia occurs at least three times a week for longer than three months. What are the causes? Insomnia may be caused by another condition, situation, or substance, such as:  Anxiety.  Certain medicines.  Gastroesophageal reflux disease (GERD) or other gastrointestinal conditions.  Asthma or other breathing conditions.  Restless legs syndrome, sleep apnea, or other sleep disorders.  Chronic pain.  Menopause.  Stroke.  Abuse of alcohol, tobacco, or illegal drugs.  Mental health conditions, such as depression.  Caffeine.  Neurological disorders, such as Alzheimer's disease.  An overactive thyroid (hyperthyroidism). Sometimes, the cause of insomnia may not be known. What increases the risk? Risk factors for insomnia include:  Gender. Women are affected more often than men.  Age. Insomnia is more common as you get older.  Stress.  Lack of exercise.  Irregular work schedule or working night shifts.  Traveling between different time zones.  Certain medical and mental health conditions. What are the signs or symptoms? If you have insomnia, the main symptom is having trouble falling  asleep or having trouble staying asleep. This may lead to other symptoms, such as:  Feeling fatigued or having low energy.  Feeling nervous about going to sleep.  Not feeling rested in the morning.  Having trouble concentrating.  Feeling irritable, anxious, or depressed. How is this diagnosed? This condition may be diagnosed based on:  Your symptoms and medical history. Your health care provider may ask about: ? Your sleep habits. ? Any medical conditions you have. ? Your mental health.  A physical exam. How is this treated? Treatment for insomnia depends on the cause. Treatment may focus on treating an underlying condition that is causing insomnia. Treatment may also include:  Medicines to help you sleep.  Counseling or therapy.  Lifestyle adjustments to help you sleep better. Follow these instructions at home: Eating and drinking   Limit or avoid alcohol, caffeinated beverages, and cigarettes, especially close to bedtime. These can disrupt your sleep.  Do not eat a large meal or eat spicy foods right before bedtime. This can lead to digestive discomfort that can make it hard for you to sleep. Sleep habits   Keep a sleep diary to help you and your health care provider figure out what could be causing your insomnia. Write down: ? When you sleep. ? When you wake up during the night. ? How well you sleep. ? How rested you feel the next day. ? Any side effects of medicines you are taking. ? What you eat and drink.  Make your bedroom a dark, comfortable place where it is easy to fall asleep. ? Put up shades or blackout curtains to block light from outside. ? Use a white noise machine to block noise. ? Keep the temperature  cool.  Limit screen use before bedtime. This includes: ? Watching TV. ? Using your smartphone, tablet, or computer.  Stick to a routine that includes going to bed and waking up at the same times every day and night. This can help you fall asleep  faster. Consider making a quiet activity, such as reading, part of your nighttime routine.  Try to avoid taking naps during the day so that you sleep better at night.  Get out of bed if you are still awake after 15 minutes of trying to sleep. Keep the lights down, but try reading or doing a quiet activity. When you feel sleepy, go back to bed. General instructions  Take over-the-counter and prescription medicines only as told by your health care provider.  Exercise regularly, as told by your health care provider. Avoid exercise starting several hours before bedtime.  Use relaxation techniques to manage stress. Ask your health care provider to suggest some techniques that may work well for you. These may include: ? Breathing exercises. ? Routines to release muscle tension. ? Visualizing peaceful scenes.  Make sure that you drive carefully. Avoid driving if you feel very sleepy.  Keep all follow-up visits as told by your health care provider. This is important. Contact a health care provider if:  You are tired throughout the day.  You have trouble in your daily routine due to sleepiness.  You continue to have sleep problems, or your sleep problems get worse. Get help right away if:  You have serious thoughts about hurting yourself or someone else. If you ever feel like you may hurt yourself or others, or have thoughts about taking your own life, get help right away. You can go to your nearest emergency department or call:  Your local emergency services (911 in the [place]  A suicide crisis helpline, such as the [facility] at (332) 875-8104. This is open 24 hours a day. Summary  Insomnia is a sleep disorder that makes it difficult to fall asleep or stay asleep.  Insomnia can be long-term (chronic) or short-term (acute).  Treatment for insomnia depends on the cause. Treatment may focus on treating an underlying condition that is causing insomnia.  Keep a  sleep diary to help you and your health care provider figure out what could be causing your insomnia. This information is not intended to replace advice given to you by your health care provider. Make sure you discuss any questions you have with your health care provider. Document Revised: 09/22/2017 Document Reviewed: 07/20/2017 Elsevier Patient Education  [place].   Sleep Apnea Sleep apnea affects breathing during sleep. It causes breathing to stop for a short time or to become shallow. It can also increase the risk of:  Heart attack.  Stroke.  Being very overweight (obese).  Diabetes.  Heart failure.  Irregular heartbeat. The goal of treatment is to help you breathe normally again. What are the causes? There are three kinds of sleep apnea:  Obstructive sleep apnea. This is caused by a blocked or collapsed airway.  Central sleep apnea. This happens when the brain does not send the right signals to the muscles that control breathing.  Mixed sleep apnea. This is a combination of obstructive and central sleep apnea. The most common cause of this condition is a collapsed or blocked airway. This can happen if:  Your throat muscles are too relaxed.  Your tongue and tonsils are too large.  You are overweight.  Your airway is too small.  What increases the risk?  Being overweight.  Smoking.  Having a small airway.  Being older.  Being female.  Drinking alcohol.  Taking medicines to calm yourself (sedatives or tranquilizers).  Having family members with the condition. What are the signs or symptoms?  Trouble staying asleep.  Being sleepy or tired during the day.  Getting angry a lot.  Loud snoring.  Headaches in the morning.  Not being able to focus your mind (concentrate).  Forgetting things.  Less interest in sex.  Mood swings.  Personality changes.  Feelings of sadness (depression).  Waking up a lot during the night to pee (urinate).   Dry mouth.  Sore throat. How is this diagnosed?  Your medical history.  A physical exam.  A test that is done when you are sleeping (sleep study). The test is most often done in a sleep lab but may also be done at home. How is this treated?   Sleeping on your side.  Using a medicine to get rid of mucus in your nose (decongestant).  Avoiding the use of alcohol, medicines to help you relax, or certain pain medicines (narcotics).  Losing weight, if needed.  Changing your diet.  Not smoking.  Using a machine to open your airway while you sleep, such as: ? An oral appliance. This is a mouthpiece that shifts your lower jaw forward. ? A CPAP device. This device blows air through a mask when you breathe out (exhale). ? An EPAP device. This has valves that you put in each nostril. ? A BPAP device. This device blows air through a mask when you breathe in (inhale) and breathe out.  Having surgery if other treatments do not work. It is important to get treatment for sleep apnea. Without treatment, it can lead to:  High blood pressure.  Coronary artery disease.  In men, not being able to have an erection (impotence).  Reduced thinking ability. Follow these instructions at home: Lifestyle  Make changes that your doctor recommends.  Eat a healthy diet.  Lose weight if needed.  Avoid alcohol, medicines to help you relax, and some pain medicines.  Do not use any products that contain nicotine or tobacco, such as cigarettes, e-cigarettes, and chewing tobacco. If you need help quitting, ask your doctor. General instructions  Take over-the-counter and prescription medicines only as told by your doctor.  If you were given a machine to use while you sleep, use it only as told by your doctor.  If you are having surgery, make sure to tell your doctor you have sleep apnea. You may need to bring your device with you.  Keep all follow-up visits as told by your doctor. This is  important. Contact a doctor if:  The machine that you were given to use during sleep bothers you or does not seem to be working.  You do not get better.  You get worse. Get help right away if:  Your chest hurts.  You have trouble breathing in enough air.  You have an uncomfortable feeling in your back, arms, or stomach.  You have trouble talking.  One side of your body feels weak.  A part of your face is hanging down. These symptoms may be an emergency. Do not wait to see if the symptoms will go away. Get medical help right away. Call your local emergency services (911 in the [place] Do not drive yourself to the hospital. Summary  This condition affects breathing during sleep.  The most common cause  is a collapsed or blocked airway.  The goal of treatment is to help you breathe normally while you sleep. This information is not intended to replace advice given to you by your health care provider. Make sure you discuss any questions you have with your health care provider. Document Revised: 07/27/2018 Document Reviewed: 06/05/2018 Elsevier Patient Education  2020 [employer].

## 2020-02-05 NOTE — Progress Notes (Addendum)
CM sent to [facility]  For nw cpap orders. Received. sy

## 2020-02-05 NOTE — Progress Notes (Addendum)
PATIENT: Christy Holland DOB: 1971/01/10  REASON FOR VISIT: follow up HISTORY FROM: patient  Chief Complaint  Patient presents with  . Follow-up    rm 2, cpap DME [facility].  Still issues with staying asleep.       HISTORY OF PRESENT ILLNESS: Today 02/05/20 [name] is a 49 y.o. female here today for follow up.  She is doing well with CPAP therapy.  She is using her machine every night.  She continues to have difficulty with insomnia.  We started her on trazodone 50 mg at bedtime.  This has been helpful in helping her get to sleep but she continues to have trouble staying asleep.  She is interested in increasing the dose.  She has tried Benadryl and melatonin in the past with no relief.  She does not feel that anxiety or depression are contributing but is followed closely by her primary care provider.  Compliance report dated 01/06/2020 through 02/04/2020 reveals that she used CPAP 30 of the past 30 days for compliance of 100%.  She used CPAP greater than 4 hours 73.3% of the time.  Average usage was 4 hours and 59 minutes.  Residual AHI was 3.8 on 5 to 11 cm of water pressure and an EPR of 3.  There was no significant leak noted.   HISTORY: (copied from my note on 11/07/2019)  [name] is a 49 y.o. female here today for follow up for OSA on CPAP.  She reports that she is doing fairly well on CPAP therapy.  She is working towards nightly use and meeting the 4-hour recommendation.  She denies difficulty with CPAP machine.  She does continue to note insomnia.  She has tried over-the-counter options including melatonin and Benadryl with no success.  Compliance report dated 10/08/2019 through 11/06/2019 reveals that she used CPAP 28 of the last 30 days for compliance of 93.3%.  She used CPAP greater than 4 hours 83% of time.  Average usage was 5 hours and 27 minutes.  Residual AHI was 3.8 on 5 to 11 cm of water and an EPR of 3.  There was no significant leak noted.  HISTORY:  (copied from Dr Guadelupe Sabin note on 08/07/2019)  [name] is a 49 year old right-handed woman with an underlying medical history of rhinitis, recurrent sinusitis, PVCs, and obesity, who presents for follow-up consultation of her obstructive sleep apnea after recent home sleep testing. The patient is unaccompanied today. I first met her on 04/10/2019 at the request of her primary care physician, at which time she reported snoring and excessive daytime somnolence with an Epworth sleepiness score of 13 at the time. She was advised to proceed with a sleep study. She had a home sleep test on 05/08/2019 which indicated overall mild sleep apnea with a total AHI of 6.6/hour and O2 nadir of 85%. She was advised to start AutoPap therapy to see if her symptoms improve particularly her daytime somnolence and her snoring. She did not get set up with AutoPap therapy due to change in insurance. Today, 08/07/2019: She reports that she does not sleep well. She has had intermittent headaches which are throbbing. She has a remote history of migraines. She has some tension type headaches that are more global and tightness-like. She has tried Tylenol and Motrin. Her blood pressure is quite high today. She has not had an eye examination in 2 or 3 years. She has an appointment with the primary care physician. She has had some intermittent chest tightness, no  shortness of breath. She has not seen a cardiologist. She had a cardiac monitor and has been diagnosed with PVCs. She is on metoprolol for this. She does not check her blood pressure at home. She would be willing to get started on AutoPap therapy. She denies any sudden onset of one-sided weakness, numbness, tingling, slurring of speech or droopy face.  The patient's allergies, current medications, family history, past medical history, past social history, past surgical history and problem list were reviewed and updated as appropriate.  Previously:    04/10/2019: (She) reports snoring and excessive daytime somnolence. I reviewed your office note from 03/04/2019, which you kindly included. Her Epworth sleepiness score is 13 out of 24, fatigue severity score is 39 out of 63. She has had some difficulty initiating and maintaining sleep. She often wakes up in the middle of the night and has difficulty falling back asleep, she also wakes up in the early morning hours sometimes and has difficulty sleeping through the night for different reasons. She does have witnessed apneas per husband's report and loud snoring. She has woken herself up with a sense of gasping for air and does have morning headaches which are described as dull and achy. Her sister has sleep apnea and her mother has sleep apnea, both have CPAP machines. Her weight has fluctuated. 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 She lives at home with her husband and 3 children, oldest is 64, youngest is almost 96. They have no pets. She is a non-smoker and does not drink alcohol, does not drink caffeine daily. She has had PVCs and bradycardia. She is scheduled to have an exercise tolerance test next week. She follows with cardiology. She does not wake up rested. She has nocturia on average twice per night.    REVIEW OF SYSTEMS: Out of a complete 14 system review of symptoms, the patient complains only of the following symptoms, insomnia and all other reviewed systems are negative.  ESS: 16 FSS: 48   ALLERGIES: Allergies  Allergen Reactions  . Sulfa Antibiotics     HOME MEDICATIONS: Outpatient Medications Prior to Visit  Medication Sig Dispense Refill  . flecainide (TAMBOCOR) 100 MG tablet Take 1 tablet (100 mg total) by mouth 2 (two) times daily. 180 tablet 3  . metoprolol tartrate (LOPRESSOR) 50 MG tablet Take 1 tablet (50 mg total) by mouth 2 (two) times daily. 180 tablet 3  . Multiple Vitamin (MULTIVITAMIN) capsule Take 1 capsule by mouth daily.     . Vitamin D, Ergocalciferol, (DRISDOL) 1.25 MG (50000 UT) CAPS capsule Take 50,000 Units by mouth every 7 (seven) days.    . traZODone (DESYREL) 50 MG tablet Take 1 tablet (50 mg total) by mouth at bedtime. 30 tablet 11   No facility-administered medications prior to visit.    PAST MEDICAL HISTORY: Past Medical History:  Diagnosis Date  . Chronic maxillary sinusitis 08/21/2017  . Frequent PVCs   . Holter monitor, abnormal 08/22/2019  . 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 Rhinitis, chronic 08/21/2017  . Sinusitis 02/13/2018  . Snoring 08/21/2017    PAST SURGICAL HISTORY: History reviewed. No pertinent surgical history.  FAMILY HISTORY: Family History  Problem Relation Age of Onset  . Hypertension Mother   . Hyperlipidemia Mother   . Diabetes Mother   . Hypertension Father   . Hyperlipidemia Father   . Diabetes Father   . Heart attack Father 41  . Stroke Father     SOCIAL  HISTORY: Social History   Socioeconomic History  . Marital status: Married    Spouse name: Not on file  . Number of children: 3  . Years of education: Not on file  . Highest education level: Not on file  Occupational History  . Not on file  Tobacco Use  . Smoking status: Never Smoker  . Smokeless tobacco: Never Used  Substance and Sexual Activity  . Alcohol use: No    Alcohol/week: 0.0 standard drinks  . Drug use: No  . Sexual activity: Yes    Birth control/protection: Condom  Other Topics Concern  . Not on file  Social History Narrative  . Not on file   Social Determinants of Health   Financial Resource Strain:   . Difficulty of Paying Living Expenses:   Food Insecurity:   . Worried About Charity fundraiser in the Last Year:   . Arboriculturist in the Last Year:   Transportation Needs:   . Film/video editor (Medical):   Marland Kitchen Lack of Transportation (Non-Medical):   Physical Activity:   . Days of Exercise per Week:   . Minutes of Exercise per Session:   Stress:    . Feeling of Stress :   Social Connections:   . Frequency of Communication with Friends and Family:   . Frequency of Social Gatherings with Friends and Family:   . Attends Religious Services:   . Active Member of Clubs or Organizations:   . Attends Archivist Meetings:   Marland Kitchen Marital Status:   Intimate Partner Violence:   . Fear of Current or Ex-Partner:   . Emotionally Abused:   Marland Kitchen Physically Abused:   . Sexually Abused:       PHYSICAL EXAM  Vitals:   02/05/20 0944  BP: 116/60  Pulse: (!) 57  Temp: 97.6 F (36.4 C)  Weight: 241 lb 3.2 oz (109.4 kg)  Height: '5\' 7"'$  (1.702 m)   Body mass index is 37.78 kg/m.  Generalized: Well developed, in no acute distress  Cardiology: normal rate and rhythm, no murmur noted Respiratory: Clear to auscultation bilaterally Neurological examination  Mentation: Alert oriented to time, place, history taking. Follows all commands speech and language fluent Cranial nerve II-XII: Pupils were equal round reactive to light. Extraocular movements were full, visual field were full  Motor: The motor testing reveals 5 over 5 strength of all 4 extremities. Good symmetric motor tone is noted throughout.  Gait and station: Gait is normal.   DIAGNOSTIC DATA (LABS, IMAGING, TESTING) - I reviewed patient records, labs, notes, testing and imaging myself where available.  No flowsheet data found.   Lab Results  Component Value Date   WBC 5.8 02/13/2018   HGB 12.6 02/13/2018   HCT 38.6 02/13/2018   MCV 91.1 02/13/2018   PLT 204 07/31/2017      Component Value Date/Time   NA 137 07/31/2017 1712   K 4.5 07/31/2017 1712   CL 98 07/31/2017 1712   CO2 26 07/31/2017 1712   GLUCOSE 88 07/31/2017 1712   BUN 14 07/31/2017 1712   CREATININE 0.79 07/31/2017 1712   CALCIUM 9.3 07/31/2017 1712   PROT 6.9 07/31/2017 1712   ALBUMIN 4.2 07/31/2017 1712   AST 26 07/31/2017 1712   ALT 11 07/31/2017 1712   ALKPHOS 63 07/31/2017 1712   BILITOT <0.2  07/31/2017 1712   GFRNONAA 90 07/31/2017 1712   GFRAA 104 07/31/2017 1712   No results found for: CHOL, HDL, LDLCALC,  LDLDIRECT, TRIG, CHOLHDL No results found for: HGBA1C No results found for: VITAMINB12 Lab Results  Component Value Date   TSH 1.450 07/31/2017     ASSESSMENT AND PLAN 49 y.o. year old female  has a past medical history of Chronic maxillary sinusitis (08/21/2017), Frequent PVCs, Holter monitor, abnormal (08/22/2019), Hypertrophy of tonsil and adenoid (08/21/2017), Rhinitis, chronic (08/21/2017), Sinusitis (02/13/2018), and Snoring (08/21/2017). here with     ICD-10-CM   1. OSA on CPAP  G47.33 For home use only DME continuous positive airway pressure (CPAP)   Z99.89   2. Insomnia, unspecified type  G47.00     Rylyn is doing well with CPAP therapy.  Compliance report reveals excellent compliance with daily usage.  She continues to wake up at night and is unable to go back to sleep.  4-hour usage is 73%.  We will increase trazodone to 100 mg at bedtime.  She may add valerian root in the future if needed.  She was encouraged to monitor for any concerns of anxiety or depression that may be contributing.  Sleep hygiene reviewed.  Well-balanced diet and adequate hydration discussed.  Regular exercise will also help.  She is encouraged to continue using CPAP nightly and for greater than 4 hours each night.  She will continue close primary care.  She will follow-up with me in 6 to 12 months pending response to trazodone increase.  She verbalizes understanding and agreement with this plan.   Orders Placed This Encounter  Procedures  . For home use only DME continuous positive airway pressure (CPAP)    supplies    Order Specific Question:   Length of Need    Answer:   Lifetime    Order Specific Question:   Patient has OSA or probable OSA    Answer:   Yes    Order Specific Question:   Is the patient currently using CPAP in the home    Answer:   Yes    Order Specific Question:    Settings    Answer:   Other see comments    Order Specific Question:   CPAP supplies needed    Answer:   Mask, headgear, cushions, filters, heated tubing and water chamber     Meds ordered this encounter  Medications  . traZODone (DESYREL) 100 MG tablet    Sig: Take 1 tablet (100 mg total) by mouth at bedtime.    Dispense:  90 tablet    Refill:  3    Order Specific Question:   Supervising Provider    Answer:   Melvenia Beam [id number]      I spent 15 minutes with the patient. 50% of this time was spent counseling and educating patient on plan of care and medications.    Debbora Presto, FNP-C 02/05/2020, 10:21 AM [facility] [address], [place], [place] [address] [phone]  I reviewed the above note and documentation by the Nurse Practitioner and agree with the history, exam, assessment and plan as outlined above. I was available for consultation. Star Age, MD, PhD [facility])

## 2020-02-21 ENCOUNTER — Ambulatory Visit: Payer: Self-pay | Admitting: Cardiology

## 2020-03-04 ENCOUNTER — Ambulatory Visit: Payer: 59 | Admitting: Internal Medicine

## 2020-03-20 NOTE — Progress Notes (Signed)
No show

## 2020-03-25 ENCOUNTER — Ambulatory Visit: Payer: Self-pay | Admitting: Cardiology

## 2020-08-10 ENCOUNTER — Encounter: Payer: Self-pay | Admitting: Family Medicine

## 2020-08-10 ENCOUNTER — Ambulatory Visit (INDEPENDENT_AMBULATORY_CARE_PROVIDER_SITE_OTHER): Payer: 59 | Admitting: Family Medicine

## 2020-08-10 VITALS — BP 126/58 | HR 50 | Ht 67.0 in | Wt 224.0 lb

## 2020-08-10 DIAGNOSIS — Z9989 Dependence on other enabling machines and devices: Secondary | ICD-10-CM | POA: Diagnosis not present

## 2020-08-10 DIAGNOSIS — G4733 Obstructive sleep apnea (adult) (pediatric): Secondary | ICD-10-CM | POA: Diagnosis not present

## 2020-08-10 DIAGNOSIS — G4700 Insomnia, unspecified: Secondary | ICD-10-CM

## 2020-08-10 MED ORDER — TRAZODONE HCL 100 MG PO TABS: 150.0000 mg | ORAL_TABLET | Freq: Every day | ORAL | 3 refills | Status: DC

## 2020-08-10 NOTE — Patient Instructions (Addendum)
Please continue using your CPAP regularly. While your insurance requires that you use CPAP at least 4 hours each night on 70% of the nights, I recommend, that you not skip any nights and use it throughout the night if you can. Getting used to CPAP and staying with the treatment long term does take time and patience and discipline. Untreated obstructive sleep apnea when it is moderate to severe can have an adverse impact on cardiovascular health and raise her risk for heart disease, arrhythmias, hypertension, congestive heart failure, stroke and diabetes. Untreated obstructive sleep apnea causes sleep disruption, nonrestorative sleep, and sleep deprivation. This can have an impact on your day to day functioning and cause daytime sleepiness and impairment of cognitive function, memory loss, mood disturbance, and problems focussing. Using CPAP regularly can improve these symptoms.  We will try trazodone 150mg  daily. Take 1.5 tablets about 30-45 minutes before bedtime.   Consider discussion with PCP or cognitive behavioral therapy for sleep/anxiety concerns.   Follow up in 6 months    Quality Sleep Information, Adult Quality sleep is important for your mental and physical health. It also improves your quality of life. Quality sleep means you:  Are asleep for most of the time you are in bed.  Fall asleep within 30 minutes.  Wake up no more than once a night.  Are awake for no longer than 20 minutes if you do wake up during the night. Most adults need 7-8 hours of quality sleep each night. How can poor sleep affect me? If you do not get enough quality sleep, you may have:  Mood swings.  Daytime sleepiness.  Confusion.  Decreased reaction time.  Sleep disorders, such as insomnia and sleep apnea.  Difficulty with: ? Solving problems. ? Coping with stress. ? Paying attention. These issues may affect your performance and productivity at work, school, and at home. Lack of sleep may also  put you at higher risk for accidents, suicide, and risky behaviors. If you do not get quality sleep you may also be at higher risk for several health problems, including:  Infections.  Type 2 diabetes.  Heart disease.  High blood pressure.  Obesity.  Worsening of long-term conditions, like arthritis, kidney disease, depression, Parkinson's disease, and epilepsy. What actions can I take to get more quality sleep?      Stick to a sleep schedule. Go to sleep and wake up at about the same time each day. Do not try to sleep less on weekdays and make up for lost sleep on weekends. This does not work.  Try to get about 30 minutes of exercise on most days. Do not exercise 2-3 hours before going to bed.  Limit naps during the day to 30 minutes or less.  Do not use any products that contain nicotine or tobacco, such as cigarettes or e-cigarettes. If you need help quitting, ask your health care provider.  Do not drink caffeinated beverages for at least 8 hours before going to bed. Coffee, tea, and some sodas contain caffeine.  Do not drink alcohol close to bedtime.  Do not eat large meals close to bedtime.  Do not take naps in the late afternoon.  Try to get at least 30 minutes of sunlight every day. Morning sunlight is best.  Make time to relax before bed. Reading, listening to music, or taking a hot bath promotes quality sleep.  Make your bedroom a place that promotes quality sleep. Keep your bedroom dark, quiet, and at a comfortable room  temperature. Make sure your bed is comfortable. Take out sleep distractions like TV, a computer, smartphone, and bright lights.  If you are lying awake in bed for longer than 20 minutes, get up and do a relaxing activity until you feel sleepy.  Work with your health care provider to treat medical conditions that may affect sleeping, such as: ? Nasal obstruction. ? Snoring. ? Sleep apnea and other sleep disorders.  Talk to your health care  provider if you think any of your prescription medicines may cause you to have difficulty falling or staying asleep.  If you have sleep problems, talk with a sleep consultant. If you think you have a sleep disorder, talk with your health care provider about getting evaluated by a specialist. Where to find more information  National Sleep Foundation website: https://sleepfoundation.org  National Heart, Lung, and Blood Institute (NHLBI): https://hall.info/.pdf  Centers for Disease Control and Prevention (CDC): DetailSports.is Contact a health care provider if you:  Have trouble getting to sleep or staying asleep.  Often wake up very early in the morning and cannot get back to sleep.  Have daytime sleepiness.  Have daytime sleep attacks of suddenly falling asleep and sudden muscle weakness (narcolepsy).  Have a tingling sensation in your legs with a strong urge to move your legs (restless legs syndrome).  Stop breathing briefly during sleep (sleep apnea).  Think you have a sleep disorder or are taking a medicine that is affecting your quality of sleep. Summary  Most adults need 7-8 hours of quality sleep each night.  Getting enough quality sleep is an important part of health and well-being.  Make your bedroom a place that promotes quality sleep and avoid things that may cause you to have poor sleep, such as alcohol, caffeine, smoking, and large meals.  Talk to your health care provider if you have trouble falling asleep or staying asleep. This information is not intended to replace advice given to you by your health care provider. Make sure you discuss any questions you have with your health care provider. Document Revised: 01/17/2018 Document Reviewed: 01/17/2018 Elsevier Patient Education  2020 Elsevier Inc.   Sleep Apnea Sleep apnea affects breathing during sleep. It causes breathing to stop for a short time or to become  shallow. It can also increase the risk of:  Heart attack.  Stroke.  Being very overweight (obese).  Diabetes.  Heart failure.  Irregular heartbeat. The goal of treatment is to help you breathe normally again. What are the causes? There are three kinds of sleep apnea:  Obstructive sleep apnea. This is caused by a blocked or collapsed airway.  Central sleep apnea. This happens when the brain does not send the right signals to the muscles that control breathing.  Mixed sleep apnea. This is a combination of obstructive and central sleep apnea. The most common cause of this condition is a collapsed or blocked airway. This can happen if:  Your throat muscles are too relaxed.  Your tongue and tonsils are too large.  You are overweight.  Your airway is too small. What increases the risk?  Being overweight.  Smoking.  Having a small airway.  Being older.  Being female.  Drinking alcohol.  Taking medicines to calm yourself (sedatives or tranquilizers).  Having family members with the condition. What are the signs or symptoms?  Trouble staying asleep.  Being sleepy or tired during the day.  Getting angry a lot.  Loud snoring.  Headaches in the morning.  Not being able  to focus your mind (concentrate).  Forgetting things.  Less interest in sex.  Mood swings.  Personality changes.  Feelings of sadness (depression).  Waking up a lot during the night to pee (urinate).  Dry mouth.  Sore throat. How is this diagnosed?  Your medical history.  A physical exam.  A test that is done when you are sleeping (sleep study). The test is most often done in a sleep lab but may also be done at home. How is this treated?   Sleeping on your side.  Using a medicine to get rid of mucus in your nose (decongestant).  Avoiding the use of alcohol, medicines to help you relax, or certain pain medicines (narcotics).  Losing weight, if needed.  Changing your  diet.  Not smoking.  Using a machine to open your airway while you sleep, such as: ? An oral appliance. This is a mouthpiece that shifts your lower jaw forward. ? A CPAP device. This device blows air through a mask when you breathe out (exhale). ? An EPAP device. This has valves that you put in each nostril. ? A BPAP device. This device blows air through a mask when you breathe in (inhale) and breathe out.  Having surgery if other treatments do not work. It is important to get treatment for sleep apnea. Without treatment, it can lead to:  High blood pressure.  Coronary artery disease.  In men, not being able to have an erection (impotence).  Reduced thinking ability. Follow these instructions at home: Lifestyle  Make changes that your doctor recommends.  Eat a healthy diet.  Lose weight if needed.  Avoid alcohol, medicines to help you relax, and some pain medicines.  Do not use any products that contain nicotine or tobacco, such as cigarettes, e-cigarettes, and chewing tobacco. If you need help quitting, ask your doctor. General instructions  Take over-the-counter and prescription medicines only as told by your doctor.  If you were given a machine to use while you sleep, use it only as told by your doctor.  If you are having surgery, make sure to tell your doctor you have sleep apnea. You may need to bring your device with you.  Keep all follow-up visits as told by your doctor. This is important. Contact a doctor if:  The machine that you were given to use during sleep bothers you or does not seem to be working.  You do not get better.  You get worse. Get help right away if:  Your chest hurts.  You have trouble breathing in enough air.  You have an uncomfortable feeling in your back, arms, or stomach.  You have trouble talking.  One side of your body feels weak.  A part of your face is hanging down. These symptoms may be an emergency. Do not wait to see if  the symptoms will go away. Get medical help right away. Call your local emergency services (911 in the [place] Do not drive yourself to the hospital. Summary  This condition affects breathing during sleep.  The most common cause is a collapsed or blocked airway.  The goal of treatment is to help you breathe normally while you sleep. This information is not intended to replace advice given to you by your health care provider. Make sure you discuss any questions you have with your health care provider. Document Revised: 07/27/2018 Document Reviewed: 06/05/2018 Elsevier Patient Education  2020 [employer].

## 2020-08-10 NOTE — Progress Notes (Signed)
PATIENT: Christy Holland DOB: 1971/03/26  REASON FOR VISIT: follow up HISTORY FROM: patient  Chief Complaint  Patient presents with  . Follow-up    CPAP - DME [facility] trazadone 100mg  puts to sleep, but still awakens.      HISTORY OF PRESENT ILLNESS: Today 08/10/20 Christy Holland is a 49 y.o. female here today for follow up for OSA on CPAP. She has continues intermittent use of CPAP therapy. She continues trazodone 100mg  for insomnia. She feels that she is able to go to sleep but unable to stay asleep. She usually has to use the restroom and is unable to get back to sleep. She usually takes trazodone around 9:30, goes to bed around 10pm. Sometimes she may fall asleep watching TV, sometimes not. She usually wakes around 2am and is awake for about an hour.  Sometimes she may drift back off to sleep around 3 AM and then wakes at 5:30 AM.  She does not feel anxious or depressed throughout the day.  She admits to inconsistent sleep patterns as her mother is ill.  [place].  She has not been using CPAP when traveling.  Compliance report dated 07/06/2020 through 08/04/2020 reveals that she used CPAP 19 of the past 30 days for compliance of 63%.  She is CPAP greater than 4 hours 53% of the time.  Residual AHI was 2.6 on 5 to 11 cm of water.  No significant leak noted.   HISTORY: (copied from my note on 02/05/2020)  Christy Holland is a 49 y.o. female here today for follow up.  She is doing well with CPAP therapy.  She is using her machine every night.  She continues to have difficulty with insomnia.  We started her on trazodone 50 mg at bedtime.  This has been helpful in helping her get to sleep but she continues to have trouble staying asleep.  She is interested in increasing the dose.  She has tried Benadryl and melatonin in the past with no relief.  She does not feel that anxiety or depression are contributing but is followed closely by her  primary care provider.  Compliance report dated 01/06/2020 through 02/04/2020 reveals that she used CPAP 30 of the past 30 days for compliance of 100%.  She used CPAP greater than 4 hours 73.3% of the time.  Average usage was 4 hours and 59 minutes.  Residual AHI was 3.8 on 5 to 11 cm of water pressure and an EPR of 3.  There was no significant leak noted.   HISTORY: (copied from my note on 11/07/2019)  Ventura Atwateris a 49 y.o.femalehere today for follow up for OSA on CPAP.She reports that she is doing fairly well on CPAP therapy. She is working towards nightly use and meeting the 4-hour recommendation. She denies difficulty with CPAP machine. She does continue to note insomnia. She has tried over-the-counter options including melatonin and Benadryl with no success.  Compliance report dated 10/08/2019 through 11/06/2019 reveals that she used CPAP 28 of the last 30 days for compliance of 93.3%. She used CPAP greater than 4 hours 83% of time. Average usage was 5 hours and 27 minutes. Residual AHI was 3.8 on 5 to 11 cm of water and an EPR of 3. There was no significant leak noted.   REVIEW OF SYSTEMS: Out of a complete 14 system review of symptoms, the patient complains only of the following symptoms, insomnia and all other reviewed  systems are negative.  ESS: 13  ALLERGIES: Allergies  Allergen Reactions  . Sulfa Antibiotics     HOME MEDICATIONS: Outpatient Medications Prior to Visit  Medication Sig Dispense Refill  . AUROVELA 24 FE 1-20 MG-MCG(24) tablet Take 1 tablet by mouth daily.    . flecainide (TAMBOCOR) 100 MG tablet Take 1 tablet (100 mg total) by mouth 2 (two) times daily. 180 tablet 3  . metoprolol tartrate (LOPRESSOR) 50 MG tablet Take 1 tablet (50 mg total) by mouth 2 (two) times daily. 180 tablet 3  . Multiple Vitamin (MULTIVITAMIN) capsule Take 1 capsule by mouth daily.    . Vitamin D, Ergocalciferol, (DRISDOL) 1.25 MG (50000 UT) CAPS capsule Take 50,000  Units by mouth every 7 (seven) days.    . traZODone (DESYREL) 100 MG tablet Take 1 tablet (100 mg total) by mouth at bedtime. 90 tablet 3   No facility-administered medications prior to visit.    PAST MEDICAL HISTORY: Past Medical History:  Diagnosis Date  . Chronic maxillary sinusitis 08/21/2017  . Frequent PVCs   . Holter monitor, abnormal 08/22/2019  . 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 Rhinitis, chronic 08/21/2017  . Sinusitis 02/13/2018  . Snoring 08/21/2017    PAST SURGICAL HISTORY: History reviewed. No pertinent surgical history.  FAMILY HISTORY: Family History  Problem Relation Age of Onset  . Hypertension Mother   . Hyperlipidemia Mother   . Diabetes Mother   . Hypertension Father   . Hyperlipidemia Father   . Diabetes Father   . Heart attack Father 45  . Stroke Father     SOCIAL HISTORY: Social History   Socioeconomic History  . Marital status: Married    Spouse name: Not on file  . Number of children: 3  . Years of education: Not on file  . Highest education level: Not on file  Occupational History  . Not on file  Tobacco Use  . Smoking status: Never Smoker  . Smokeless tobacco: Never Used  Substance and Sexual Activity  . Alcohol use: No    Alcohol/week: 0.0 standard drinks  . Drug use: No  . Sexual activity: Yes    Birth control/protection: Condom  Other Topics Concern  . Not on file  Social History Narrative  . Not on file   Social Determinants of Health   Financial Resource Strain:   . Difficulty of Paying Living Expenses: Not on file  Food Insecurity:   . Worried About Programme researcher, broadcasting/film/video in the Last Year: Not on file  . Ran Out of Food in the Last Year: Not on file  Transportation Needs:   . Lack of Transportation (Medical): Not on file  . Lack of Transportation (Non-Medical): Not on file  Physical Activity:   . Days of Exercise per Week: Not on file  . Minutes of Exercise per Session: Not on file  Stress:   .  Feeling of Stress : Not on file  Social Connections:   . Frequency of Communication with Friends and Family: Not on file  . Frequency of Social Gatherings with Friends and Family: Not on file  . Attends Religious Services: Not on file  . Active Member of Clubs or Organizations: Not on file  . Attends Banker Meetings: Not on file  . Marital Status: Not on file  Intimate Partner Violence:   . Fear of Current or Ex-Partner: Not on file  . Emotionally Abused: Not on file  . Physically Abused: Not on file  .  Sexually Abused: Not on file     PHYSICAL EXAM  Vitals:   08/10/20 1456  BP: (!) 126/58  Pulse: (!) 50  Weight: 224 lb (101.6 kg)  Height: 5\' 7"  (1.702 m)   Body mass index is 35.08 kg/m.  Generalized: Well developed, in no acute distress  Cardiology: normal rate and rhythm, no murmur noted Respiratory: clear to auscultation bilaterally  Neurological examination  Mentation: Alert oriented to time, place, history taking. Follows all commands speech and language fluent Cranial nerve II-XII: Pupils were equal round reactive to light. Extraocular movements were full, visual field were full  Motor: The motor testing reveals 5 over 5 strength of all 4 extremities. Good symmetric motor tone is noted throughout.  Gait and station: Gait is normal.    DIAGNOSTIC DATA (LABS, IMAGING, TESTING) - I reviewed patient records, labs, notes, testing and imaging myself where available.  No flowsheet data found.   Lab Results  Component Value Date   WBC 5.8 02/13/2018   HGB 12.6 02/13/2018   HCT 38.6 02/13/2018   MCV 91.1 02/13/2018   PLT 204 07/31/2017      Component Value Date/Time   NA 137 07/31/2017 1712   K 4.5 07/31/2017 1712   CL 98 07/31/2017 1712   CO2 26 07/31/2017 1712   GLUCOSE 88 07/31/2017 1712   BUN 14 07/31/2017 1712   CREATININE 0.79 07/31/2017 1712   CALCIUM 9.3 07/31/2017 1712   PROT 6.9 07/31/2017 1712   ALBUMIN 4.2 07/31/2017 1712   AST 26  07/31/2017 1712   ALT 11 07/31/2017 1712   ALKPHOS 63 07/31/2017 1712   BILITOT <0.2 07/31/2017 1712   GFRNONAA 90 07/31/2017 1712   GFRAA 104 07/31/2017 1712   No results found for: CHOL, HDL, LDLCALC, LDLDIRECT, TRIG, CHOLHDL No results found for: 09/30/2017 No results found for: VITAMINB12 Lab Results  Component Value Date   TSH 1.450 07/31/2017     ASSESSMENT AND PLAN 49 y.o. year old female  has a past medical history of Chronic maxillary sinusitis (08/21/2017), Frequent PVCs, Holter monitor, abnormal (08/22/2019), Hypertrophy of tonsil and adenoid (08/21/2017), Rhinitis, chronic (08/21/2017), Sinusitis (02/13/2018), and Snoring (08/21/2017). here with     ICD-10-CM   1. OSA on CPAP  G47.33    Z99.89   2. Insomnia, unspecified type  G47.00      Kikue Gerhart continues to struggle with insomnia and consistent use of CPAP therapy.  Compliance report reveals suboptimal compliance.  She was encouraged to continue using CPAP nightly and for greater than 4 hours each night. We will update supply orders as indicated. Risks of untreated sleep apnea review and education materials provided.  She continues to struggle with insomnia as well.  She does feel that increased dose of trazodone helped with getting her to sleep but she continues to wake up 3 to 4 hours after falling asleep.  She request to increase trazodone to 150 mg.  We have had a lengthy discussion regarding the recalls of insomnia.  I feel that inconsistent sleep patterns in combination with increased stress levels are contributing.  She has not reached out to primary care as she does not feel that this is affecting her day-to-day life.  We will increase trazodone to 150 mg every night about 30 to 45 minutes before bedtime.  We have reviewed sleep hygiene and I have encouraged her to work on these at home.  She will reassess in 3 months.  I have encouraged her to reach out  to her primary care should stress levels continue to be a  concern.  May consider behavioral cognitive therapy if she wishes.  Healthy lifestyle habits encouraged. She will follow up in 3-6 months, sooner if needed. She verbalizes understanding and agreement with this plan.    No orders of the defined types were placed in this encounter.    Meds ordered this encounter  Medications  . traZODone (DESYREL) 100 MG tablet    Sig: Take 1.5 tablets (150 mg total) by mouth at bedtime.    Dispense:  135 tablet    Refill:  3    Order Specific Question:   Supervising Provider    Answer:   Anson Fret [id number]      I spent 15 minutes with the patient. 50% of this time was spent counseling and educating patient on plan of care and medications.    Shawnie Dapper, FNP-C 08/10/2020, 4:21 PM [facility] [address], [address] [place], [place] [address] [phone]

## 2020-09-30 ENCOUNTER — Institutional Professional Consult (permissible substitution): Payer: 59 | Admitting: Pulmonary Disease

## 2020-10-22 ENCOUNTER — Other Ambulatory Visit: Payer: Self-pay

## 2020-10-22 DIAGNOSIS — Z20822 Contact with and (suspected) exposure to covid-19: Secondary | ICD-10-CM

## 2020-10-24 LAB — SARS-COV-2, NAA 2 DAY TAT

## 2020-10-24 LAB — NOVEL CORONAVIRUS, NAA: SARS-CoV-2, NAA: NOT DETECTED

## 2020-11-11 NOTE — Progress Notes (Addendum)
PATIENT: Christy Holland DOB: [birth year]  REASON FOR VISIT: follow up HISTORY FROM: patient  Chief Complaint  Patient presents with  . Follow-up    RM 2 Alone PT is well, sleep about the same but not staying asleep all night      HISTORY OF PRESENT ILLNESS: Today 11/12/20  Christy Holland returns for follow up on OSA and insomnia. We increased trazodone to 150mg  at bedtime in 07/2020. It helps to get her to sleep but continuing to have trouble staying asleep. She usually has to use the bathroom and then has difficulty getting back to sleep. She gets 3-4 ours each night. She feels that her mind starts working and she can't return to sleep. She continues to care for her mother who is ill. She travels to [facility] often to take care of her. Sometimes she does not take her CPAP machine with her. She denies any concerns with CPAP machine or supplies. No difficulty with tolerability of therapy.   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 Cardiology workup showed 30% PVC burden but no other significant findings. She was started on flecainide and metoprolol but discontinued these medications several months ago. She is uncertain why, She does not have PCP. Last labs in 2018.   Compliance report dated 10/03/2020 through 11/03/2020 reveals that she used CPAP 20 one of the past 30 days for compliance of 65.6%.  She used CPAP greater than 4 hours 59.4%.  Residual AHI was 2.7 on 5 to 11 cm of water.  There was no significant leak noted.   08/10/2020 ALL: Christy Holland is a 50 y.o. female here today for follow up for OSA on CPAP. She has continues intermittent use of CPAP therapy. She continues trazodone 100mg  for insomnia. She feels that she is able to go to sleep but unable to stay asleep. She usually has to use the restroom and is unable to get back to sleep. She usually takes trazodone around 9:30, goes to bed around 10pm. Sometimes she may fall asleep watching TV, sometimes not. She  usually wakes around 2am and is awake for about an hour.  Sometimes she may drift back off to sleep around 3 AM and then wakes at 5:30 AM.  She does not feel anxious or depressed throughout the day.  She admits to inconsistent sleep patterns as her mother is ill.  She has been traveling to [facility] to help take care of her.  She has not been using CPAP when traveling.  Compliance report dated 07/06/2020 through 08/04/2020 reveals that she used CPAP 19 of the past 30 days for compliance of 63%.  She is CPAP greater than 4 hours 53% of the time.  Residual AHI was 2.6 on 5 to 11 cm of water.  No significant leak noted.   HISTORY: (copied from my note on 02/05/2020)  Christy Holland is a 50 y.o. female here today for follow up.  She is doing well with CPAP therapy.  She is using her machine every night.  She continues to have difficulty with insomnia.  We started her on trazodone 50 mg at bedtime.  This has been helpful in helping her get to sleep but she continues to have trouble staying asleep.  She is interested in increasing the dose.  She has tried Benadryl and melatonin in the past with no relief.  She does not feel that anxiety or depression are contributing but is followed closely by her primary care provider.  Compliance report dated 01/06/2020 through 02/04/2020 reveals that she used CPAP 30 of the past 30 days for compliance of 100%.  She used CPAP greater than 4 hours 73.3% of the time.  Average usage was 4 hours and 59 minutes.  Residual AHI was 3.8 on 5 to 11 cm of water pressure and an EPR of 3.  There was no significant leak noted.   HISTORY: (copied from my note on 11/07/2019)  Christy Holland a 50 y.o.femalehere today for follow up for OSA on CPAP.She reports that she is doing fairly well on CPAP therapy. She is working towards nightly use and meeting the 4-hour recommendation. She denies difficulty with CPAP machine. She does continue to note insomnia. She has tried  over-the-counter options including melatonin and Benadryl with no success.  Compliance report dated 10/08/2019 through 11/06/2019 reveals that she used CPAP 28 of the last 30 days for compliance of 93.3%. She used CPAP greater than 4 hours 83% of time. Average usage was 5 hours and 27 minutes. Residual AHI was 3.8 on 5 to 11 cm of water and an EPR of 3. There was no significant leak noted.   REVIEW OF SYSTEMS: Out of a complete 14 system review of symptoms, the patient complains only of the following symptoms, insomnia and all other reviewed systems are negative.  ESS: 9   ALLERGIES: Allergies  Allergen Reactions  . Sulfa Antibiotics     HOME MEDICATIONS: Outpatient Medications Prior to Visit  Medication Sig Dispense Refill  . Multiple Vitamin (MULTIVITAMIN) capsule Take 1 capsule by mouth daily.    . traZODone (DESYREL) 100 MG tablet Take 1.5 tablets (150 mg total) by mouth at bedtime. 135 tablet 3  . Vitamin D, Ergocalciferol, (DRISDOL) 1.25 MG (50000 UT) CAPS capsule Take 50,000 Units by mouth every 7 (seven) days.    . flecainide (TAMBOCOR) 100 MG tablet Take 1 tablet (100 mg total) by mouth 2 (two) times daily. (Patient not taking: Reported on 11/12/2020) 180 tablet 3  . metoprolol tartrate (LOPRESSOR) 50 MG tablet Take 1 tablet (50 mg total) by mouth 2 (two) times daily. (Patient not taking: Reported on 11/12/2020) 180 tablet 3  . AUROVELA 24 FE 1-20 MG-MCG(24) tablet Take 1 tablet by mouth daily.     No facility-administered medications prior to visit.    PAST MEDICAL HISTORY: Past Medical History:  Diagnosis Date  . Chronic maxillary sinusitis 08/21/2017  . Frequent PVCs   . Holter monitor, abnormal 08/22/2019  . 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 Rhinitis, chronic 08/21/2017  . Sinusitis 02/13/2018  . Snoring 08/21/2017    PAST SURGICAL HISTORY: History reviewed. No pertinent surgical history.  FAMILY HISTORY: Family History  Problem Relation Age  of Onset  . Hypertension Mother   . Hyperlipidemia Mother   . Diabetes Mother   . Hypertension Father   . Hyperlipidemia Father   . Diabetes Father   . Heart attack Father 69  . Stroke Father     SOCIAL HISTORY: Social History   Socioeconomic History  . Marital status: Married    Spouse name: Not on file  . Number of children: 3  . Years of education: Not on file  . Highest education level: Not on file  Occupational History  . Not on file  Tobacco Use  . Smoking status: Never Smoker  . Smokeless tobacco: Never Used  Substance and Sexual Activity  . Alcohol use: No    Alcohol/week: 0.0 standard drinks  . Drug  use: No  . Sexual activity: Yes    Birth control/protection: Condom  Other Topics Concern  . Not on file  Social History Narrative  . Not on file   Social Determinants of Health   Financial Resource Strain: Not on file  Food Insecurity: Not on file  Transportation Needs: Not on file  Physical Activity: Not on file  Stress: Not on file  Social Connections: Not on file  Intimate Partner Violence: Not on file     PHYSICAL EXAM  Vitals:   11/12/20 0814  BP: 138/78  Pulse: 87  Weight: 219 lb 3.2 oz (99.4 kg)  Height: 5\' 7"  (1.702 m)   Body mass index is 34.33 kg/m.  Generalized: Well developed, in no acute distress  Cardiology: normal rate and rhythm, no murmur noted Respiratory: clear to auscultation bilaterally  Neurological examination  Mentation: Alert oriented to time, place, history taking. Follows all commands speech and language fluent Cranial nerve II-XII: Pupils were equal round reactive to light. Extraocular movements were full, visual field were full  Motor: The motor testing reveals 5 over 5 strength of all 4 extremities. Good symmetric motor tone is noted throughout.  Gait and station: Gait is normal.    DIAGNOSTIC DATA (LABS, IMAGING, TESTING) - I reviewed patient records, labs, notes, testing and imaging myself where  available.  No flowsheet data found.   Lab Results  Component Value Date   WBC 5.8 02/13/2018   HGB 12.6 02/13/2018   HCT 38.6 02/13/2018   MCV 91.1 02/13/2018   PLT 204 07/31/2017      Component Value Date/Time   NA 137 07/31/2017 1712   K 4.5 07/31/2017 1712   CL 98 07/31/2017 1712   CO2 26 07/31/2017 1712   GLUCOSE 88 07/31/2017 1712   BUN 14 07/31/2017 1712   CREATININE 0.79 07/31/2017 1712   CALCIUM 9.3 07/31/2017 1712   PROT 6.9 07/31/2017 1712   ALBUMIN 4.2 07/31/2017 1712   AST 26 07/31/2017 1712   ALT 11 07/31/2017 1712   ALKPHOS 63 07/31/2017 1712   BILITOT <0.2 07/31/2017 1712   GFRNONAA 90 07/31/2017 1712   GFRAA 104 07/31/2017 1712   No results found for: CHOL, HDL, LDLCALC, LDLDIRECT, TRIG, CHOLHDL No results found for: [id number] No results found for: VITAMINB12 Lab Results  Component Value Date   TSH 1.450 07/31/2017     ASSESSMENT AND PLAN 50 y.o. year old female  has a past medical history of Chronic maxillary sinusitis (08/21/2017), Frequent PVCs, Holter monitor, abnormal (08/22/2019), Hypertrophy of tonsil and adenoid (08/21/2017), Rhinitis, chronic (08/21/2017), Sinusitis (02/13/2018), and Snoring (08/21/2017). here with     ICD-10-CM   1. OSA on CPAP  G47.33    Z99.89   2. Insomnia, unspecified type  G47.00 Ambulatory referral to Psychiatry     Christy Holland continues to struggle with insomnia and consistent use of CPAP therapy.  Compliance report reveals suboptimal compliance.  She was encouraged to continue using CPAP nightly and for greater than 4 hours each night. We will update supply orders as indicated. Risks of untreated sleep apnea review and education materials provided.  She continues to struggle with insomnia as well.  She does feel that increased dose of trazodone helped with getting her to sleep but she continues to wake up 3 to 4 hours after falling asleep despite increase in trazodone until 150 mg nightly.  We have continued our  discussion regarding the causes of insomnia.  I feel that inconsistent sleep patterns  in combination with increased stress levels are contributing.  She does not have a primary care provider.  She does have a history of vitamin D deficiency as well as frequent PVCs.  She is no longer on beta-blocker or antiarrhythmic.  TSH was normal in 2018 but has not had labs since.  I have reiterated the importance of having a primary care provider to help check in on her routinely.  We will continue trazodone to 150 mg every night about 30 to 45 minutes before bedtime.  We have reviewed sleep hygiene and I have encouraged her to work on these at home.  I have also placed a referral today for sleep psychiatry.  Healthy lifestyle habits encouraged. She will follow up in 6 months, sooner if needed. She verbalizes understanding and agreement with this plan.    Orders Placed This Encounter  Procedures  . Ambulatory referral to Psychiatry    Referral Priority:   Routine    Referral Type:   Psychiatric    Referral Reason:   Specialty Services Required    Requested Specialty:   Psychiatry    Number of Visits Requested:   1     No orders of the defined types were placed in this encounter.     I spent 30 minutes with the patient. 50% of this time was spent counseling and educating patient on plan of care and medications.    Shawnie Dapper, FNP-C 11/12/2020, 9:24 AM [facility] [address] [place], [place] [address] [phone]  I reviewed the above note and documentation by the Nurse Practitioner and agree with the history, exam, assessment and plan as outlined above. I was available for consultation. Huston Foley, MD, PhD [facility] [place])

## 2020-11-11 NOTE — Patient Instructions (Addendum)
Please continue using your CPAP regularly. While your insurance requires that you use CPAP at least 4 hours each night on 70% of the nights, I recommend, that you not skip any nights and use it throughout the night if you can. Getting used to CPAP and staying with the treatment long term does take time and patience and discipline. Untreated obstructive sleep apnea when it is moderate to severe can have an adverse impact on cardiovascular health and raise her risk for heart disease, arrhythmias, hypertension, congestive heart failure, stroke and diabetes. Untreated obstructive sleep apnea causes sleep disruption, nonrestorative sleep, and sleep deprivation. This can have an impact on your day to day functioning and cause daytime sleepiness and impairment of cognitive function, memory loss, mood disturbance, and problems focussing. Using CPAP regularly can improve these symptoms.  Follow up in 6 months    Quality Sleep Information, Adult Quality sleep is important for your mental and physical health. It also improves your quality of life. Quality sleep means you:  Are asleep for most of the time you are in bed.  Fall asleep within 30 minutes.  Wake up no more than once a night.  Are awake for no longer than 20 minutes if you do wake up during the night. Most adults need 7-8 hours of quality sleep each night. How can poor sleep affect me? If you do not get enough quality sleep, you may have:  Mood swings.  Daytime sleepiness.  Confusion.  Decreased reaction time.  Sleep disorders, such as insomnia and sleep apnea.  Difficulty with: ? Solving problems. ? Coping with stress. ? Paying attention. These issues may affect your performance and productivity at work, school, and at home. Lack of sleep may also put you at higher risk for accidents, suicide, and risky behaviors. If you do not get quality sleep you may also be at higher risk for several health problems,  including:  Infections.  Type 2 diabetes.  Heart disease.  High blood pressure.  Obesity.  Worsening of long-term conditions, like arthritis, kidney disease, depression, Parkinson's disease, and epilepsy. What actions can I take to get more quality sleep?  Stick to a sleep schedule. Go to sleep and wake up at about the same time each day. Do not try to sleep less on weekdays and make up for lost sleep on weekends. This does not work.  Try to get about 30 minutes of exercise on most days. Do not exercise 2-3 hours before going to bed.  Limit naps during the day to 30 minutes or less.  Do not use any products that contain nicotine or tobacco, such as cigarettes or e-cigarettes. If you need help quitting, ask your health care provider.  Do not drink caffeinated beverages for at least 8 hours before going to bed. Coffee, tea, and some sodas contain caffeine.  Do not drink alcohol close to bedtime.  Do not eat large meals close to bedtime.  Do not take naps in the late afternoon.  Try to get at least 30 minutes of sunlight every day. Morning sunlight is best.  Make time to relax before bed. Reading, listening to music, or taking a hot bath promotes quality sleep.  Make your bedroom a place that promotes quality sleep. Keep your bedroom dark, quiet, and at a comfortable room temperature. Make sure your bed is comfortable. Take out sleep distractions like TV, a computer, smartphone, and bright lights.  If you are lying awake in bed for longer than 20 minutes, get  up and do a relaxing activity until you feel sleepy.  Work with your health care provider to treat medical conditions that may affect sleeping, such as: ? Nasal obstruction. ? Snoring. ? Sleep apnea and other sleep disorders.  Talk to your health care provider if you think any of your prescription medicines may cause you to have difficulty falling or staying asleep.  If you have sleep problems, talk with a sleep  consultant. If you think you have a sleep disorder, talk with your health care provider about getting evaluated by a specialist.      Where to find more information  National Sleep Foundation website: https://sleepfoundation.org  National Heart, Lung, and Blood Institute (NHLBI): https://hall.info/.pdf  Centers for Disease Control and Prevention (CDC): DetailSports.is Contact a health care provider if you:  Have trouble getting to sleep or staying asleep.  Often wake up very early in the morning and cannot get back to sleep.  Have daytime sleepiness.  Have daytime sleep attacks of suddenly falling asleep and sudden muscle weakness (narcolepsy).  Have a tingling sensation in your legs with a strong urge to move your legs (restless legs syndrome).  Stop breathing briefly during sleep (sleep apnea).  Think you have a sleep disorder or are taking a medicine that is affecting your quality of sleep. Summary  Most adults need 7-8 hours of quality sleep each night.  Getting enough quality sleep is an important part of health and well-being.  Make your bedroom a place that promotes quality sleep and avoid things that may cause you to have poor sleep, such as alcohol, caffeine, smoking, and large meals.  Talk to your health care provider if you have trouble falling asleep or staying asleep. This information is not intended to replace advice given to you by your health care provider. Make sure you discuss any questions you have with your health care provider. Document Revised: 01/17/2018 Document Reviewed: 01/17/2018 Elsevier Patient Education  2021 Elsevier Inc.    Insomnia Insomnia is a sleep disorder that makes it difficult to fall asleep or stay asleep. Insomnia can cause fatigue, low energy, difficulty concentrating, mood swings, and poor performance at work or school. There are three different ways to classify  insomnia:  Difficulty falling asleep.  Difficulty staying asleep.  Waking up too early in the morning. Any type of insomnia can be long-term (chronic) or short-term (acute). Both are common. Short-term insomnia usually lasts for three months or less. Chronic insomnia occurs at least three times a week for longer than three months. What are the causes? Insomnia may be caused by another condition, situation, or substance, such as:  Anxiety.  Certain medicines.  Gastroesophageal reflux disease (GERD) or other gastrointestinal conditions.  Asthma or other breathing conditions.  Restless legs syndrome, sleep apnea, or other sleep disorders.  Chronic pain.  Menopause.  Stroke.  Abuse of alcohol, tobacco, or illegal drugs.  Mental health conditions, such as depression.  Caffeine.  Neurological disorders, such as Alzheimer's disease.  An overactive thyroid (hyperthyroidism). Sometimes, the cause of insomnia may not be known. What increases the risk? Risk factors for insomnia include:  Gender. Women are affected more often than men.  Age. Insomnia is more common as you get older.  Stress.  Lack of exercise.  Irregular work schedule or working night shifts.  Traveling between different time zones.  Certain medical and mental health conditions. What are the signs or symptoms? If you have insomnia, the main symptom is having trouble falling asleep or  having trouble staying asleep. This may lead to other symptoms, such as:  Feeling fatigued or having low energy.  Feeling nervous about going to sleep.  Not feeling rested in the morning.  Having trouble concentrating.  Feeling irritable, anxious, or depressed. How is this diagnosed? This condition may be diagnosed based on:  Your symptoms and medical history. Your health care provider may ask about: ? Your sleep habits. ? Any medical conditions you have. ? Your mental health.  A physical exam. How is this  treated? Treatment for insomnia depends on the cause. Treatment may focus on treating an underlying condition that is causing insomnia. Treatment may also include:  Medicines to help you sleep.  Counseling or therapy.  Lifestyle adjustments to help you sleep better. Follow these instructions at home: Eating and drinking  Limit or avoid alcohol, caffeinated beverages, and cigarettes, especially close to bedtime. These can disrupt your sleep.  Do not eat a large meal or eat spicy foods right before bedtime. This can lead to digestive discomfort that can make it hard for you to sleep.   Sleep habits  Keep a sleep diary to help you and your health care provider figure out what could be causing your insomnia. Write down: ? When you sleep. ? When you wake up during the night. ? How well you sleep. ? How rested you feel the next day. ? Any side effects of medicines you are taking. ? What you eat and drink.  Make your bedroom a dark, comfortable place where it is easy to fall asleep. ? Put up shades or blackout curtains to block light from outside. ? Use a white noise machine to block noise. ? Keep the temperature cool.  Limit screen use before bedtime. This includes: ? Watching TV. ? Using your smartphone, tablet, or computer.  Stick to a routine that includes going to bed and waking up at the same times every day and night. This can help you fall asleep faster. Consider making a quiet activity, such as reading, part of your nighttime routine.  Try to avoid taking naps during the day so that you sleep better at night.  Get out of bed if you are still awake after 15 minutes of trying to sleep. Keep the lights down, but try reading or doing a quiet activity. When you feel sleepy, go back to bed.   General instructions  Take over-the-counter and prescription medicines only as told by your health care provider.  Exercise regularly, as told by your health care provider. Avoid exercise  starting several hours before bedtime.  Use relaxation techniques to manage stress. Ask your health care provider to suggest some techniques that may work well for you. These may include: ? Breathing exercises. ? Routines to release muscle tension. ? Visualizing peaceful scenes.  Make sure that you drive carefully. Avoid driving if you feel very sleepy.  Keep all follow-up visits as told by your health care provider. This is important. Contact a health care provider if:  You are tired throughout the day.  You have trouble in your daily routine due to sleepiness.  You continue to have sleep problems, or your sleep problems get worse. Get help right away if:  You have serious thoughts about hurting yourself or someone else. If you ever feel like you may hurt yourself or others, or have thoughts about taking your own life, get help right away. You can go to your nearest emergency department or call:  Your local emergency services (  911 in the [place]  A suicide crisis helpline, such as the National Suicide Prevention Lifeline at (815) 158-18121-573 526 6510. This is open 24 hours a day. Summary  Insomnia is a sleep disorder that makes it difficult to fall asleep or stay asleep.  Insomnia can be long-term (chronic) or short-term (acute).  Treatment for insomnia depends on the cause. Treatment may focus on treating an underlying condition that is causing insomnia.  Keep a sleep diary to help you and your health care provider figure out what could be causing your insomnia. This information is not intended to replace advice given to you by your health care provider. Make sure you discuss any questions you have with your health care provider. Document Revised: 08/20/2020 Document Reviewed: 08/20/2020 Elsevier Patient Education  2021 [employer]

## 2020-11-12 ENCOUNTER — Ambulatory Visit (INDEPENDENT_AMBULATORY_CARE_PROVIDER_SITE_OTHER): Payer: 59 | Admitting: Family Medicine

## 2020-11-12 ENCOUNTER — Encounter: Payer: Self-pay | Admitting: Family Medicine

## 2020-11-12 VITALS — BP 138/78 | HR 87 | Ht 67.0 in | Wt 219.2 lb

## 2020-11-12 DIAGNOSIS — G4700 Insomnia, unspecified: Secondary | ICD-10-CM | POA: Diagnosis not present

## 2020-11-12 DIAGNOSIS — Z9989 Dependence on other enabling machines and devices: Secondary | ICD-10-CM

## 2020-11-12 DIAGNOSIS — G4733 Obstructive sleep apnea (adult) (pediatric): Secondary | ICD-10-CM | POA: Diagnosis not present

## 2020-12-02 ENCOUNTER — Telehealth: Payer: Self-pay | Admitting: Family Medicine

## 2020-12-02 NOTE — Telephone Encounter (Signed)
Thank you for helping her, Annabelle Harman!

## 2020-12-02 NOTE — Telephone Encounter (Signed)
Hi Christy Holland I have Talked to Patient Having to send her referral to another place . Gave her some options she is going to call me back to schedule.    For [place] Area:  [facility] [place] [address]  [phone]  [facility] [address]  In [place]  [phone]  [facility], [address], [place], [place] [address] [phone]  [facility] [address] [place], [place] [address] [phone]  [facility]  [address] [place], [place] [address]  We Treat a Wide Range of Conditions Including "           ADHD, Anxiety, Bipolar, Depression, Eating Disorders, Family Therapy & Counseling, Grief, OCD, PTSD/Trauma, Postpartum Depression, Relationship/Marital Counseling, Substance Use  [facility] [address] [place], [place], [place] [address] Phone: [phone]  [facility] [address] [place], [place] [address] [phone]               . Type Date User Summary Attachment  Specialty Comments 11/25/2020 10:53 AM Elvina Mattes, CMA - -  Note   For [facility] Area:  [facility] [place] [address]  [phone]  [facility] [address]  In [place]  [phone]  [facility] Place at [facility], [address], [place], [place] [address] [phone]  [facility] [address] [place], [place] [address] [phone]  [facility]  [address] [place], [place] [address]  We Treat a Wide Range of Conditions Including   ADHD, Anxiety, Bipolar, Depression, Eating Disorders, Family Therapy & Counseling, Grief, OCD, PTSD/Trauma, Postpartum Depression,  Relationship/Marital Counseling, Substance Use  [facility]  [address] [place], [place], [place] [address] [phone]  [facility] [address] [place], [place] [address] [phone]             . Type Date User Summary Attachment  General 11/25/2020 10:53 AM Elvina Mattes, CMA Auto: Referral message -  Note   ----- Message ----- From: Elvina Mattes, CMA Sent: 11/25/2020  10:53 AM EST To: Shawnie Dapper, NP Subject: referral                                       For [facility] Area:  [facility] [place] [address]  [phone]  [facility] [address]  In [place]  [phone]  [facility] at [facility], [address], [place], [place] [address] [phone]  [facility] [address] [place], [place] [address] [phone]  [facility]  [address] [place], [place] [address]  We Treat a Wide Range of Conditions Including "           ADHD, Anxiety, Bipolar, Depression, Eating Disorders, Family Therapy & Counseling, Grief, OCD, PTSD/Trauma, Postpartum Depression, Relationship/Marital Counseling, Substance Use  [facility] [address] [place], [place], [place] [address] Phone: [phone]  [facility] [address] [place], [place] [address] [phone]

## 2020-12-10 NOTE — Telephone Encounter (Signed)
Called patient because I had not hurd from Here. I could not leave Patient a message because her voice mail is not set up .   Patient not on My- Chart .

## 2020-12-14 NOTE — Telephone Encounter (Signed)
Amy I had Not Heard from the patient yet .  Patient has  been denied x 2 by Psy. . Going to get her referral sent to [facility] . Telephone [phone]. I called patient and left her a detailed message and gave her the telephone number . Thanks Annabelle Harman

## 2021-02-10 NOTE — Patient Instructions (Addendum)
Below is our plan:  We will continue current treatment plan.   Please make sure you are staying well hydrated. I recommend 50-60 ounces daily. Well balanced diet and regular exercise encouraged. Consistent sleep schedule with 6-8 hours recommended.   Please continue follow up with care team as directed.   Follow up with me in 1 year   You may receive a survey regarding today's visit. I encourage you to leave honest feed back as I do use this information to improve patient care. Thank you for seeing me today!    Please continue using your CPAP regularly. While your insurance requires that you use CPAP at least 4 hours each night on 70% of the nights, I recommend, that you not skip any nights and use it throughout the night if you can. Getting used to CPAP and staying with the treatment long term does take time and patience and discipline. Untreated obstructive sleep apnea when it is moderate to severe can have an adverse impact on cardiovascular health and raise her risk for heart disease, arrhythmias, hypertension, congestive heart failure, stroke and diabetes. Untreated obstructive sleep apnea causes sleep disruption, nonrestorative sleep, and sleep deprivation. This can have an impact on your day to day functioning and cause daytime sleepiness and impairment of cognitive function, memory loss, mood disturbance, and problems focussing. Using CPAP regularly can improve these symptoms.  For [place]:  [facility]  [address]  [phone]  [facility] [address]  [place]  [phone]  [facility], [address], [place], [place] [address] [phone]  [facility] [address] [place], [place] [address] [phone]  [facility]  [address] [place], [place] [address]  We Treat a Wide Range of  Conditions Including "           ADHD, Anxiety, Bipolar, Depression, Eating Disorders, Family Therapy & Counseling, Grief, OCD, PTSD/Trauma, Postpartum Depression, Relationship/Marital Counseling, Substance Use  [facility] [address], [place], [place] [address] Phone: [phone]  [facility] [address] [place], [place] [address] [phone]    Quality Sleep Information, Adult Quality sleep is important for your mental and physical health. It also improves your quality of life. Quality sleep means you:  Are asleep for most of the time you are in bed.  Fall asleep within 30 minutes.  Wake up no more than once a night.  Are awake for no longer than 20 minutes if you do wake up during the night. Most adults need 7-8 hours of quality sleep each night. How can poor sleep affect me? If you do not get enough quality sleep, you may have:  Mood swings.  Daytime sleepiness.  Confusion.  Decreased reaction time.  Sleep disorders, such as insomnia and sleep apnea.  Difficulty with: ? Solving problems. ? Coping with stress. ? Paying attention. These issues may affect your performance and productivity at work, school, and at home. Lack of sleep may also put you at higher risk for accidents, suicide, and risky behaviors. If you do not get quality sleep you may also be at higher risk for several health problems, including:  Infections.  Type 2 diabetes.  Heart disease.  High blood pressure.  Obesity.  Worsening of long-term conditions, like arthritis, kidney disease, depression, Parkinson's disease, and epilepsy. What actions can I take to  get more quality sleep?  Stick to a sleep schedule. Go to sleep and wake up at about the same time each day. Do not try to sleep less on weekdays and make up for lost sleep on weekends. This does not work.  Try to get about 30 minutes of  exercise on most days. Do not exercise 2-3 hours before going to bed.  Limit naps during the day to 30 minutes or less.  Do not use any products that contain nicotine or tobacco, such as cigarettes or e-cigarettes. If you need help quitting, ask your health care provider.  Do not drink caffeinated beverages for at least 8 hours before going to bed. Coffee, tea, and some sodas contain caffeine.  Do not drink alcohol close to bedtime.  Do not eat large meals close to bedtime.  Do not take naps in the late afternoon.  Try to get at least 30 minutes of sunlight every day. Morning sunlight is best.  Make time to relax before bed. Reading, listening to music, or taking a hot bath promotes quality sleep.  Make your bedroom a place that promotes quality sleep. Keep your bedroom dark, quiet, and at a comfortable room temperature. Make sure your bed is comfortable. Take out sleep distractions like TV, a computer, smartphone, and bright lights.  If you are lying awake in bed for longer than 20 minutes, get up and do a relaxing activity until you feel sleepy.  Work with your health care provider to treat medical conditions that may affect sleeping, such as: ? Nasal obstruction. ? Snoring. ? Sleep apnea and other sleep disorders.  Talk to your health care provider if you think any of your prescription medicines may cause you to have difficulty falling or staying asleep.  If you have sleep problems, talk with a sleep consultant. If you think you have a sleep disorder, talk with your health care provider about getting evaluated by a specialist.      Where to find more information  National Sleep Foundation website: https://sleepfoundation.org  National Heart, Lung, and Blood Institute (NHLBI): https://hall.info/.pdf  Centers for Disease Control and Prevention (CDC): DetailSports.is Contact a health care provider if you:  Have trouble getting  to sleep or staying asleep.  Often wake up very early in the morning and cannot get back to sleep.  Have daytime sleepiness.  Have daytime sleep attacks of suddenly falling asleep and sudden muscle weakness (narcolepsy).  Have a tingling sensation in your legs with a strong urge to move your legs (restless legs syndrome).  Stop breathing briefly during sleep (sleep apnea).  Think you have a sleep disorder or are taking a medicine that is affecting your quality of sleep. Summary  Most adults need 7-8 hours of quality sleep each night.  Getting enough quality sleep is an important part of health and well-being.  Make your bedroom a place that promotes quality sleep and avoid things that may cause you to have poor sleep, such as alcohol, caffeine, smoking, and large meals.  Talk to your health care provider if you have trouble falling asleep or staying asleep. This information is not intended to replace advice given to you by your health care provider. Make sure you discuss any questions you have with your health care provider. Document Revised: 01/17/2018 Document Reviewed: 01/17/2018 Elsevier Patient Education  2021 [employer]vier Inc.   Sleep Apnea Sleep apnea affects breathing during sleep. It causes breathing to stop for a short time or to become shallow. It can  also increase the risk of:  Heart attack.  Stroke.  Being very overweight (obese).  Diabetes.  Heart failure.  Irregular heartbeat. The goal of treatment is to help you breathe normally again. What are the causes? There are three kinds of sleep apnea:  Obstructive sleep apnea. This is caused by a blocked or collapsed airway.  Central sleep apnea. This happens when the brain does not send the right signals to the muscles that control breathing.  Mixed sleep apnea. This is a combination of obstructive and central sleep apnea. The most common cause of this condition is a collapsed or blocked airway. This can happen  if:  Your throat muscles are too relaxed.  Your tongue and tonsils are too large.  You are overweight.  Your airway is too small.   What increases the risk?  Being overweight.  Smoking.  Having a small airway.  Being older.  Being female.  Drinking alcohol.  Taking medicines to calm yourself (sedatives or tranquilizers).  Having family members with the condition. What are the signs or symptoms?  Trouble staying asleep.  Being sleepy or tired during the day.  Getting angry a lot.  Loud snoring.  Headaches in the morning.  Not being able to focus your mind (concentrate).  Forgetting things.  Less interest in sex.  Mood swings.  Personality changes.  Feelings of sadness (depression).  Waking up a lot during the night to pee (urinate).  Dry mouth.  Sore throat. How is this diagnosed?  Your medical history.  A physical exam.  A test that is done when you are sleeping (sleep study). The test is most often done in a sleep lab but may also be done at home. How is this treated?  Sleeping on your side.  Using a medicine to get rid of mucus in your nose (decongestant).  Avoiding the use of alcohol, medicines to help you relax, or certain pain medicines (narcotics).  Losing weight, if needed.  Changing your diet.  Not smoking.  Using a machine to open your airway while you sleep, such as: ? An oral appliance. This is a mouthpiece that shifts your lower jaw forward. ? A CPAP device. This device blows air through a mask when you breathe out (exhale). ? An EPAP device. This has valves that you put in each nostril. ? A BPAP device. This device blows air through a mask when you breathe in (inhale) and breathe out.  Having surgery if other treatments do not work. It is important to get treatment for sleep apnea. Without treatment, it can lead to:  High blood pressure.  Coronary artery disease.  In men, not being able to have an erection  (impotence).  Reduced thinking ability.   Follow these instructions at home: Lifestyle  Make changes that your doctor recommends.  Eat a healthy diet.  Lose weight if needed.  Avoid alcohol, medicines to help you relax, and some pain medicines.  Do not use any products that contain nicotine or tobacco, such as cigarettes, e-cigarettes, and chewing tobacco. If you need help quitting, ask your doctor. General instructions  Take over-the-counter and prescription medicines only as told by your doctor.  If you were given a machine to use while you sleep, use it only as told by your doctor.  If you are having surgery, make sure to tell your doctor you have sleep apnea. You may need to bring your device with you.  Keep all follow-up visits as told by your doctor. This  is important. Contact a doctor if:  The machine that you were given to use during sleep bothers you or does not seem to be working.  You do not get better.  You get worse. Get help right away if:  Your chest hurts.  You have trouble breathing in enough air.  You have an uncomfortable feeling in your back, arms, or stomach.  You have trouble talking.  One side of your body feels weak.  A part of your face is hanging down. These symptoms may be an emergency. Do not wait to see if the symptoms will go away. Get medical help right away. Call your local emergency services (911 in the [place] Do not drive yourself to the hospital. Summary  This condition affects breathing during sleep.  The most common cause is a collapsed or blocked airway.  The goal of treatment is to help you breathe normally while you sleep. This information is not intended to replace advice given to you by your health care provider. Make sure you discuss any questions you have with your health care provider. Document Revised: 07/27/2018 Document Reviewed: 06/05/2018 Elsevier Patient Education  2021 [employer].

## 2021-02-10 NOTE — Progress Notes (Addendum)
PATIENT: Christy Holland DOB: July 22, 1971  REASON FOR VISIT: follow up HISTORY FROM: patient  Chief Complaint  Patient presents with  . Follow-up    RM 1 alone Pt is well, still has issues staying asleep even with CPAP on.      HISTORY OF PRESENT ILLNESS: 02/11/21 ALL: Sinia returns for follow up for OSA on CPAP and insomnia. She continues fairly regular use of CPAP therapy. She also continues trazodone 150mg  daily. She continued to have difficulty sleeping and was encouraged to establish care with PCP and to consider psychiatry referral for management of anxiety. She has not yet scheduled an appt with PCP. She tried to get an appt with psychiatry but was having a hard time finding someone to take her insurance. She feels that she is doing ok. Still having difficulty with sleep. No concerns with CPAP. Trazodone is helping.   Compliance report dated 01/11/2021-02/10/2021 revelas that she used CPAP 25/30 days. 4 hour compliance was 77.4%. Residual AHI was 2.4/hr on 5-11cmH20. No significant leak noted.    11/12/2020 ALL:  Tristen returns for follow up on OSA and insomnia. We increased trazodone to 150mg  at bedtime in 07/2020. It helps to get her to sleep but continuing to have trouble staying asleep. She usually has to use the bathroom and then has difficulty getting back to sleep. She gets 3-4 ours each night. She feels that her mind starts working and she can't return to sleep. She continues to care for her mother who is ill. She travels to [facility] often to take care of her. Sometimes she does not take her CPAP machine with her. She denies any concerns with CPAP machine or supplies. No difficulty with tolerability of therapy.   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 Cardiology workup showed 30% PVC burden but no other significant findings. She was started on flecainide and metoprolol but discontinued these medications several months ago. She is uncertain why, She  does not have PCP. Last labs in 2018.   Compliance report dated 10/03/2020 through 11/03/2020 reveals that she used CPAP 20 one of the past 30 days for compliance of 65.6%.  She used CPAP greater than 4 hours 59.4%.  Residual AHI was 2.7 on 5 to 11 cm of water.  There was no significant leak noted.   08/10/2020 ALL: [name] is a 50 y.o. female here today for follow up for OSA on CPAP. She has continues intermittent use of CPAP therapy. She continues trazodone 100mg  for insomnia. She feels that she is able to go to sleep but unable to stay asleep. She usually has to use the restroom and is unable to get back to sleep. She usually takes trazodone around 9:30, goes to bed around 10pm. Sometimes she may fall asleep watching TV, sometimes not. She usually wakes around 2am and is awake for about an hour.  Sometimes she may drift back off to sleep around 3 AM and then wakes at 5:30 AM.  She does not feel anxious or depressed throughout the day.  She admits to inconsistent sleep patterns as her mother is ill.  She has been traveling to [facility] to help take care of her.  She has not been using CPAP when traveling.  Compliance report dated 07/06/2020 through 08/04/2020 reveals that she used CPAP 19 of the past 30 days for compliance of 63%.  She is CPAP greater than 4 hours 53% of the time.  Residual AHI was 2.6 on  5 to 11 cm of water.  No significant leak noted.   HISTORY: (copied from my note on 02/05/2020)  Christy Holland is a 50 y.o. female here today for follow up.  She is doing well with CPAP therapy.  She is using her machine every night.  She continues to have difficulty with insomnia.  We started her on trazodone 50 mg at bedtime.  This has been helpful in helping her get to sleep but she continues to have trouble staying asleep.  She is interested in increasing the dose.  She has tried Benadryl and melatonin in the past with no relief.  She does not feel that anxiety or depression are contributing  but is followed closely by her primary care provider.  Compliance report dated 01/06/2020 through 02/04/2020 reveals that she used CPAP 30 of the past 30 days for compliance of 100%.  She used CPAP greater than 4 hours 73.3% of the time.  Average usage was 4 hours and 59 minutes.  Residual AHI was 3.8 on 5 to 11 cm of water pressure and an EPR of 3.  There was no significant leak noted.   HISTORY: (copied from my note on 11/07/2019)  Christy Atwateris a 50 y.o.femalehere today for follow up for OSA on CPAP.She reports that she is doing fairly well on CPAP therapy. She is working towards nightly use and meeting the 4-hour recommendation. She denies difficulty with CPAP machine. She does continue to note insomnia. She has tried over-the-counter options including melatonin and Benadryl with no success.  Compliance report dated 10/08/2019 through 11/06/2019 reveals that she used CPAP 28 of the last 30 days for compliance of 93.3%. She used CPAP greater than 4 hours 83% of time. Average usage was 5 hours and 27 minutes. Residual AHI was 3.8 on 5 to 11 cm of water and an EPR of 3. There was no significant leak noted.   REVIEW OF SYSTEMS: Out of a complete 14 system review of symptoms, the patient complains only of the following symptoms, insomnia and all other reviewed systems are negative.  ESS: 10   ALLERGIES: Allergies  Allergen Reactions  . Sulfa Antibiotics     HOME MEDICATIONS: Outpatient Medications Prior to Visit  Medication Sig Dispense Refill  . flecainide (TAMBOCOR) 100 MG tablet Take 1 tablet (100 mg total) by mouth 2 (two) times daily. 180 tablet 3  . metoprolol tartrate (LOPRESSOR) 50 MG tablet Take 1 tablet (50 mg total) by mouth 2 (two) times daily. 180 tablet 3  . Multiple Vitamin (MULTIVITAMIN) capsule Take 1 capsule by mouth daily.    . Vitamin D, Ergocalciferol, (DRISDOL) 1.25 MG (50000 UT) CAPS capsule Take 50,000 Units by mouth every 7 (seven) days.    .  traZODone (DESYREL) 100 MG tablet Take 1.5 tablets (150 mg total) by mouth at bedtime. 135 tablet 3   No facility-administered medications prior to visit.    PAST MEDICAL HISTORY: Past Medical History:  Diagnosis Date  . Chronic maxillary sinusitis 08/21/2017  . Frequent PVCs   . Holter monitor, abnormal 08/22/2019  . 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 Rhinitis, chronic 08/21/2017  . Sinusitis 02/13/2018  . Snoring 08/21/2017    PAST SURGICAL HISTORY: History reviewed. No pertinent surgical history.  FAMILY HISTORY: Family History  Problem Relation Age of Onset  . Hypertension Mother   . Hyperlipidemia Mother   . Diabetes Mother   . Hypertension Father   . Hyperlipidemia Father   . Diabetes Father   .  Heart attack Father 89  . Stroke Father     SOCIAL HISTORY: Social History   Socioeconomic History  . Marital status: Married    Spouse name: Not on file  . Number of children: 3  . Years of education: Not on file  . Highest education level: Not on file  Occupational History  . Not on file  Tobacco Use  . Smoking status: Never Smoker  . Smokeless tobacco: Never Used  Substance and Sexual Activity  . Alcohol use: No    Alcohol/week: 0.0 standard drinks  . Drug use: No  . Sexual activity: Yes    Birth control/protection: Condom  Other Topics Concern  . Not on file  Social History Narrative  . Not on file   Social Determinants of Health   Financial Resource Strain: Not on file  Food Insecurity: Not on file  Transportation Needs: Not on file  Physical Activity: Not on file  Stress: Not on file  Social Connections: Not on file  Intimate Partner Violence: Not on file     PHYSICAL EXAM  Vitals:   02/11/21 0825  BP: 108/74  Pulse: 75  Weight: 227 lb (103 kg)  Height: 5\' 7"  (1.702 m)   Body mass index is 35.55 kg/m.  Generalized: Well developed, in no acute distress  Cardiology: normal rate and rhythm, PVC's, no murmur  noted Respiratory: clear to auscultation bilaterally  Neurological examination  Mentation: Alert oriented to time, place, history taking. Follows all commands speech and language fluent Cranial nerve II-XII: Pupils were equal round reactive to light. Extraocular movements were full, visual field were full  Motor: The motor testing reveals 5 over 5 strength of all 4 extremities. Good symmetric motor tone is noted throughout.  Gait and station: Gait is normal.    DIAGNOSTIC DATA (LABS, IMAGING, TESTING) - I reviewed patient records, labs, notes, testing and imaging myself where available.  No flowsheet data found.   Lab Results  Component Value Date   WBC 5.8 02/13/2018   HGB 12.6 02/13/2018   HCT 38.6 02/13/2018   MCV 91.1 02/13/2018   PLT 204 07/31/2017      Component Value Date/Time   NA 137 07/31/2017 1712   K 4.5 07/31/2017 1712   CL 98 07/31/2017 1712   CO2 26 07/31/2017 1712   GLUCOSE 88 07/31/2017 1712   BUN 14 07/31/2017 1712   CREATININE 0.79 07/31/2017 1712   CALCIUM 9.3 07/31/2017 1712   PROT 6.9 07/31/2017 1712   ALBUMIN 4.2 07/31/2017 1712   AST 26 07/31/2017 1712   ALT 11 07/31/2017 1712   ALKPHOS 63 07/31/2017 1712   BILITOT <0.2 07/31/2017 1712   GFRNONAA 90 07/31/2017 1712   GFRAA 104 07/31/2017 1712   No results found for: CHOL, HDL, LDLCALC, LDLDIRECT, TRIG, CHOLHDL No results found for: 09/30/2017 No results found for: VITAMINB12 Lab Results  Component Value Date   TSH 1.450 07/31/2017     ASSESSMENT AND PLAN 50 y.o. year old female  has a past medical history of Chronic maxillary sinusitis (08/21/2017), Frequent PVCs, Holter monitor, abnormal (08/22/2019), Hypertrophy of tonsil and adenoid (08/21/2017), Rhinitis, chronic (08/21/2017), Sinusitis (02/13/2018), and Snoring (08/21/2017). here with     ICD-10-CM   1. OSA on CPAP  G47.33 For home use only DME continuous positive airway pressure (CPAP)   Z99.89   2. Insomnia, unspecified type  G47.00       Mahogony Gilchrest is doing much better with compliance. Current download shows optimal compliance  with daily and 4 hour use. We will update supply orders as indicated. Risks of untreated sleep apnea review and education materials provided.  She continues to struggle with insomnia.  She does feel trazodone is helping.  We have continued our discussion regarding the causes of insomnia.  I feel that inconsistent sleep patterns in combination with increased stress levels are contributing.  She does not have a primary care provider.  She does have a history of vitamin D deficiency as well as frequent PVCs.  She is no longer on beta-blocker or antiarrhythmic.  TSH was normal in 2018 but has not had labs since.  I have reiterated the importance of having a primary care provider to help check in on her routinely.  We will continue trazodone to 150 mg every night about 30 to 45 minutes before bedtime.  We have reviewed sleep hygiene and I have encouraged her to work on these at home. She was provided contact information for local psychiatry providers. Healthy lifestyle habits encouraged. She will follow up in 12 months, sooner if needed. She verbalizes understanding and agreement with this plan.    Orders Placed This Encounter  Procedures  . For home use only DME continuous positive airway pressure (CPAP)    Supplies    Order Specific Question:   Length of Need    Answer:   Lifetime    Order Specific Question:   Patient has OSA or probable OSA    Answer:   Yes    Order Specific Question:   Is the patient currently using CPAP in the home    Answer:   Yes    Order Specific Question:   Settings    Answer:   Other see comments    Order Specific Question:   CPAP supplies needed    Answer:   Mask, headgear, cushions, filters, heated tubing and water chamber     Meds ordered this encounter  Medications  . traZODone (DESYREL) 100 MG tablet    Sig: Take 1.5 tablets (150 mg total) by mouth at bedtime.     Dispense:  135 tablet    Refill:  3    Order Specific Question:   Supervising Provider    Answer:   Anson FretAHERN, ANTONIA B [id number]]      I spent 20 minutes with the patient. 50% of this time was spent counseling and educating patient on plan of care and medications.    Shawnie Dappermy Amorita Vanrossum, FNP-C 02/11/2021, 8:47 AM [facility] [address], [address] [place], [place] [address] [phone]  I reviewed the above note and documentation by the Nurse Practitioner and agree with the history, exam, assessment and plan as outlined above. I was available for consultation. Huston FoleySaima Athar, MD, PhD [facility])

## 2021-02-11 ENCOUNTER — Ambulatory Visit (INDEPENDENT_AMBULATORY_CARE_PROVIDER_SITE_OTHER): Payer: 59 | Admitting: Family Medicine

## 2021-02-11 ENCOUNTER — Encounter: Payer: Self-pay | Admitting: Family Medicine

## 2021-02-11 VITALS — BP 108/74 | HR 75 | Ht 67.0 in | Wt 227.0 lb

## 2021-02-11 DIAGNOSIS — G4733 Obstructive sleep apnea (adult) (pediatric): Secondary | ICD-10-CM

## 2021-02-11 DIAGNOSIS — Z9989 Dependence on other enabling machines and devices: Secondary | ICD-10-CM

## 2021-02-11 DIAGNOSIS — G4700 Insomnia, unspecified: Secondary | ICD-10-CM

## 2021-02-11 MED ORDER — TRAZODONE HCL 100 MG PO TABS: 150.0000 mg | ORAL_TABLET | Freq: Every day | ORAL | 3 refills | Status: DC

## 2021-02-15 ENCOUNTER — Other Ambulatory Visit: Payer: Self-pay | Admitting: Family Medicine

## 2021-02-15 ENCOUNTER — Telehealth: Payer: Self-pay | Admitting: Family Medicine

## 2021-02-15 DIAGNOSIS — F418 Other specified anxiety disorders: Secondary | ICD-10-CM

## 2021-02-15 NOTE — Telephone Encounter (Signed)
Amy will place another referral for Psychiatry please so I can get patient sent to [facility] health in [place] 336 [phone] . Thank you .

## 2021-02-15 NOTE — Telephone Encounter (Signed)
Yes Amy I can Sorry for the delay I was out of the office . I will get her sent .  There Telephone number is [phone] . And I tried to call the PT and could Leave her a voice mail I will contact [place] and trying PT again . Thanks Annabelle Harman .

## 2021-02-15 NOTE — Telephone Encounter (Signed)
Welcome . Correction On Telephone . [phone]  [facility]  PSY . .  I have sent referral and told them to please let use know if they can't get in touch with PT . Try to call PT back again. Voice mail Not set up . Thanks Annabelle Harman

## 2021-03-09 ENCOUNTER — Encounter: Payer: Self-pay | Admitting: Family Medicine

## 2021-04-01 ENCOUNTER — Other Ambulatory Visit: Payer: Self-pay

## 2021-04-01 ENCOUNTER — Ambulatory Visit (INDEPENDENT_AMBULATORY_CARE_PROVIDER_SITE_OTHER): Payer: 59

## 2021-04-01 ENCOUNTER — Ambulatory Visit (INDEPENDENT_AMBULATORY_CARE_PROVIDER_SITE_OTHER): Payer: 59 | Admitting: Internal Medicine

## 2021-04-01 ENCOUNTER — Encounter: Payer: Self-pay | Admitting: Internal Medicine

## 2021-04-01 VITALS — BP 128/60 | HR 52 | Ht 67.0 in | Wt 231.0 lb

## 2021-04-01 DIAGNOSIS — R0600 Dyspnea, unspecified: Secondary | ICD-10-CM | POA: Diagnosis not present

## 2021-04-01 DIAGNOSIS — R0609 Other forms of dyspnea: Secondary | ICD-10-CM

## 2021-04-01 DIAGNOSIS — R0683 Snoring: Secondary | ICD-10-CM

## 2021-04-01 MED ORDER — CLONAZEPAM 0.5 MG PO TABS: ORAL_TABLET | ORAL | 0 refills | Status: DC

## 2021-04-01 NOTE — Patient Instructions (Signed)
Order- DME [facility]/ [facility]  please send current download/ add Airview  Order- lab- alpha 1 antitrypsin assay for phenotype and level    dx Dyspnea on exertion  Order- CXR       dx  Dyspnea on exertion

## 2021-04-01 NOTE — Progress Notes (Signed)
04/01/21- 49 yoF never smoker for sleep evaluation- self referred Sleep issues, uses CPAP/ [facility] Medical problem list includes  PVCs, Chronic Rhinitis, Chronic Maxillary Sinusitis, Tonsil Hypertrophy,  HST ([facility]) 05/08/2019- AHI 6.6/ hr, body weight 218 lbs Epworth score-10 Body Weight today- 231 lbs Loud snoring. Complains of difficulty initiating and maintaining sleep. CPAP does help feel better with les daytime fatigue and headache. Tried melatonin and trazodone. [facility] won't give anything else. Naps not much help. No caffeine. Denies ENT surgery, heart, lung or thyroid problems. Denies complex parasomnias. Also concerned about dyspnea on exertion and says family history of COPD but nonsmokers.  No cough or wheeze.  Prior to Admission medications   Medication Sig Start Date End Date Taking? Authorizing Provider  clonazePAM (KLONOPIN) 0.5 MG tablet 1 or 2 tabs for sleep as needed 04/01/21  Yes Ovella Manygoats D, MD  gabapentin (NEURONTIN) 100 MG capsule Take 100 mg by mouth as directed.   Yes [provider]  Multiple Vitamin (MULTIVITAMIN) capsule Take 1 capsule by mouth daily.   Yes [provider]  Vitamin D, Ergocalciferol, (DRISDOL) 1.25 MG (50000 UT) CAPS capsule Take 50,000 Units by mouth every 7 (seven) days. 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 Family History  Problem Relation Age of Onset   Hypertension Mother    Hyperlipidemia Mother    Diabetes Mother    Hypertension Father    Hyperlipidemia Father    Diabetes Father    Heart attack Father 62   Stroke Father    Social History   Socioeconomic History   Marital status: Married    Spouse name: Not on file    Number of children: 3   Years of education: Not on file   Highest education level: Not on file  Occupational History   Not on file  Tobacco Use   Smoking status: Never   Smokeless tobacco: Never  Substance and Sexual Activity   Alcohol use: No    Alcohol/week: 0.0 standard drinks   Drug use: No   Sexual activity: Yes    Birth control/protection: Condom  Other Topics Concern   Not on file  Social History Narrative   ** Merged History Encounter **       Social Determinants of Health   Financial Resource Strain: Not on file  Food Insecurity: Not on file  Transportation Needs: Not on file  Physical Activity: Not on file  Stress: Not on file  Social Connections: Not on file  Intimate Partner Violence: Not on file    ROS-see HPI   + = positive Constitutional:    weight loss, night sweats, fevers, chills, f+atigue, lassitude. HEENT:    +headaches, difficulty swallowing, tooth/dental problems, sore throat,       sneezing, itching, ear ache, nasal congestion, post nasal drip, snoring CV:    chest pain, orthopnea, PND, swelling in lower extremities, anasarca,                                   dizziness, palpitations Resp:   shortness of breath with exertion or at rest.  productive cough,   non-productive cough, coughing up of blood.              change in color of mucus.  wheezing.   Skin:    rash or lesions. GI:  No-   heartburn, indigestion, abdominal pain, nausea, vomiting, diarrhea,                 change in bowel habits, loss of appetite GU: dysuria, change in color of urine, no urgency or frequency.   flank pain. MS:   joint pain, stiffness, decreased range of motion, back pain. Neuro-     nothing unusual Psych:  change in mood or affect.  depression or anxiety.   memory loss.  OBJ- Physical Exam General- Alert, Oriented, Affect-appropriate, Distress- none acute, + obese Skin- rash-none, lesions- none, excoriation- none Lymphadenopathy- none Head-  atraumatic            Eyes- Gross vision intact, PERRLA, conjunctivae and secretions clear            Ears- Hearing, canals-normal            Nose- Clear, no-Septal dev, mucus, polyps, erosion, perforation             Throat- Mallampati II , mucosa clear , drainage- none, tonsils- atrophic, + teeth Neck- flexible , trachea midline, no stridor , thyroid nl, carotid no bruit Chest - symmetrical excursion , unlabored           Heart/CV- RRR , no murmur , no gallop  , no rub, nl s1 s2                           - JVD- none , edema- none, stasis changes- none, varices- none           Lung- clear to P&A, wheeze- none, cough- none , dullness-none, rub- none           Chest wall-  Abd-  Br/ Gen/ Rectal- Not done, not indicated Extrem- cyanosis- none, clubbing, none, atrophy- none, strength- nl Neuro- grossly intact to observation

## 2021-04-03 ENCOUNTER — Encounter: Payer: Self-pay | Admitting: Internal Medicine

## 2021-04-03 DIAGNOSIS — R0600 Dyspnea, unspecified: Secondary | ICD-10-CM | POA: Insufficient documentation

## 2021-04-03 DIAGNOSIS — R0609 Other forms of dyspnea: Secondary | ICD-10-CM | POA: Insufficient documentation

## 2021-04-03 NOTE — Assessment & Plan Note (Signed)
Not sure there is more here than deconditioning and obesity. Plan- CXR, a1AT assay, encourage walking

## 2021-04-03 NOTE — Assessment & Plan Note (Signed)
Using CPAP and reports feeling better with it. Plan- contact her DME for download and settings.

## 2021-04-06 ENCOUNTER — Encounter: Payer: Self-pay | Admitting: Internal Medicine

## 2021-04-14 ENCOUNTER — Encounter: Payer: Self-pay | Admitting: Student

## 2021-04-14 ENCOUNTER — Other Ambulatory Visit: Payer: Self-pay

## 2021-04-14 ENCOUNTER — Ambulatory Visit (INDEPENDENT_AMBULATORY_CARE_PROVIDER_SITE_OTHER): Payer: 59 | Admitting: Student

## 2021-04-14 VITALS — BP 120/68 | HR 74 | Ht 67.0 in | Wt 225.0 lb

## 2021-04-14 DIAGNOSIS — I493 Ventricular premature depolarization: Secondary | ICD-10-CM

## 2021-04-14 NOTE — Patient Instructions (Signed)
Medication Instructions:  Your physician recommends that you continue on your current medications as directed. Please refer to the Current Medication list given to you today.  *If you need a refill on your cardiac medications before your next appointment, please call your pharmacy*   Lab Work: None If you have labs (blood work) drawn today and your tests are completely normal, you will receive your results only by: MyChart Message (if you have MyChart) OR A paper copy in the mail If you have any lab test that is abnormal or we need to change your treatment, we will call you to review the results.   Follow-Up: At [facility], you and your health needs are our priority.  As part of our continuing mission to provide you with exceptional heart care, we have created designated Provider Care Teams.  These Care Teams include your primary Cardiologist (physician) and Advanced Practice Providers (APPs -  Physician Assistants and Nurse Practitioners) who all work together to provide you with the care you need, when you need it.  We recommend signing up for the patient portal called "MyChart".  Sign up information is provided on this After Visit Summary.  MyChart is used to connect with patients for Virtual Visits (Telemedicine).  Patients are able to view lab/test results, encounter notes, upcoming appointments, etc.  Non-urgent messages can be sent to your provider as well.   To learn more about what you can do with MyChart, go to [email or web address].    Your next appointment:   As scheduled 

## 2021-04-14 NOTE — Progress Notes (Signed)
PCP:  Patient, No Pcp Per (Inactive) Primary Cardiologist: None Electrophysiologist: Lewayne Bunting, MD   Christy Holland is a 50 y.o. female seen today for Lewayne Bunting, MD for routine electrophysiology followup.    Since last being seen in our clinic the patient reports increasing palpitations. Her flecainide and toprol prescriptions expired, and she knew she wanted to discuss ablation so did not call to have them re-ordered. She has noticed increased fatigue over the past few months, but no SOB. She previously didn't feel the PVCs often, but has felt more frequent "fluttering" in her chest. she denies chest pain, dyspnea, PND, orthopnea, nausea, vomiting, dizziness, syncope, edema, weight gain, or early satiety.  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  Current Outpatient Medications  Medication Sig Dispense Refill   clonazePAM (KLONOPIN) 0.5 MG tablet 1 or 2 tabs for sleep as needed 30 tablet 0   gabapentin (NEURONTIN) 300 MG capsule Take by mouth daily.     Multiple Vitamin (MULTIVITAMIN) capsule Take 1 capsule by mouth daily.     Vitamin D, Ergocalciferol, (DRISDOL) 1.25 MG (50000 UT) CAPS capsule Take 50,000 Units by mouth every 7 (seven) days.     No current facility-administered medications for this visit.    Allergies  Allergen Reactions   Sulfasalazine Hives   Sulfa Antibiotics     Social History   Socioeconomic History   Marital status: Married    Spouse name: Not on file   Number of children: 3   Years of education: Not on file   Highest education level: Not on file  Occupational History   Not on file  Tobacco Use   Smoking status: Never   Smokeless tobacco: Never  Substance and Sexual Activity   Alcohol  use: No    Alcohol/week: 0.0 standard drinks   Drug use: No   Sexual activity: Yes    Birth control/protection: Condom  Other Topics Concern   Not on file  Social History Narrative   ** Merged History Encounter **       Social Determinants of Health   Financial Resource Strain: Not on file  Food Insecurity: Not on file  Transportation Needs: Not on file  Physical Activity: Not on file  Stress: Not on file  Social Connections: Not on file  Intimate Partner Violence: Not on file     Review of Systems: General: No chills, fever, night sweats or weight changes  Cardiovascular:  No chest pain, dyspnea on exertion, edema, orthopnea, palpitations, paroxysmal nocturnal dyspnea Dermatological: No rash, lesions or masses Respiratory: No cough, dyspnea Urologic: No hematuria, dysuria Abdominal: No nausea, vomiting, diarrhea, bright red blood per rectum, melena, or hematemesis Neurologic: No visual changes, weakness, changes in mental status All other systems reviewed and are otherwise negative except as noted above.  Physical Exam: Vitals:   04/14/21 0843  BP: 120/68  Pulse: 74  SpO2: 98%  Weight: 225 lb (102.1 kg)  Height: 5\' 7"  (1.702 m)    GEN- The patient is well appearing, alert and oriented x 3 today.   HEENT: normocephalic, atraumatic; sclera clear, conjunctiva pink; hearing intact; oropharynx clear; neck supple, no JVP Lymph- no cervical lymphadenopathy Lungs- Clear to ausculation bilaterally, normal work of breathing.  No  wheezes, rales, rhonchi Heart- Regular rate and rhythm, no murmurs, rubs or gallops, PMI not laterally displaced GI- soft, non-tender, non-distended, bowel sounds present, no hepatosplenomegaly Extremities- no clubbing, cyanosis, or edema; DP/PT/radial pulses 2+ bilaterally MS- no significant deformity or atrophy Skin- warm and dry, no rash or lesion Psych- euthymic mood, full affect Neuro- strength and sensation are intact  EKG is ordered.  Personal review of EKG from today shows bigeminal PVCs  Additional studies reviewed include: Previous EP office notes.   Monitor 07/2019 29.9% PVCs  Assessment and Plan:  1. PVCs Has been refractory to medical therapy on flecainide and toprol Have previously discussed using amiodarone and/or ablation She is interested in proceeding with ablation. Rescheduled to see Dr. [name] in a couple of weeks for lab and scheduling.   Discussed with Dr. [name]. Will need OV with him to discuss and obtain labwork/schedule.   Will only charge for EKG today. She was told she could not keep an appointment with Dr. [name] that she had previously scheduled for next week. This has now been rescheduled.   Graciella Freer, PA-C  04/14/21 9:13 AM

## 2021-04-20 ENCOUNTER — Ambulatory Visit: Payer: 59 | Admitting: Internal Medicine

## 2021-04-20 NOTE — Addendum Note (Signed)
Addended by: Cleda Mccreedy on: 04/20/2021 03:37 PM   Modules accepted: Orders

## 2021-04-21 ENCOUNTER — Telehealth: Payer: Self-pay

## 2021-04-21 NOTE — Telephone Encounter (Signed)
-----   Message from Waymon Budge, MD sent at 04/05/2021 10:35 AM EDT ----- CXR report-  Ok- lungs are clear.  ----- Message ----- From: Interface, Rad Results In Sent: 04/04/2021   2:38 PM EDT To: Waymon Budge, MD

## 2021-04-21 NOTE — Telephone Encounter (Signed)
Patient came into office due to receiving letter in the mail about results. Printed off results and gave to patient. Let her know about the delay in getting lab work back. She expressed understanding. Advised her we would call once we got it back. Nothing further needed at this time.

## 2021-04-22 LAB — ALPHA-1 ANTITRYPSIN PHENOTYPE: A-1 Antitrypsin, Ser: 148 mg/dL (ref 83–199)

## 2021-04-29 ENCOUNTER — Other Ambulatory Visit: Payer: Self-pay

## 2021-04-29 ENCOUNTER — Encounter: Payer: Self-pay | Admitting: Internal Medicine

## 2021-04-29 ENCOUNTER — Ambulatory Visit (INDEPENDENT_AMBULATORY_CARE_PROVIDER_SITE_OTHER): Payer: 59 | Admitting: Internal Medicine

## 2021-04-29 VITALS — BP 110/68 | HR 96 | Ht 67.0 in | Wt 232.0 lb

## 2021-04-29 DIAGNOSIS — I493 Ventricular premature depolarization: Secondary | ICD-10-CM | POA: Diagnosis not present

## 2021-04-29 MED ORDER — FLECAINIDE ACETATE 100 MG PO TABS: 100.0000 mg | ORAL_TABLET | Freq: Two times a day (BID) | ORAL | 3 refills | Status: DC

## 2021-04-29 MED ORDER — METOPROLOL TARTRATE 50 MG PO TABS: 50.0000 mg | ORAL_TABLET | Freq: Two times a day (BID) | ORAL | 3 refills | Status: DC

## 2021-04-29 NOTE — Progress Notes (Signed)
      HPI Christy Holland returns today for followup of symptomatic PVC's. She has been placed on the combination of metorpolol and flecainide and has tolerated these medications. She wore a repeat cardiac monitor on this medical therapy and still has over 25% PVC burden. No syncope and no chest pain. She has previously preserved LV function. She denies chest pain. She presents today to discuss other options. I saw her over a year ago and since then she has continued to be tired and fatigued. She has minimal palpitations.      Allergies  Allergen Reactions   Sulfasalazine Hives   Sulfa Antibiotics                Current Outpatient Medications  Medication Sig Dispense Refill   clonazePAM (KLONOPIN) 0.5 MG tablet 1 or 2 tabs for sleep as needed 30 tablet 0   gabapentin (NEURONTIN) 300 MG capsule Take by mouth daily.       Multiple Vitamin (MULTIVITAMIN) capsule Take 1 capsule by mouth daily.       Vitamin D, Ergocalciferol, (DRISDOL) 1.25 MG (50000 UT) CAPS capsule Take 50,000 Units by mouth every 7 (seven) days.        No current facility-administered medications for this visit.            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     History reviewed. No pertinent surgical history.          Family History  Problem Relation Age of Onset   Hypertension Mother     Hyperlipidemia Mother     Diabetes Mother     Hypertension Father     Hyperlipidemia Father     Diabetes Father     Heart attack Father 70   Stroke Father          Social History         Socioeconomic History   Marital status: Married      Spouse name: Not on file   Number of children: 3   Years of education:  Not on file   Highest education level: Not on file  Occupational History   Not on file  Tobacco Use   Smoking status: Never   Smokeless tobacco: Never  Substance and Sexual Activity   Alcohol use: No      Alcohol/week: 0.0 standard drinks   Drug use: No   Sexual activity: Yes      Birth control/protection: Condom  Other Topics Concern   Not on file  Social History Narrative    ** Merged History Encounter **         Social Determinants of Health    Financial Resource Strain: Not on file  Food Insecurity: Not on file  Transportation Needs: Not on file  Physical Activity: Not on file  Stress: Not on file  Social Connections: Not on file  Intimate Partner Violence: Not on file        BP 110/68   Pulse 96   Ht 5' 7" (1.702 m)   Wt 232 lb (105.2 kg)   SpO2 97%   BMI 36.34 kg/m      Neuro:  CN II through XII intact, motor grossly intact  Assess/Plan:  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 I discussed the indications/risks/benefits/goals/expectations of EP study and catheter ablation and she wishes to proceed. Obesity - after her ablation, she will be encouraged to lose weight.  Sharlot Gowda Layken Doenges,MD

## 2021-04-29 NOTE — Patient Instructions (Addendum)
Medication Instructions:  Your physician has recommended you make the following change in your medication:    START taking flecainide 100 mg-  Take one tablet by mouth twice a day  2.   START taking metoprolol tartrate 50 mg-  Take one tablet by mouth twice day  Labwork: None ordered.  Testing/Procedures: Your physician has recommended that you have an ablation. Catheter ablation is a medical procedure used to treat some cardiac arrhythmias (irregular heartbeats). During catheter ablation, a long, thin, flexible tube is put into a blood vessel in your groin (upper thigh), or neck. This tube is called an ablation catheter. It is then guided to your heart through the blood vessel. Radio frequency waves destroy small areas of heart tissue where abnormal heartbeats may cause an arrhythmia to start. Please see the instruction sheet given to you today.  Follow-Up:  You will come to the [facility] office in 2 weeks for a nurse visit EKG:  May 13, 2021 at 8:00 am  The following dates are available for this procedure: August 15, 22, 26, 29   Any Other Special Instructions Will Be Listed Below (If Applicable).  If you need a refill on your cardiac medications before your next appointment, please call your pharmacy.   Cardiac electrophysiology: From cell to bedside (7th ed., pp. 8657-8469). [place], [place]: Elsevier.">  Cardiac Ablation Cardiac ablation is a procedure to destroy, or ablate, a small amount of heart tissue in very specific places. The heart has many electrical connections. Sometimes these connections are abnormal and can cause the heart to beat very fast or irregularly. Ablating some of the areas that cause problems can improve the heart's rhythm or return it to normal. Ablation may be done for people who: Have Wolff-Parkinson-White syndrome. Have fast heart rhythms (tachycardia). Have taken medicines for an abnormal heart rhythm (arrhythmia) that were not effective or caused side  effects. Have a high-risk heartbeat that may be life-threatening. During the procedure, a small incision is made in the neck or the groin, and a long, thin tube (catheter) is inserted into the incision and moved to the heart. Small devices (electrodes) on the tip of the catheter will send out electrical currents. A type of X-ray (fluoroscopy) will be used to help guide the catheter and to provide images of the heart. Tell a health care provider about: Any allergies you have. All medicines you are taking, including vitamins, herbs, eye drops, creams, and over-the-counter medicines. Any problems you or family members have had with anesthetic medicines. Any blood disorders you have. Any surgeries you have had. Any medical conditions you have, such as kidney failure. Whether you are pregnant or may be pregnant. What are the risks? Generally, this is a safe procedure. However, problems may occur, including: Infection. Bruising and bleeding at the catheter insertion site. Bleeding into the chest, especially into the sac that surrounds the heart. This is a serious complication. Stroke or blood clots. Damage to nearby structures or organs. Allergic reaction to medicines or dyes. Need for a permanent pacemaker if the normal electrical system is damaged. A pacemaker is a small computer that sends electrical signals to the heart and helps your heart beat normally. The procedure not being fully effective. This may not be recognized until months later. Repeat ablation procedures are sometimes done. What happens before the procedure? Medicines Ask your health care provider about: Changing or stopping your regular medicines. This is especially important if you are taking diabetes medicines or blood thinners. Taking medicines  such as aspirin and ibuprofen. These medicines can thin your blood. Do not take these medicines unless your health care provider tells you to take them. Taking over-the-counter  medicines, vitamins, herbs, and supplements. General instructions Follow instructions from your health care provider about eating or drinking restrictions. Plan to have someone take you home from the hospital or clinic. If you will be going home right after the procedure, plan to have someone with you for 24 hours. Ask your health care provider what steps will be taken to prevent infection. What happens during the procedure?  An IV will be inserted into one of your veins. You will be given a medicine to help you relax (sedative). The skin on your neck or groin will be numbed. An incision will be made in your neck or your groin. A needle will be inserted through the incision and into a large vein in your neck or groin. A catheter will be inserted into the needle and moved to your heart. Dye may be injected through the catheter to help your surgeon see the area of the heart that needs treatment. Electrical currents will be sent from the catheter to ablate heart tissue in desired areas. There are three types of energy that may be used to do this: Heat (radiofrequency energy). Laser energy. Extreme cold (cryoablation). When the tissue has been ablated, the catheter will be removed. Pressure will be held on the insertion area to prevent a lot of bleeding. A bandage (dressing) will be placed over the insertion area. The exact procedure may vary among health care providers and hospitals. What happens after the procedure? Your blood pressure, heart rate, breathing rate, and blood oxygen level will be monitored until you leave the hospital or clinic. Your insertion area will be monitored for bleeding. You will need to lie still for a few hours to ensure that you do not bleed from the insertion area. Do not drive for 24 hours or as long as told by your health care provider. Summary Cardiac ablation is a procedure to destroy, or ablate, a small amount of heart tissue using an electrical current.  This procedure can improve the heart rhythm or return it to normal. Tell your health care provider about any medical conditions you may have and all medicines you are taking to treat them. This is a safe procedure, but problems may occur. Problems may include infection, bruising, damage to nearby organs or structures, or allergic reactions to medicines. Follow your health care provider's instructions about eating and drinking before the procedure. You may also be told to change or stop some of your medicines. After the procedure, do not drive for 24 hours or as long as told by your health care provider. This information is not intended to replace advice given to you by your health care provider. Make sure you discuss any questions you have with your healthcare provider. Document Revised: 08/19/2019 Document Reviewed: 08/19/2019 Elsevier Patient Education  2022 [employer].

## 2021-05-05 ENCOUNTER — Telehealth: Payer: Self-pay | Admitting: Internal Medicine

## 2021-05-05 NOTE — Telephone Encounter (Signed)
Follow up:     Patient returning a call back stating some called her a few times. I did not see a note. Please advise.

## 2021-05-06 NOTE — Telephone Encounter (Signed)
Returned call to Pt.  Advised this nurse had not called.  Unable to determine who may have called Pt.  Reminded Pt of nurse visit.

## 2021-05-12 ENCOUNTER — Ambulatory Visit: Payer: 59 | Admitting: Family Medicine

## 2021-05-12 ENCOUNTER — Encounter: Payer: Self-pay | Admitting: Family Medicine

## 2021-05-13 ENCOUNTER — Other Ambulatory Visit: Payer: Self-pay

## 2021-05-13 ENCOUNTER — Ambulatory Visit: Payer: 59

## 2021-05-17 ENCOUNTER — Encounter: Payer: Self-pay | Admitting: Student

## 2021-05-25 ENCOUNTER — Telehealth: Payer: Self-pay

## 2021-05-25 DIAGNOSIS — I493 Ventricular premature depolarization: Secondary | ICD-10-CM

## 2021-05-31 NOTE — Telephone Encounter (Signed)
Call received from Pt.  Pt scheduled for PVC ablation on June 18, 2021 at 7:30 am

## 2021-06-01 NOTE — Telephone Encounter (Signed)
Work up complete. 

## 2021-06-03 ENCOUNTER — Telehealth: Payer: Self-pay | Admitting: *Deleted

## 2021-06-03 NOTE — Telephone Encounter (Signed)
Called to notify pt that Ablation time has been changed to 0930 and she needs to arrive at 0730. No answer, voicemail has not been set up.

## 2021-06-04 ENCOUNTER — Encounter: Payer: Self-pay | Admitting: *Deleted

## 2021-06-05 ENCOUNTER — Other Ambulatory Visit: Payer: Self-pay | Admitting: Internal Medicine

## 2021-06-07 NOTE — Telephone Encounter (Signed)
Pt is requesting a refill of the clonazepam.   Last OV was 04/01/2021 with CY  Next OV is 07/02/2021 with CY  Last refill was given on 04/01/2021 for #30 with no refills.    CY please advise on refill. Thanks

## 2021-06-08 NOTE — Telephone Encounter (Signed)
Called pt no answer. Unable to leave msg d/t VM not being set up.

## 2021-06-08 NOTE — Telephone Encounter (Signed)
Clonazepam refilled pending her return ov

## 2021-06-10 ENCOUNTER — Other Ambulatory Visit: Payer: 59

## 2021-06-10 ENCOUNTER — Other Ambulatory Visit: Payer: Self-pay

## 2021-06-10 DIAGNOSIS — I493 Ventricular premature depolarization: Secondary | ICD-10-CM

## 2021-06-10 LAB — BASIC METABOLIC PANEL
BUN/Creatinine Ratio: 9 (ref 9–23)
BUN: 7 mg/dL (ref 6–24)
CO2: 29 mmol/L (ref 20–29)
Calcium: 9.4 mg/dL (ref 8.7–10.2)
Chloride: 102 mmol/L (ref 96–106)
Creatinine, Ser: 0.77 mg/dL (ref 0.57–1.00)
Glucose: 101 mg/dL — ABNORMAL HIGH (ref 65–99)
Potassium: 4.2 mmol/L (ref 3.5–5.2)
Sodium: 140 mmol/L (ref 134–144)
eGFR: 95 mL/min/{1.73_m2} (ref >59)

## 2021-06-10 LAB — CBC WITH DIFFERENTIAL/PLATELET
Basophils Absolute: 0 10*3/uL (ref 0.0–0.2)
Basos: 0 %
EOS (ABSOLUTE): 0 10*3/uL (ref 0.0–0.4)
Eos: 1 %
Hematocrit: 38.8 % (ref 34.0–46.6)
Hemoglobin: 13 g/dL (ref 11.1–15.9)
Lymphocytes Absolute: 3 10*3/uL (ref 0.7–3.1)
Lymphs: 56 %
MCH: 29.9 pg (ref 26.6–33.0)
MCHC: 33.5 g/dL (ref 31.5–35.7)
MCV: 89 fL (ref 79–97)
Monocytes Absolute: 0.4 10*3/uL (ref 0.1–0.9)
Monocytes: 7 %
Neutrophils Absolute: 2 10*3/uL (ref 1.4–7.0)
Neutrophils: 36 %
Platelets: 196 10*3/uL (ref 150–450)
RBC: 4.35 x10E6/uL (ref 3.77–5.28)
RDW: 13.4 % (ref 11.7–15.4)
WBC: 5.4 10*3/uL (ref 3.4–10.8)

## 2021-06-15 ENCOUNTER — Telehealth: Payer: Self-pay | Admitting: *Deleted

## 2021-06-15 NOTE — Telephone Encounter (Signed)
Called to notify pt that ablation time has been moved. No answer. Voice mail not set up.

## 2021-06-17 NOTE — Pre-Procedure Instructions (Signed)
Attempted to call patient regarding procedure instructions for tomorrow's procedure.  No voicemail set up.

## 2021-06-18 ENCOUNTER — Other Ambulatory Visit: Payer: Self-pay

## 2021-06-18 ENCOUNTER — Ambulatory Visit (HOSPITAL_COMMUNITY)
Admission: RE | Admit: 2021-06-18 | Discharge: 2021-06-18 | Disposition: A | Discharge status: Home / Self Care | Payer: Medicaid Other | Attending: Internal Medicine | Admitting: Internal Medicine

## 2021-06-18 ENCOUNTER — Encounter (HOSPITAL_COMMUNITY)
Admission: RE | Disposition: A | Discharge status: Home / Self Care | Payer: Self-pay | Source: Home / Self Care | Attending: Internal Medicine

## 2021-06-18 DIAGNOSIS — E669 Obesity, unspecified: Secondary | ICD-10-CM | POA: Insufficient documentation

## 2021-06-18 DIAGNOSIS — Z882 Allergy status to sulfonamides status: Secondary | ICD-10-CM | POA: Diagnosis not present

## 2021-06-18 DIAGNOSIS — Z6836 Body mass index (BMI) 36.0-36.9, adult: Secondary | ICD-10-CM | POA: Insufficient documentation

## 2021-06-18 DIAGNOSIS — I493 Ventricular premature depolarization: Secondary | ICD-10-CM | POA: Insufficient documentation

## 2021-06-18 DIAGNOSIS — Z79899 Other long term (current) drug therapy: Secondary | ICD-10-CM | POA: Diagnosis not present

## 2021-06-18 HISTORY — PX: PVC ABLATION: EP1236

## 2021-06-18 LAB — POCT ACTIVATED CLOTTING TIME
Activated Clotting Time: 196 seconds
Activated Clotting Time: 173 seconds

## 2021-06-18 LAB — HCG, SERUM, QUALITATIVE: Preg, Serum: NEGATIVE

## 2021-06-18 SURGERY — PVC ABLATION

## 2021-06-18 MED ORDER — HEPARIN (PORCINE) IN NACL 1000-0.9 UT/500ML-% IV SOLN
INTRAVENOUS | Status: DC | PRN
  Administered 2021-06-18 (×2): 500 mL

## 2021-06-18 MED ORDER — MIDAZOLAM HCL 5 MG/5ML IJ SOLN
INTRAMUSCULAR | Status: AC
  Filled 2021-06-18: qty 5

## 2021-06-18 MED ORDER — MIDAZOLAM HCL 5 MG/5ML IJ SOLN
INTRAMUSCULAR | Status: DC | PRN
  Administered 2021-06-18 (×3): 1 mg via INTRAVENOUS
  Administered 2021-06-18: 2 mg via INTRAVENOUS
  Administered 2021-06-18 (×3): 1 mg via INTRAVENOUS

## 2021-06-18 MED ORDER — BUPIVACAINE HCL (PF) 0.25 % IJ SOLN
INTRAMUSCULAR | Status: AC
  Filled 2021-06-18: qty 30

## 2021-06-18 MED ORDER — FENTANYL CITRATE (PF) 100 MCG/2ML IJ SOLN
INTRAMUSCULAR | Status: AC
  Filled 2021-06-18: qty 2

## 2021-06-18 MED ORDER — HEPARIN SODIUM (PORCINE) 1000 UNIT/ML IJ SOLN
INTRAMUSCULAR | Status: AC
  Filled 2021-06-18: qty 1

## 2021-06-18 MED ORDER — BUPIVACAINE HCL (PF) 0.25 % IJ SOLN
INTRAMUSCULAR | Status: DC | PRN
  Administered 2021-06-18: 60 mL

## 2021-06-18 MED ORDER — SODIUM CHLORIDE 0.9 % IV SOLN: INTRAVENOUS | Status: DC

## 2021-06-18 MED ORDER — HEPARIN SODIUM (PORCINE) 1000 UNIT/ML IJ SOLN
INTRAMUSCULAR | Status: DC | PRN
  Administered 2021-06-18: 7000 [IU] via INTRAVENOUS
  Administered 2021-06-18: 1000 [IU] via INTRAVENOUS
  Administered 2021-06-18: 2000 [IU] via INTRAVENOUS

## 2021-06-18 MED ORDER — FENTANYL CITRATE (PF) 100 MCG/2ML IJ SOLN
INTRAMUSCULAR | Status: DC | PRN
  Administered 2021-06-18: 25 ug via INTRAVENOUS
  Administered 2021-06-18 (×6): 12.5 ug via INTRAVENOUS

## 2021-06-18 MED ORDER — ONDANSETRON HCL 4 MG/2ML IJ SOLN: 4.0000 mg | Freq: Four times a day (QID) | INTRAMUSCULAR | Status: DC | PRN

## 2021-06-18 SURGICAL SUPPLY — 15 items
BAG SNAP BAND KOVER 36X36 (MISCELLANEOUS) ×2 IMPLANT
CATH JOSEPH QUAD ALLRED 6F REP (CATHETERS) ×2 IMPLANT
CATH JOSEPHSON QUAD-ALLRED 6FR (CATHETERS) ×2 IMPLANT
CATH SMTCH THERMOCOOL SF DF (CATHETERS) ×2 IMPLANT
CATH WEBSTER BI DIR CS D-F CRV (CATHETERS) IMPLANT
MAT PREVALON FULL STRYKER (MISCELLANEOUS) ×2 IMPLANT
PACK EP LATEX FREE (CUSTOM PROCEDURE TRAY) ×2
PACK EP LF (CUSTOM PROCEDURE TRAY) ×1 IMPLANT
PAD PRO RADIOLUCENT 2001M-C (PAD) ×2 IMPLANT
PATCH CARTO3 (PAD) ×2 IMPLANT
SHEATH PINNACLE 6F 10CM (SHEATH) ×2 IMPLANT
SHEATH PINNACLE 7F 10CM (SHEATH) ×2 IMPLANT
SHEATH PINNACLE 8F 10CM (SHEATH) ×4 IMPLANT
SHIELD RADPAD SCOOP 12X17 (MISCELLANEOUS) ×2 IMPLANT
TUBING SMART ABLATE COOLFLOW (TUBING) ×2 IMPLANT

## 2021-06-18 NOTE — Discharge Instructions (Addendum)
Post procedure care instructions No driving for 4 days. No lifting over 5 lbs for 1 week. No vigorous or sexual activity for 1 week. You may return to work/your usual activities on 06/26/21. Keep procedure site clean & dry. If you notice increased pain, swelling, bleeding or pus, call/return!  You may shower after 24 hours, but no soaking in baths/hot tubs/pools for 1 week.  Cardiac Ablation, Care After  This sheet gives you information about how to care for yourself after your procedure. Your health care provider may also give you more specific instructions. If you have problems or questions, contact your health care provider. What can I expect after the procedure? After the procedure, it is common to have: Bruising around your puncture site. Tenderness around your puncture site. Skipped heartbeats. Tiredness (fatigue).  Follow these instructions at home: Puncture site care  Follow instructions from your health care provider about how to take care of your puncture site. Make sure you: If present, leave stitches (sutures), skin glue, or adhesive strips in place. These skin closures may need to stay in place for up to 2 weeks. If adhesive strip edges start to loosen and curl up, you may trim the loose edges. Do not remove adhesive strips completely unless your health care provider tells you to do that. If a large square bandage is present, this may be removed 24 hours after surgery.  Check your puncture site every day for signs of infection. Check for: Redness, swelling, or pain. Fluid or blood. If your puncture site starts to bleed, lie down on your back, apply firm pressure to the area, and contact your health care provider. Warmth. Pus or a bad smell. Driving Do not drive for at least 4 days after your procedure or however long your health care provider recommends. (Do not resume driving if you have previously been instructed not to drive for other health reasons.) Do not drive or use heavy  machinery while taking prescription pain medicine. Activity Avoid activities that take a lot of effort for at least 7 days after your procedure. Do not lift anything that is heavier than 5 lb (4.5 kg) for one week.  No sexual activity for 1 week.  Return to your normal activities as told by your health care provider. Ask your health care provider what activities are safe for you. General instructions Take over-the-counter and prescription medicines only as told by your health care provider. Do not use any products that contain nicotine or tobacco, such as cigarettes and e-cigarettes. If you need help quitting, ask your health care provider. You may shower after 24 hours, but Do not take baths, swim, or use a hot tub for 1 week.  Do not drink alcohol for 24 hours after your procedure. Keep all follow-up visits as told by your health care provider. This is important. Contact a health care provider if: You have redness, mild swelling, or pain around your puncture site. You have fluid or blood coming from your puncture site that stops after applying firm pressure to the area. Your puncture site feels warm to the touch. You have pus or a bad smell coming from your puncture site. You have a fever. You have chest pain or discomfort that spreads to your neck, jaw, or arm. You are sweating a lot. You feel nauseous. You have a fast or irregular heartbeat. You have shortness of breath. You are dizzy or light-headed and feel the need to lie down. You have pain or numbness in the   arm or leg closest to your puncture site. Get help right away if: Your puncture site suddenly swells. Your puncture site is bleeding and the bleeding does not stop after applying firm pressure to the area. These symptoms may represent a serious problem that is an emergency. Do not wait to see if the symptoms will go away. Get medical help right away. Call your local emergency services (911 in the [place] Do not drive yourself  to the hospital. Summary After the procedure, it is normal to have bruising and tenderness at the puncture site in your groin, neck, or forearm. Check your puncture site every day for signs of infection. Get help right away if your puncture site is bleeding and the bleeding does not stop after applying firm pressure to the area. This is a medical emergency. This information is not intended to replace advice given to you by your health care provider. Make sure you discuss any questions you have with your health care provider.   

## 2021-06-18 NOTE — Progress Notes (Signed)
Site area: Right groin a 8 french arterial and 6,7,and 8 french venous sheaths were removed  Site Prior to Removal:  Level 0  Pressure Applied For 20 MINUTES    Bedrest Beginning at 1430p X 4 hours  Manual:   Yes.    Patient Status During Pull:  stable  Post Pull Groin Site:  Level 0  Post Pull Instructions Given:  Yes.    Post Pull Pulses Present:  Yes.    Dressing Applied:  Yes.    Comments:

## 2021-06-18 NOTE — H&P (Signed)
HPI Christy Holland returns today for followup of symptomatic PVC's. She has been placed on the combination of metorpolol and flecainide and has tolerated these medications. She wore a repeat cardiac monitor on this medical therapy and still has over 25% PVC burden. No syncope and no chest pain. She has previously preserved LV function. She denies chest pain. She presents today to discuss other options. I saw her over a year ago and since then she has continued to be tired and fatigued. She has minimal palpitations.      Allergies  Allergen Reactions   Sulfasalazine Hives   Sulfa Antibiotics                Current Outpatient Medications  Medication Sig Dispense Refill   clonazePAM (KLONOPIN) 0.5 MG tablet 1 or 2 tabs for sleep as needed 30 tablet 0   gabapentin (NEURONTIN) 300 MG capsule Take by mouth daily.       Multiple Vitamin (MULTIVITAMIN) capsule Take 1 capsule by mouth daily.       Vitamin D, Ergocalciferol, (DRISDOL) 1.25 MG (50000 UT) CAPS capsule Take 50,000 Units by mouth every 7 (seven) days.        No current facility-administered medications for this visit.            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     History reviewed. No pertinent surgical history.          Family History  Problem Relation Age of Onset   Hypertension Mother     Hyperlipidemia Mother     Diabetes Mother     Hypertension Father     Hyperlipidemia Father     Diabetes Father     Heart attack Father 110   Stroke Father          Social History         Socioeconomic History   Marital status: Married      Spouse name: Not on file   Number of children: 3   Years of education:  Not on file   Highest education level: Not on file  Occupational History   Not on file  Tobacco Use   Smoking status: Never   Smokeless tobacco: Never  Substance and Sexual Activity   Alcohol use: No      Alcohol/week: 0.0 standard drinks   Drug use: No   Sexual activity: Yes      Birth control/protection: Condom  Other Topics Concern   Not on file  Social History Narrative    ** Merged History Encounter **         Social Determinants of Health    Financial Resource Strain: Not on file  Food Insecurity: Not on file  Transportation Needs: Not on file  Physical Activity: Not on file  Stress: Not on file  Social Connections: Not on file  Intimate Partner Violence: Not on file        BP 110/68   Pulse 96   Ht 5\' 7"  (1.702 m)   Wt 232 lb (105.2 kg)   SpO2 97%   BMI 36.34 kg/m  Physical Exam:   Well appearing but overweight woman, NAD HEENT: Unremarkable Neck:  No JVD, no thyromegally Lymphatics:  No adenopathy Back:  No CVA tenderness Lungs:  Clear with no wheezes HEART:  IRegular rate rhythm, no murmurs, no rubs, no clicks Abd:  soft, positive bowel sounds, no organomegally, no rebound, no guarding Ext:  2 plus pulses, no edema, no cyanosis, no clubbing Skin:  No rashes no nodules Neuro:  CN II through XII intact, motor grossly intact   Assess/Plan:  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 I discussed the indications/risks/benefits/goals/expectations of EP study and catheter ablation and she wishes to proceed. Obesity - after her ablation, she will be encouraged to lose weight.   Sharlot Gowda Little [place],MD

## 2021-06-20 ENCOUNTER — Encounter (HOSPITAL_COMMUNITY): Payer: Self-pay | Admitting: Internal Medicine

## 2021-07-01 ENCOUNTER — Encounter: Payer: Self-pay | Admitting: Internal Medicine

## 2021-07-01 NOTE — Progress Notes (Signed)
04/01/21- 49 yoF never smoker for sleep evaluation- self referred Sleep issues, uses CPAP/ [facility] Medical problem list includes  PVCs, Chronic Rhinitis, Chronic Maxillary Sinusitis, Tonsil Hypertrophy,  HST ([facility]) 05/08/2019- AHI 6.6/ hr, body weight 218 lbs Epworth score-10 Body Weight today- 231 lbs Loud snoring. Complains of difficulty initiating and maintaining sleep. CPAP does help feel better with les daytime fatigue and headache. Tried melatonin and trazodone. [facility] won't give anything else. Naps not much help. No caffeine. Denies ENT surgery, heart, lung or thyroid problems. Denies complex parasomnias. Also concerned about dyspnea on exertion and says family history of COPD but nonsmokers.  No cough or wheeze.  07/02/21- 50 yoF never smoker followed for Insomnia, Minimal OSA( 6.6/ hr)/ Snoring,  complicated by Dyspnea on exertion, PVCs/ Ablation, Chronic Rhinitis, Chronic Maxillary /sinusitis, Tonsil Hypertrophy, Obesity,  -Clonazepam 0.5 mg 1-2 CPAP  auto 5-11/ [facility]  [place] [employer] - compliance 83%, AHI 2.6/ hr Had cardiology ablation for PVCs Body weight today-233 lbs Covid vax-3  Would like tro try higher dose of clonazepam Discussed flu and pneumonia vaccines> both today  ROS-see HPI   + = positive Constitutional:    weight loss, night sweats, fevers, chills, f+atigue, lassitude. HEENT:    +headaches, difficulty swallowing, tooth/dental problems, sore throat,       sneezing, itching, ear ache, nasal congestion, post nasal drip, snoring CV:    chest pain, orthopnea, PND, swelling in lower extremities, anasarca,                                   dizziness, palpitations Resp:   shortness of breath with exertion or at rest.                productive cough,   non-productive cough, coughing up of blood.              change in color of mucus.  wheezing.   Skin:    rash or lesions. GI:  No-   heartburn, indigestion, abdominal pain, nausea, vomiting, diarrhea,                  change in bowel habits, loss of appetite GU: dysuria, change in color of urine, no urgency or frequency.   flank pain. MS:   joint pain, stiffness, decreased range of motion, back pain. Neuro-     nothing unusual Psych:  change in mood or affect.  depression or anxiety.   memory loss.  OBJ- Physical Exam General- Alert, Oriented, Affect-appropriate, Distress- none acute, + obese Skin- rash-none, lesions- none, excoriation- none Lymphadenopathy- none Head- atraumatic            Eyes- Gross vision intact, PERRLA, conjunctivae and secretions clear            Ears- Hearing, canals-normal            Nose- Clear, no-Septal dev, mucus, polyps, erosion, perforation             Throat- Mallampati II , mucosa clear , drainage- none, tonsils- atrophic, + teeth Neck- flexible , trachea midline, no stridor , thyroid nl, carotid no bruit Chest - symmetrical excursion , unlabored           Heart/CV- RRR , no murmur , no gallop  , no rub, nl s1 s2                           -  JVD- none , edema- none, stasis changes- none, varices- none           Lung- clear to P&A, wheeze- none, cough- none , dullness-none, rub- none           Chest wall-  Abd-  Br/ Gen/ Rectal- Not done, not indicated Extrem- cyanosis- none, clubbing, none, atrophy- none, strength- nl Neuro- grossly intact to observation     

## 2021-07-02 ENCOUNTER — Ambulatory Visit: Payer: Medicaid Other | Admitting: Internal Medicine

## 2021-07-02 ENCOUNTER — Other Ambulatory Visit: Payer: Self-pay

## 2021-07-02 ENCOUNTER — Encounter: Payer: Self-pay | Admitting: Internal Medicine

## 2021-07-02 VITALS — BP 118/82 | HR 65 | Temp 98.6°F | Ht 67.0 in | Wt 233.0 lb

## 2021-07-02 DIAGNOSIS — Z23 Encounter for immunization: Secondary | ICD-10-CM | POA: Diagnosis not present

## 2021-07-02 DIAGNOSIS — R0600 Dyspnea, unspecified: Secondary | ICD-10-CM | POA: Diagnosis not present

## 2021-07-02 DIAGNOSIS — G4733 Obstructive sleep apnea (adult) (pediatric): Secondary | ICD-10-CM | POA: Diagnosis not present

## 2021-07-02 DIAGNOSIS — R0609 Other forms of dyspnea: Secondary | ICD-10-CM

## 2021-07-02 MED ORDER — CLONAZEPAM 0.5 MG PO TABS: ORAL_TABLET | ORAL | 2 refills | Status: DC

## 2021-07-02 NOTE — Assessment & Plan Note (Signed)
Flu and Pneumovax 23 vaccines given as disscussed

## 2021-07-02 NOTE — Patient Instructions (Signed)
Order- standard flu vax  Order- pneumovax-23  Script sent to allow increased clonazepam dose so you can take from 0-4 tabs for sleep if needed  Order- DME [facility]) please continue CPAP auto 5-11, mask of choice, humidifier, download capable  Please call if we can help

## 2021-07-02 NOTE — Assessment & Plan Note (Signed)
Benefits with good compliance and control Plan- continue auto 5-11. Ok to try up to 4 x 0.5 mg clonazepam for sleep as discusssed

## 2021-07-16 ENCOUNTER — Ambulatory Visit: Payer: 59 | Admitting: Internal Medicine

## 2021-08-10 ENCOUNTER — Ambulatory Visit: Payer: 59 | Admitting: Family Medicine

## 2021-08-17 NOTE — Patient Instructions (Incomplete)

## 2021-08-17 NOTE — Progress Notes (Deleted)
PATIENT: Christy Holland DOB: September 20, 1971  REASON FOR VISIT: follow up HISTORY FROM: patient  No chief complaint on file.    HISTORY OF PRESENT ILLNESS: 08/17/21 ALL: Christy Holland returns for follow up for OSA on CPAP and insomnia. She continues trazodone 150mg  QHS. CPAP. Psychiatry?  02/11/2021 ALL:  Christy Holland returns for follow up for OSA on CPAP and insomnia. She continues fairly regular use of CPAP therapy. She also continues trazodone 150mg  daily. She continued to have difficulty sleeping and was encouraged to establish care with PCP and to consider psychiatry referral for management of anxiety. She has not yet scheduled an appt with PCP. She tried to get an appt with psychiatry but was having a hard time finding someone to take her insurance. She feels that she is doing ok. Still having difficulty with sleep. No concerns with CPAP. Trazodone is helping.   Compliance report dated 01/11/2021-02/10/2021 revelas that she used CPAP 25/30 days. 4 hour compliance was 77.4%. Residual AHI was 2.4/hr on 5-11cmH20. No significant leak noted.   11/12/2020 ALL:  Christy Holland returns for follow up on OSA and insomnia. We increased trazodone to 150mg  at bedtime in 07/2020. It helps to get her to sleep but continuing to have trouble staying asleep. She usually has to use the bathroom and then has difficulty getting back to sleep. She gets 3-4 ours each night. She feels that her mind starts working and she can't return to sleep. She continues to care for her mother who is ill. She travels to [facility] often to take care of her. Sometimes she does not take her CPAP machine with her. She denies any concerns with CPAP machine or supplies. No difficulty with tolerability of therapy.   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 Cardiology workup showed 30% PVC burden but no other significant findings. She was started on flecainide and metoprolol but discontinued these medications several months ago. She  is uncertain why, She does not have PCP. Last labs in 2018.   Compliance report dated 10/03/2020 through 11/03/2020 reveals that she used CPAP 20 one of the past 30 days for compliance of 65.6%.  She used CPAP greater than 4 hours 59.4%.  Residual AHI was 2.7 on 5 to 11 cm of water.  There was no significant leak noted.   08/10/2020 ALL: Christy Holland is a 50 y.o. female here today for follow up for OSA on CPAP. She has continues intermittent use of CPAP therapy. She continues trazodone 100mg  for insomnia. She feels that she is able to go to sleep but unable to stay asleep. She usually has to use the restroom and is unable to get back to sleep. She usually takes trazodone around 9:30, goes to bed around 10pm. Sometimes she may fall asleep watching TV, sometimes not. She usually wakes around 2am and is awake for about an hour.  Sometimes she may drift back off to sleep around 3 AM and then wakes at 5:30 AM.  She does not feel anxious or depressed throughout the day.  She admits to inconsistent sleep patterns as her mother is ill.  She has been traveling to [facility] to help take care of her.  She has not been using CPAP when traveling.  Compliance report dated 07/06/2020 through 08/04/2020 reveals that she used CPAP 19 of the past 30 days for compliance of 63%.  She is CPAP greater than 4 hours 53% of the time.  Residual AHI was 2.6 on 5  to 11 cm of water.  No significant leak noted.  HISTORY: (copied from my note on 02/05/2020)  Christy Holland is a 50 y.o. female here today for follow up.  She is doing well with CPAP therapy.  She is using her machine every night.  She continues to have difficulty with insomnia.  We started her on trazodone 50 mg at bedtime.  This has been helpful in helping her get to sleep but she continues to have trouble staying asleep.  She is interested in increasing the dose.  She has tried Benadryl and melatonin in the past with no relief.  She does not feel that anxiety or  depression are contributing but is followed closely by her primary care provider.   Compliance report dated 01/06/2020 through 02/04/2020 reveals that she used CPAP 30 of the past 30 days for compliance of 100%.  She used CPAP greater than 4 hours 73.3% of the time.  Average usage was 4 hours and 59 minutes.  Residual AHI was 3.8 on 5 to 11 cm of water pressure and an EPR of 3.  There was no significant leak noted.   HISTORY: (copied from my note on 11/07/2019)   Christy Holland is a 50 y.o. female here today for follow up for OSA on CPAP.  She reports that she is doing fairly well on CPAP therapy.  She is working towards nightly use and meeting the 4-hour recommendation.  She denies difficulty with CPAP machine.  She does continue to note insomnia.  She has tried over-the-counter options including melatonin and Benadryl with no success.   Compliance report dated 10/08/2019 through 11/06/2019 reveals that she used CPAP 28 of the last 30 days for compliance of 93.3%.  She used CPAP greater than 4 hours 83% of time.  Average usage was 5 hours and 27 minutes.  Residual AHI was 3.8 on 5 to 11 cm of water and an EPR of 3.  There was no significant leak noted.  REVIEW OF SYSTEMS: Out of a complete 14 system review of symptoms, the patient complains only of the following symptoms, insomnia and all other reviewed systems are negative.  ESS: 10   ALLERGIES: Allergies  Allergen Reactions   Sulfasalazine Hives   Sulfa Antibiotics     HOME MEDICATIONS: Outpatient Medications Prior to Visit  Medication Sig Dispense Refill   clonazePAM (KLONOPIN) 0.5 MG tablet TAKE 1 to 4 TABLETS BY MOUTH FOR SLEEP AS NEEDED 90 tablet 2   gabapentin (NEURONTIN) 300 MG capsule Take by mouth daily.     metoprolol tartrate (LOPRESSOR) 50 MG tablet Take 1 tablet (50 mg total) by mouth 2 (two) times daily. 180 tablet 3   Multiple Vitamin (MULTIVITAMIN) capsule Take 1 capsule by mouth daily.     Vitamin D, Ergocalciferol,  (DRISDOL) 1.25 MG (50000 UT) CAPS capsule Take 50,000 Units by mouth every 7 (seven) days.     No facility-administered medications prior to visit.    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   Rhinitis, chronic 08/21/2017   Sinusitis 02/13/2018   Snoring 08/21/2017    PAST SURGICAL HISTORY: Past Surgical History:  Procedure Laterality Date   PVC ABLATION N/A 06/18/2021   Procedure: PVC ABLATION;  Surgeon: Marinus Maw, MD;  Location: [facility];  Service: Cardiovascular;  Laterality: N/A;  FAMILY HISTORY: Family History  Problem Relation Age of Onset   Hypertension Mother    Hyperlipidemia Mother    Diabetes Mother    Hypertension Father    Hyperlipidemia Father    Diabetes Father    Heart attack Father 62   Stroke Father     SOCIAL HISTORY: Social History   Socioeconomic History   Marital status: Married    Spouse name: Not on file   Number of children: 3   Years of education: Not on file   Highest education level: Not on file  Occupational History   Not on file  Tobacco Use   Smoking status: Never   Smokeless tobacco: Never  Substance and Sexual Activity   Alcohol use: No    Alcohol/week: 0.0 standard drinks   Drug use: No   Sexual activity: Yes    Birth control/protection: Condom  Other Topics Concern   Not on file  Social History Narrative   ** Merged History Encounter **       Social Determinants of Health   Financial Resource Strain: Not on file  Food Insecurity: Not on file  Transportation Needs: Not on file  Physical Activity: Not on file  Stress: Not on file  Social Connections: Not on file  Intimate Partner Violence: Not on file     PHYSICAL EXAM  There were no vitals filed for this visit.  There is no height or weight on file to calculate  BMI.  Generalized: Well developed, in no acute distress  Cardiology: normal rate and rhythm, PVC's, no murmur noted Respiratory: clear to auscultation bilaterally  Neurological examination  Mentation: Alert oriented to time, place, history taking. Follows all commands speech and language fluent Cranial nerve II-XII: Pupils were equal round reactive to light. Extraocular movements were full, visual field were full  Motor: The motor testing reveals 5 over 5 strength of all 4 extremities. Good symmetric motor tone is noted throughout.  Gait and station: Gait is normal.    DIAGNOSTIC DATA (LABS, IMAGING, TESTING) - I reviewed patient records, labs, notes, testing and imaging myself where available.  No flowsheet data found.   Lab Results  Component Value Date   WBC 5.4 06/10/2021   HGB 13.0 06/10/2021   HCT 38.8 06/10/2021   MCV 89 06/10/2021   PLT 196 06/10/2021      Component Value Date/Time   NA 140 06/10/2021 1248   K 4.2 06/10/2021 1248   CL 102 06/10/2021 1248   CO2 29 06/10/2021 1248   GLUCOSE 101 (H) 06/10/2021 1248   BUN 7 06/10/2021 1248   CREATININE 0.77 06/10/2021 1248   CALCIUM 9.4 06/10/2021 1248   PROT 6.9 07/31/2017 1712   ALBUMIN 4.2 07/31/2017 1712   AST 26 07/31/2017 1712   ALT 11 07/31/2017 1712   ALKPHOS 63 07/31/2017 1712   BILITOT <0.2 07/31/2017 1712   GFRNONAA 90 07/31/2017 1712   GFRAA 104 07/31/2017 1712   No results found for: CHOL, HDL, LDLCALC, LDLDIRECT, TRIG, CHOLHDL No results found for: PPIR5J No results found for: VITAMINB12 Lab Results  Component Value Date   TSH 1.450 07/31/2017     ASSESSMENT AND PLAN 50 y.o. year old female  has a past medical history of Chronic maxillary sinusitis (08/21/2017), Frequent PVCs, Holter monitor, abnormal (08/22/2019), Hypertrophy of tonsil and adenoid (08/21/2017), Rhinitis, chronic (08/21/2017), Sinusitis (02/13/2018), and Snoring (08/21/2017). here with   No diagnosis found.    Christy Holland is doing much better with  compliance. Current download shows optimal compliance with daily and 4 hour use. We will update supply orders as indicated. Risks of untreated sleep apnea review and education materials provided.  She continues to struggle with insomnia.  She does feel trazodone is helping.  We have continued our discussion regarding the causes of insomnia.  I feel that inconsistent sleep patterns in combination with increased stress levels are contributing.  She does not have a primary care provider.  She does have a history of vitamin D deficiency as well as frequent PVCs.  She is no longer on beta-blocker or antiarrhythmic.  TSH was normal in 2018 but has not had labs since.  I have reiterated the importance of having a primary care provider to help check in on her routinely.  We will continue trazodone to 150 mg every night about 30 to 45 minutes before bedtime.  We have reviewed sleep hygiene and I have encouraged her to work on these at home. She was provided contact information for local psychiatry providers. Healthy lifestyle habits encouraged. She will follow up in 12 months, sooner if needed. She verbalizes understanding and agreement with this plan.    No orders of the defined types were placed in this encounter.    No orders of the defined types were placed in this encounter.      Shawnie Dapper, FNP-C 08/17/2021, 12:29 PM [facility] [address], [address] [place], [place] [address] [phone]

## 2021-08-18 ENCOUNTER — Telehealth: Payer: Self-pay

## 2021-08-18 ENCOUNTER — Ambulatory Visit (INDEPENDENT_AMBULATORY_CARE_PROVIDER_SITE_OTHER): Payer: Medicaid Other | Admitting: Internal Medicine

## 2021-08-18 ENCOUNTER — Encounter: Payer: Self-pay | Admitting: Internal Medicine

## 2021-08-18 ENCOUNTER — Other Ambulatory Visit: Payer: Self-pay

## 2021-08-18 VITALS — BP 124/80 | HR 93 | Ht 67.0 in | Wt 242.0 lb

## 2021-08-18 DIAGNOSIS — I493 Ventricular premature depolarization: Secondary | ICD-10-CM

## 2021-08-18 MED ORDER — METOPROLOL TARTRATE 50 MG PO TABS: 50.0000 mg | ORAL_TABLET | Freq: Two times a day (BID) | ORAL | 3 refills | Status: DC

## 2021-08-18 NOTE — Telephone Encounter (Signed)
Called and unable to leave VM.  If pt calls back, we need her to bring her CPAP and power cord to her appt for a manual download.

## 2021-08-18 NOTE — Progress Notes (Signed)
HPI Ms. Christy Holland returns today for followup of her PVC's. She is a pleasant 50 yo woman with HTN with frequent and medically refractory PVC's who underwent catheter ablation several weeks ago. The patient has done well in the interim. She has rare palpitations in the interim. She has been on metoprolol due to a h/o HTN.  Allergies  Allergen Reactions   Sulfasalazine Hives   Sulfa Antibiotics      Current Outpatient Medications  Medication Sig Dispense Refill   Multiple Vitamin (MULTIVITAMIN) capsule Take 1 capsule by mouth daily.     Vitamin D, Ergocalciferol, (DRISDOL) 1.25 MG (50000 UT) CAPS capsule Take 50,000 Units by mouth every 7 (seven) days.     metoprolol tartrate (LOPRESSOR) 50 MG tablet Take 1 tablet (50 mg total) by mouth 2 (two) times daily. 180 tablet 3   No current facility-administered medications for this visit.     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/2017   Rhinitis, chronic 08/21/2017   Sinusitis 02/13/2018   Snoring 08/21/2017    ROS:   All systems reviewed and negative except as noted in the HPI.   Past Surgical History:  Procedure Laterality Date   PVC ABLATION N/A 06/18/2021   Procedure: PVC ABLATION;  Surgeon: Marinus Maw, MD;  Location: [facility];  Service: Cardiovascular;  Laterality: N/A;     Family History  Problem Relation Age of Onset   Hypertension Mother    Hyperlipidemia Mother    Diabetes Mother    Hypertension Father    Hyperlipidemia Father    Diabetes Father    Heart attack Father 52   Stroke Father      Social History   Socioeconomic History   Marital status: Married    Spouse name: Not on file   Number of children: 3   Years of education: Not on file   Highest education level: Not on file  Occupational History   Not on file  Tobacco Use   Smoking status:  Never   Smokeless tobacco: Never  Substance and Sexual Activity   Alcohol use: No    Alcohol/week: 0.0 standard drinks   Drug use: No   Sexual activity: Yes    Birth control/protection: Condom  Other Topics Concern   Not on file  Social History Narrative   ** Merged History Encounter **       Social Determinants of Health   Financial Resource Strain: Not on file  Food Insecurity: Not on file  Transportation Needs: Not on file  Physical Activity: Not on file  Stress: Not on file  Social Connections: Not on file  Intimate Partner Violence: Not on file     BP 124/80   Pulse 93   Ht 5\' 7"  (1.702 m)   Wt 242 lb (109.8 kg)   SpO2 98%   BMI 37.90 kg/m   Physical Exam:  Well appearing NAD HEENT: Unremarkable Neck:  No JVD, no thyromegally Lymphatics:  No adenopathy Back:  No CVA tenderness Lungs:  Clear with no wheezes HEART:  Regular rate rhythm, no murmurs, no rubs, no clicks Abd:  soft, positive bowel sounds, no organomegally, no rebound, no guarding Ext:  2 plus pulses, no edema, no cyanosis, no clubbing Skin:  No rashes no nodules Neuro:  CN II through XII intact, motor grossly intact  EKG - NSR   Assess/Plan:  PVC's - she appears to be doing well. Her ECG looks good. She has minimal if any palpitations. She will continue a beta blocker for now. HTN - her bp is good on metoprolol.  Obesity - I encouraged her to lose weight.  Christy Gowda Javaughn Opdahl,MD

## 2021-08-18 NOTE — Patient Instructions (Signed)

## 2021-08-19 ENCOUNTER — Ambulatory Visit: Payer: 59 | Admitting: Family Medicine

## 2021-08-19 ENCOUNTER — Encounter: Payer: Self-pay | Admitting: Family Medicine

## 2021-08-19 DIAGNOSIS — G4700 Insomnia, unspecified: Secondary | ICD-10-CM

## 2021-08-19 DIAGNOSIS — G4733 Obstructive sleep apnea (adult) (pediatric): Secondary | ICD-10-CM

## 2021-09-28 DIAGNOSIS — L700 Acne vulgaris: Secondary | ICD-10-CM | POA: Insufficient documentation

## 2021-10-19 DIAGNOSIS — N6320 Unspecified lump in the left breast, unspecified quadrant: Secondary | ICD-10-CM | POA: Insufficient documentation

## 2021-11-11 NOTE — Progress Notes (Signed)
PATIENT: Christy Holland DOB: October 07, 1971  REASON FOR VISIT: follow up HISTORY FROM: patient  Chief Complaint  Patient presents with   Obstructive Sleep Apnea    Rm 1, alone. Here for CPAP f/u. Pt reports doing well on CPAP. Pt is needing supplies ordered.      HISTORY OF PRESENT ILLNESS: 11/17/21 ALL: Christy Holland returns for follow up for OSA on CPAP. Her download below demonstrates that she is wearing her CPAP for 25 of the 30 days for a compliance of 83.3%. She wears the machine for >4 hours 80% of the time. Patient reports that she continues to get some sinus congestion and believes it's from the CPAP. She reports that she is cleaning the machine regularly.  She endorses that she continues to feel that she in not well rested or sleeping throughout the night. She was previously taking trazodone, she found this helpful for her. Will consider restarting this. Referral was placed to psychiatry for concerns of psychophysiologic insomnia. She has not scheduled an appt.   Patient states that she has been established with a [facility].    02/11/2021 ALL: Christy Holland returns for follow up for OSA on CPAP and insomnia. She continues fairly regular use of CPAP therapy. She also continues trazodone 150mg  daily. She continued to have difficulty sleeping and was encouraged to establish care with PCP and to consider psychiatry referral for management of anxiety. She has not yet scheduled an appt with PCP. She tried to get an appt with psychiatry but was having a hard time finding someone to take her insurance. She feels that she is doing ok. Still having difficulty with sleep. No concerns with CPAP. Trazodone is helping.   Compliance report dated 01/11/2021-02/10/2021 revelas that she used CPAP 25/30 days. 4 hour compliance was 77.4%. Residual AHI was 2.4/hr on 5-11cmH20. No significant leak noted.   11/12/2020 ALL:  Christy Holland returns for follow up on OSA and insomnia. We increased trazodone  to 150mg  at bedtime in 07/2020. It helps to get her to sleep but continuing to have trouble staying asleep. She usually has to use the bathroom and then has difficulty getting back to sleep. She gets 3-4 ours each night. She feels that her mind starts working and she can't return to sleep. She continues to care for her mother who is ill. She travels to [facility] [address] [facility] often to take care of her. Sometimes she does not take her CPAP machine with her. She denies any concerns with CPAP machine or supplies. No difficulty with tolerability of therapy.   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 Cardiology workup showed 30% PVC burden but no other significant findings. She was started on flecainide and metoprolol but discontinued these medications several months ago. She is uncertain why, She does not have PCP. Last labs in 2018.   Compliance report dated 10/03/2020 through 11/03/2020 reveals that she used CPAP 20 one of the past 30 days for compliance of 65.6%.  She used CPAP greater than 4 hours 59.4%.  Residual AHI was 2.7 on 5 to 11 cm of water.  There was no significant leak noted.  08/10/2020 ALL: Christy Holland is a 51 y.o. female here today for follow up for OSA on CPAP. She has continues intermittent use of CPAP therapy. She continues trazodone 100mg  for insomnia. She feels that she is able to go to sleep but unable to stay asleep. She usually has to use the restroom and  is unable to get back to sleep. She usually takes trazodone around 9:30, goes to bed around 10pm. Sometimes she may fall asleep watching TV, sometimes not. She usually wakes around 2am and is awake for about an hour.  Sometimes she may drift back off to sleep around 3 AM and then wakes at 5:30 AM.  She does not feel anxious or depressed throughout the day.  She admits to inconsistent sleep patterns as her mother is ill.  She has been traveling to [facility] to help take care of her.  She has not been using CPAP when  traveling.  Compliance report dated 07/06/2020 through 08/04/2020 reveals that she used CPAP 19 of the past 30 days for compliance of 63%.  She is CPAP greater than 4 hours 53% of the time.  Residual AHI was 2.6 on 5 to 11 cm of water.  No significant leak noted.  HISTORY: (copied from my note on 02/05/2020)  Christy Holland is a 51 y.o. female here today for follow up.  She is doing well with CPAP therapy.  She is using her machine every night.  She continues to have difficulty with insomnia.  We started her on trazodone 50 mg at bedtime.  This has been helpful in helping her get to sleep but she continues to have trouble staying asleep.  She is interested in increasing the dose.  She has tried Benadryl and melatonin in the past with no relief.  She does not feel that anxiety or depression are contributing but is followed closely by her primary care provider.   Compliance report dated 01/06/2020 through 02/04/2020 reveals that she used CPAP 30 of the past 30 days for compliance of 100%.  She used CPAP greater than 4 hours 73.3% of the time.  Average usage was 4 hours and 59 minutes.  Residual AHI was 3.8 on 5 to 11 cm of water pressure and an EPR of 3.  There was no significant leak noted.   HISTORY: (copied from my note on 11/07/2019)   Christy Holland is a 51 y.o. female here today for follow up for OSA on CPAP.  She reports that she is doing fairly well on CPAP therapy.  She is working towards nightly use and meeting the 4-hour recommendation.  She denies difficulty with CPAP machine.  She does continue to note insomnia.  She has tried over-the-counter options including melatonin and Benadryl with no success.   Compliance report dated 10/08/2019 through 11/06/2019 reveals that she used CPAP 28 of the last 30 days for compliance of 93.3%.  She used CPAP greater than 4 hours 83% of time.  Average usage was 5 hours and 27 minutes.  Residual AHI was 3.8 on 5 to 11 cm of water and an EPR of 3.  There was no  significant leak noted.   REVIEW OF SYSTEMS: Out of a complete 14 system review of symptoms, the patient complains only of the following symptoms, insomnia and all other reviewed systems are negative. Sinus congestions Insomnia  ESS: 14   ALLERGIES: Allergies  Allergen Reactions   Sulfasalazine Hives   Sulfa Antibiotics     HOME MEDICATIONS: Outpatient Medications Prior to Visit  Medication Sig Dispense Refill   amoxicillin-clavulanate (AUGMENTIN) 875-125 MG tablet Take 1 tablet by mouth 2 (two) times daily. 14 tablet 0   clonazePAM (KLONOPIN) 1 MG tablet Take 1 tablet (1 mg total) by mouth 2 (two) times daily. 30 tablet 0   metFORMIN (GLUCOPHAGE-XR) 500 MG 24 hr  tablet Take 500 mg by mouth 2 (two) times daily.     Multiple Vitamin (MULTIVITAMIN) capsule Take 1 capsule by mouth daily.     Vitamin D, Ergocalciferol, (DRISDOL) 1.25 MG (50000 UT) CAPS capsule Take 50,000 Units by mouth every 7 (seven) days.     metoprolol tartrate (LOPRESSOR) 50 MG tablet Take 1 tablet (50 mg total) by mouth 2 (two) times daily. 180 tablet 3   No facility-administered medications prior to visit.    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   Rhinitis, chronic 08/21/2017   Sinusitis 02/13/2018   Snoring 08/21/2017    PAST SURGICAL HISTORY: Past Surgical History:  Procedure Laterality Date   PVC ABLATION N/A 06/18/2021   Procedure: PVC ABLATION;  Surgeon: Evans Lance, MD;  Location: [facility];  Service: Cardiovascular;  Laterality: N/A;    FAMILY HISTORY: Family History  Problem Relation Age of Onset   Hypertension Mother    Hyperlipidemia Mother    Diabetes Mother    Hypertension Father    Hyperlipidemia Father    Diabetes Father    Heart attack Father 63   Stroke Father     SOCIAL HISTORY: Social History    Socioeconomic History   Marital status: Married    Spouse name: Not on file   Number of children: 3   Years of education: Not on file   Highest education level: Not on file  Occupational History   Not on file  Tobacco Use   Smoking status: Never   Smokeless tobacco: Never  Vaping Use   Vaping Use: Never used  Substance and Sexual Activity   Alcohol use: No    Alcohol/week: 0.0 standard drinks   Drug use: No   Sexual activity: Yes    Birth control/protection: Condom  Other Topics Concern   Not on file  Social History Narrative   ** Merged History Encounter **       Social Determinants of Health   Financial Resource Strain: Not on file  Food Insecurity: Not on file  Transportation Needs: Not on file  Physical Activity: Not on file  Stress: Not on file  Social Connections: Not on file  Intimate Partner Violence: Not on file     PHYSICAL EXAM  Vitals:   11/17/21 0804  BP: 122/80  Pulse: 93  SpO2: 97%  Weight: 110.9 kg  Height: 5\' 7"  (1.702 m)    Body mass index is 38.29 kg/m.  Generalized: Well developed, in no acute distress  Cardiology: normal rate and rhythm, PVC's, no murmur noted Respiratory: clear to auscultation bilaterally  Neurological examination  Mentation: Alert oriented to time, place, history taking. Follows all commands speech and language fluent Cranial nerve II-XII: Pupils were equal round reactive to light. Extraocular movements were full, visual field were full  Motor: The motor testing reveals 5 over 5 strength of all 4 extremities. Good symmetric motor tone is noted throughout.  Gait and station: Gait is normal.    DIAGNOSTIC DATA (LABS, IMAGING, TESTING) - I reviewed patient records, labs, notes, testing and imaging myself where available.  No flowsheet data found.   Lab Results  Component Value Date   WBC 5.4 06/10/2021   HGB 13.0 06/10/2021   HCT 38.8 06/10/2021   MCV 89 06/10/2021   PLT 196 06/10/2021  Component  Value Date/Time   NA 140 06/10/2021 1248   K 4.2 06/10/2021 1248   CL 102 06/10/2021 1248   CO2 29 06/10/2021 1248   GLUCOSE 101 (H) 06/10/2021 1248   BUN 7 06/10/2021 1248   CREATININE 0.77 06/10/2021 1248   CALCIUM 9.4 06/10/2021 1248   PROT 6.9 07/31/2017 1712   ALBUMIN 4.2 07/31/2017 1712   AST 26 07/31/2017 1712   ALT 11 07/31/2017 1712   ALKPHOS 63 07/31/2017 1712   BILITOT <0.2 07/31/2017 1712   GFRNONAA 90 07/31/2017 1712   GFRAA 104 07/31/2017 1712   No results found for: CHOL, HDL, LDLCALC, LDLDIRECT, TRIG, CHOLHDL No results found for: HGBA1C No results found for: VITAMINB12 Lab Results  Component Value Date   TSH 1.450 07/31/2017     ASSESSMENT AND PLAN 51 y.o. year old female  has a past medical history of Chronic maxillary sinusitis (08/21/2017), Frequent PVCs, Holter monitor, abnormal (08/22/2019), Hypertrophy of tonsil and adenoid (08/21/2017), Rhinitis, chronic (08/21/2017), Sinusitis (02/13/2018), and Snoring (08/21/2017). here with     ICD-10-CM   1. OSA on CPAP  G47.33 For home use only DME continuous positive airway pressure (CPAP)   Z99.89     2. Insomnia, unspecified type  G47.00       Christy Holland is doing much better with compliance. Current download shows optimal compliance with daily and 4 hour use. We will update supply orders as indicated. Risks of untreated sleep apnea review and education materials provided.  She continues to struggle with insomnia.  She does feel that trazodone was helping. We will restart trazodone 150 mg every night about 30 to 45 minutes before bedtime. We have reviewed sleep hygiene and I have encouraged her to work on these at home. Encouraged her to reach out to psychiatry to follow up on the referral. She was provided contact information for local psychiatry providers. Healthy lifestyle habits encouraged. She will follow up in 12 months, sooner if needed. She verbalizes understanding and agreement with this plan.     Orders Placed This Encounter  Procedures   For home use only DME continuous positive airway pressure (CPAP)    Order Specific Question:   Length of Need    Answer:   12 Months    Order Specific Question:   Patient has OSA or probable OSA    Answer:   Yes    Order Specific Question:   Settings    Answer:   Autotitration    Order Specific Question:   CPAP supplies needed    Answer:   Mask, headgear, cushions, filters, heated tubing and water chamber     Meds ordered this encounter  Medications   traZODone (DESYREL) 150 MG tablet    Sig: Take 1 tablet (150 mg total) by mouth at bedtime.    Dispense:  90 tablet    Refill:  3       Yunior Jain, FNP-C 11/17/2021, 8:44 AM [facility] [address] [place], [place] [address] [phone]

## 2021-11-11 NOTE — Progress Notes (Signed)
HPI F never smoker followed for Insomnia, Minimal OSA( 6.6/ hr)/ Snoring,  complicated by Dyspnea on exertion, PVCs/ Ablation, Chronic Rhinitis, Chronic Maxillary /sinusitis, Tonsil Hypertrophy, Obesity,  HST ([facility]) 05/08/2019- AHI 6.6/ hr, body weight 218 lbs  ===============================================================   07/02/21- 50 yoF never smoker followed for Insomnia, Minimal OSA( 6.6/ hr)/ Snoring,  complicated by Dyspnea on exertion, PVCs/ Ablation, Chronic Rhinitis, Chronic Maxillary /sinusitis, Tonsil Hypertrophy, Obesity,  -Clonazepam 0.5 mg 1-2 CPAP  auto 5-11/ [employer]  [facility] [employer] - compliance 83%, AHI 2.6/ hr Had cardiology ablation for PVCs Body weight today-233 lbs Covid vax-3  Would like tro try higher dose of clonazepam Discussed flu and pneumonia vaccines> both today  11/12/21- 50 yoF never smoker followed for Insomnia, Minimal OSA( 6.6/ hr)/ Snoring,  complicated by Dyspnea on exertion, PVCs/ Ablation, Chronic Rhinitis, Chronic Maxillary /sinusitis, Tonsil Hypertrophy, Obesity,  -Clonazepam 0.5 mg 1-2 CPAP  auto 5-11/ [employer]  [facility] [employer] - compliance N/A Had cardiology ablation for PVCs Body weight today-247 lbs Covid vax-3 Phizer Flu vax-had -----Patient states that she is still not getting a restful sleep at night. She is having sinus issues right now. Head congestion and pressure, clearing throat,teeth hurting,  productive cough with yellow/green sputum. She states that she is using the CPAP but can't feel the air or anything from the congestion.  Discussed management of sinus infection and will send Augmentin. Still dealing with insomnia and says up to 2 mg of clonazepam still not enough.  Then asks about a stimulant such as Adderall for excessive daytime sleepiness.  Pointed out emphasis on getting enough nighttime sleep first and occasional nap if needed.  Once sinus infection is resolved and she can get back to regular use of CPAP,  we can reassess and consider alternative to clonazepam.  For now I am refilling that for 1 month, using 1 mg tabs.  Need to make sure long half-life of clonazepam is not contributing to daytime sleepiness.  I suggested she try an occasional caffeine tablet since she is not a coffee drinker.  She will ask her cardiologist about use of any stimulants with her history of ventricular irritability.  ROS-see HPI   + = positive Constitutional:    weight loss, night sweats, fevers, chills, f+atigue, lassitude. HEENT:    +headaches, difficulty swallowing, tooth/dental problems, sore throat,       sneezing, itching, ear ache, +nasal congestion, post nasal drip, snoring CV:    chest pain, orthopnea, PND, swelling in lower extremities, anasarca,                                   dizziness, palpitations Resp:   shortness of breath with exertion or at rest.                productive cough,   non-productive cough, coughing up of blood.              change in color of mucus.  wheezing.   Skin:    rash or lesions. GI:  No-   heartburn, indigestion, abdominal pain, nausea, vomiting, diarrhea,                 change in bowel habits, loss of appetite GU: dysuria, change in color of urine, no urgency or frequency.   flank pain. MS:   joint pain, stiffness, decreased range of motion, back pain. Neuro-  nothing unusual Psych:  change in mood or affect.  depression or anxiety.   memory loss.  OBJ- Physical Exam General- Alert, Oriented, Affect-appropriate, Distress- none acute, + obese Skin- rash-none, lesions- none, excoriation- none Lymphadenopathy- none Head- atraumatic            Eyes- Gross vision intact, PERRLA, conjunctivae and secretions clear            Ears- Hearing, canals-normal            Nose- +turbinate edema, no-Septal dev, mucus, polyps, erosion, perforation             Throat- Mallampati II , mucosa clear , drainage- none, tonsils- atrophic, + teeth Neck- flexible , trachea midline, no  stridor , thyroid nl, carotid no bruit Chest - symmetrical excursion , unlabored           Heart/CV- RRR , no murmur , no gallop  , no rub, nl s1 s2                           - JVD- none , edema- none, stasis changes- none, varices- none           Lung- clear to P&A, wheeze- none, cough- none , dullness-none, rub- none           Chest wall-  Abd-  Br/ Gen/ Rectal- Not done, not indicated Extrem- cyanosis- none, clubbing, none, atrophy- none, strength- nl Neuro- grossly intact to observation

## 2021-11-11 NOTE — Patient Instructions (Addendum)
Please continue using your CPAP regularly. While your insurance requires that you use CPAP at least 4 hours each night on 70% of the nights, I recommend, that you not skip any nights and use it throughout the night if you can. Getting used to CPAP and staying with the treatment long term does take time and patience and discipline. Untreated obstructive sleep apnea when it is moderate to severe can have an adverse impact on cardiovascular health and raise her risk for heart disease, arrhythmias, hypertension, congestive heart failure, stroke and diabetes. Untreated obstructive sleep apnea causes sleep disruption, nonrestorative sleep, and sleep deprivation. This can have an impact on your day to day functioning and cause daytime sleepiness and impairment of cognitive function, memory loss, mood disturbance, and problems focussing. Using CPAP regularly can improve these symptoms.   Please reach out to the following regarding psychiatry referral. [phone]  [facility]. Please follow up with PCP moving forward.   Follow up in 1 year

## 2021-11-12 ENCOUNTER — Ambulatory Visit (INDEPENDENT_AMBULATORY_CARE_PROVIDER_SITE_OTHER): Payer: Medicaid Other | Admitting: Internal Medicine

## 2021-11-12 ENCOUNTER — Other Ambulatory Visit: Payer: Self-pay

## 2021-11-12 ENCOUNTER — Encounter: Payer: Self-pay | Admitting: Internal Medicine

## 2021-11-12 DIAGNOSIS — J0100 Acute maxillary sinusitis, unspecified: Secondary | ICD-10-CM

## 2021-11-12 DIAGNOSIS — F5101 Primary insomnia: Secondary | ICD-10-CM | POA: Diagnosis not present

## 2021-11-12 DIAGNOSIS — G4700 Insomnia, unspecified: Secondary | ICD-10-CM | POA: Insufficient documentation

## 2021-11-12 DIAGNOSIS — G4733 Obstructive sleep apnea (adult) (pediatric): Secondary | ICD-10-CM

## 2021-11-12 MED ORDER — AMOXICILLIN-POT CLAVULANATE 875-125 MG PO TABS: 1.0000 | ORAL_TABLET | Freq: Two times a day (BID) | ORAL | 0 refills | Status: DC

## 2021-11-12 MED ORDER — CLONAZEPAM 1 MG PO TABS: 1.0000 mg | ORAL_TABLET | Freq: Two times a day (BID) | ORAL | 0 refills | Status: DC

## 2021-11-12 NOTE — Assessment & Plan Note (Signed)
She complains that 2 mg of clonazepam is insufficient to consolidate sleep at night but then complains of daytime tiredness.  Need to make sure she is getting enough sleep at night and occasional naps if appropriate and that she is not dealing with long half-life issues of clonazepam. Plan-sleep habits discussed.  Consider caffeine tablet occasionally if okay with cardiology.

## 2021-11-12 NOTE — Assessment & Plan Note (Signed)
Acute sinusitis is interfering with CPAP use.  We will try to get that resolved quickly. Plan-continue auto 5-11

## 2021-11-12 NOTE — Patient Instructions (Addendum)
Script sent for augmentin antibiotic to treat sinus infection  Try to get back to using your CPAP regularly  Please call if we can help  Nap if you need to.  Try otc caffeine tablet- NoDoz or etc.   1/2 or 1 tab occasionally if you remain sleepy.   Script sent changing clonazepam to 1 mg tabs.  Try this for a month, then we will consider if a different med is needed to help you sleep.

## 2021-11-12 NOTE — Assessment & Plan Note (Signed)
Acute bilateral maxillary sinusitis Plan-Augmentin

## 2021-11-17 ENCOUNTER — Encounter: Payer: Self-pay | Admitting: Family Medicine

## 2021-11-17 ENCOUNTER — Other Ambulatory Visit: Payer: Self-pay

## 2021-11-17 ENCOUNTER — Ambulatory Visit (INDEPENDENT_AMBULATORY_CARE_PROVIDER_SITE_OTHER): Payer: BC Managed Care – PPO | Admitting: Family Medicine

## 2021-11-17 VITALS — BP 122/80 | HR 93 | Ht 67.0 in | Wt 244.5 lb

## 2021-11-17 DIAGNOSIS — Z9989 Dependence on other enabling machines and devices: Secondary | ICD-10-CM | POA: Diagnosis not present

## 2021-11-17 DIAGNOSIS — G4700 Insomnia, unspecified: Secondary | ICD-10-CM

## 2021-11-17 DIAGNOSIS — G4733 Obstructive sleep apnea (adult) (pediatric): Secondary | ICD-10-CM | POA: Diagnosis not present

## 2021-11-17 MED ORDER — TRAZODONE HCL 150 MG PO TABS: 150.0000 mg | ORAL_TABLET | Freq: Every day | ORAL | 3 refills | Status: DC

## 2021-11-17 NOTE — Progress Notes (Addendum)
CM sent to [facility]  Barry Dienes, Sharin Grave, Kaylinn Dedic, CMA; Dimas Millin Hey Just letting you know this patient has a balance in collections that will need to be taken care of before we can complete the supply order. I have sent patient a text on this.

## 2021-12-20 ENCOUNTER — Ambulatory Visit (INDEPENDENT_AMBULATORY_CARE_PROVIDER_SITE_OTHER): Payer: BC Managed Care – PPO | Admitting: Podiatry

## 2021-12-20 ENCOUNTER — Other Ambulatory Visit: Payer: Self-pay

## 2021-12-20 ENCOUNTER — Ambulatory Visit (INDEPENDENT_AMBULATORY_CARE_PROVIDER_SITE_OTHER): Payer: BC Managed Care – PPO

## 2021-12-20 DIAGNOSIS — M722 Plantar fascial fibromatosis: Secondary | ICD-10-CM

## 2021-12-20 DIAGNOSIS — M76829 Posterior tibial tendinitis, unspecified leg: Secondary | ICD-10-CM | POA: Diagnosis not present

## 2021-12-20 DIAGNOSIS — M79672 Pain in left foot: Secondary | ICD-10-CM | POA: Diagnosis not present

## 2021-12-20 DIAGNOSIS — M79671 Pain in right foot: Secondary | ICD-10-CM | POA: Diagnosis not present

## 2021-12-20 DIAGNOSIS — M21612 Bunion of left foot: Secondary | ICD-10-CM

## 2021-12-20 NOTE — Patient Instructions (Signed)
You an use "voltaren gel"  For instructions on how to put on your Plantar Fascial Brace, please visit [email or web address]   Plantar Fasciitis (Heel Spur Syndrome) with Rehab The plantar fascia is a fibrous, ligament-like, soft-tissue structure that spans the bottom of the foot. Plantar fasciitis is a condition that causes pain in the foot due to inflammation of the tissue. SYMPTOMS  Pain and tenderness on the underneath side of the foot. Pain that worsens with standing or walking. CAUSES  Plantar fasciitis is caused by irritation and injury to the plantar fascia on the underneath side of the foot. Common mechanisms of injury include: Direct trauma to bottom of the foot. Damage to a small nerve that runs under the foot where the main fascia attaches to the heel bone. Stress placed on the plantar fascia due to bone spurs. RISK INCREASES WITH:  Activities that place stress on the plantar fascia (running, jumping, pivoting, or cutting). Poor strength and flexibility. Improperly fitted shoes. Tight calf muscles. Flat feet. Failure to warm-up properly before activity. Obesity. PREVENTION Warm up and stretch properly before activity. Allow for adequate recovery between workouts. Maintain physical fitness: Strength, flexibility, and endurance. Cardiovascular fitness. Maintain a health body weight. Avoid stress on the plantar fascia. Wear properly fitted shoes, including arch supports for individuals who have flat feet.  PROGNOSIS  If treated properly, then the symptoms of plantar fasciitis usually resolve without surgery. However, occasionally surgery is necessary.  RELATED COMPLICATIONS  Recurrent symptoms that may result in a chronic condition. Problems of the lower back that are caused by compensating for the injury, such as limping. Pain or weakness of the foot during push-off following surgery. Chronic inflammation, scarring, and partial or complete fascia tear, occurring  more often from repeated injections.  TREATMENT  Treatment initially involves the use of ice and medication to help reduce pain and inflammation. The use of strengthening and stretching exercises may help reduce pain with activity, especially stretches of the Achilles tendon. These exercises may be performed at home or with a therapist. Your caregiver may recommend that you use heel cups of arch supports to help reduce stress on the plantar fascia. Occasionally, corticosteroid injections are given to reduce inflammation. If symptoms persist for greater than 6 months despite non-surgical (conservative), then surgery may be recommended.   MEDICATION  If pain medication is necessary, then nonsteroidal anti-inflammatory medications, such as aspirin and ibuprofen, or other minor pain relievers, such as acetaminophen, are often recommended. Do not take pain medication within 7 days before surgery. Prescription pain relievers may be given if deemed necessary by your caregiver. Use only as directed and only as much as you need. Corticosteroid injections may be given by your caregiver. These injections should be reserved for the most serious cases, because they may only be given a certain number of times.  HEAT AND COLD Cold treatment (icing) relieves pain and reduces inflammation. Cold treatment should be applied for 10 to 15 minutes every 2 to 3 hours for inflammation and pain and immediately after any activity that aggravates your symptoms. Use ice packs or massage the area with a piece of ice (ice massage). Heat treatment may be used prior to performing the stretching and strengthening activities prescribed by your caregiver, physical therapist, or athletic trainer. Use a heat pack or soak the injury in warm water.  SEEK IMMEDIATE MEDICAL CARE IF: Treatment seems to offer no benefit, or the condition worsens. Any medications produce adverse side effects.  EXERCISES- RANGE OF MOTION (ROM)  AND  STRETCHING EXERCISES - Plantar Fasciitis (Heel Spur Syndrome) These exercises may help you when beginning to rehabilitate your injury. Your symptoms may resolve with or without further involvement from your physician, physical therapist or athletic trainer. While completing these exercises, remember:  Restoring tissue flexibility helps normal motion to return to the joints. This allows healthier, less painful movement and activity. An effective stretch should be held for at least 30 seconds. A stretch should never be painful. You should only feel a gentle lengthening or release in the stretched tissue.  RANGE OF MOTION - Toe Extension, Flexion Sit with your right / left leg crossed over your opposite knee. Grasp your toes and gently pull them back toward the top of your foot. You should feel a stretch on the bottom of your toes and/or foot. Hold this stretch for 10 seconds. Now, gently pull your toes toward the bottom of your foot. You should feel a stretch on the top of your toes and or foot. Hold this stretch for 10 seconds. Repeat  times. Complete this stretch 3 times per day.   RANGE OF MOTION - Ankle Dorsiflexion, Active Assisted Remove shoes and sit on a chair that is preferably not on a carpeted surface. Place right / left foot under knee. Extend your opposite leg for support. Keeping your heel down, slide your right / left foot back toward the chair until you feel a stretch at your ankle or calf. If you do not feel a stretch, slide your bottom forward to the edge of the chair, while still keeping your heel down. Hold this stretch for 10 seconds. Repeat 3 times. Complete this stretch 2 times per day.   STRETCH  Gastroc, Standing Place hands on wall. Extend right / left leg, keeping the front knee somewhat bent. Slightly point your toes inward on your back foot. Keeping your right / left heel on the floor and your knee straight, shift your weight toward the wall, not allowing your back  to arch. You should feel a gentle stretch in the right / left calf. Hold this position for 10 seconds. Repeat 3 times. Complete this stretch 2 times per day.  STRETCH  Soleus, Standing Place hands on wall. Extend right / left leg, keeping the other knee somewhat bent. Slightly point your toes inward on your back foot. Keep your right / left heel on the floor, bend your back knee, and slightly shift your weight over the back leg so that you feel a gentle stretch deep in your back calf. Hold this position for 10 seconds. Repeat 3 times. Complete this stretch 2 times per day.  STRETCH  Gastrocsoleus, Standing  Note: This exercise can place a lot of stress on your foot and ankle. Please complete this exercise only if specifically instructed by your caregiver.  Place the ball of your right / left foot on a step, keeping your other foot firmly on the same step. Hold on to the wall or a rail for balance. Slowly lift your other foot, allowing your body weight to press your heel down over the edge of the step. You should feel a stretch in your right / left calf. Hold this position for 10 seconds. Repeat this exercise with a slight bend in your right / left knee. Repeat 3 times. Complete this stretch 2 times per day.   STRENGTHENING EXERCISES - Plantar Fasciitis (Heel Spur Syndrome)  These exercises may help you when beginning to rehabilitate your injury. They may resolve your  symptoms with or without further involvement from your physician, physical therapist or athletic trainer. While completing these exercises, remember:  Muscles can gain both the endurance and the strength needed for everyday activities through controlled exercises. Complete these exercises as instructed by your physician, physical therapist or athletic trainer. Progress the resistance and repetitions only as guided.  STRENGTH - Towel Curls Sit in a chair positioned on a non-carpeted surface. Place your foot on a towel,  keeping your heel on the floor. Pull the towel toward your heel by only curling your toes. Keep your heel on the floor. Repeat 3 times. Complete this exercise 2 times per day.  STRENGTH - Ankle Inversion Secure one end of a rubber exercise band/tubing to a fixed object (table, pole). Loop the other end around your foot just before your toes. Place your fists between your knees. This will focus your strengthening at your ankle. Slowly, pull your big toe up and in, making sure the band/tubing is positioned to resist the entire motion. Hold this position for 10 seconds. Have your muscles resist the band/tubing as it slowly pulls your foot back to the starting position. Repeat 3 times. Complete this exercises 2 times per day.  Document Released: 10/10/2005 Document Revised: 01/02/2012 Document Reviewed: 01/22/2009 [facility] Patient Information 2014 [place], [place].

## 2021-12-26 NOTE — Progress Notes (Signed)
Subjective:   Patient ID: Christy Holland, female   DOB: 51 y.o.   MRN: [id number]   HPI 51 year old female presents the office today for concerns of bilateral bunion, heel pain in the left side worse than the right.  This started about 1 year ago and she describes a throbbing sensation.  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  She does wear Brooks shoes and have padding inside of her shoes.  She states the arch and heel hurt when standing.  Occasionally she will get some pain to the ankle on the medial aspect.  No recent injuries.  No other concerns.   Review of Systems  All other systems reviewed and are negative.  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/2017   Rhinitis, chronic 08/21/2017   Sinusitis 02/13/2018   Snoring 08/21/2017    Past Surgical History:  Procedure Laterality Date   PVC ABLATION N/A 06/18/2021   Procedure: PVC ABLATION;  Surgeon: Marinus Maw, MD;  Location: [facility];  Service: Cardiovascular;  Laterality: N/A;     Current Outpatient Medications:    amoxicillin-clavulanate (AUGMENTIN) 875-125 MG tablet, Take 1 tablet by mouth 2 (two) times daily., Disp: 14 tablet, Rfl: 0   clonazePAM (KLONOPIN) 1 MG tablet, Take 1 tablet (1 mg total) by mouth 2 (two) times daily., Disp: 30 tablet, Rfl: 0   metFORMIN (GLUCOPHAGE-XR) 500 MG 24 hr tablet, Take 500 mg by mouth 2 (two) times daily., Disp: , Rfl:    metoprolol tartrate (LOPRESSOR) 50 MG tablet, Take 1 tablet (50 mg total) by mouth 2 (two) times daily., Disp: 180 tablet, Rfl: 3   Multiple Vitamin (MULTIVITAMIN) capsule, Take 1 capsule by mouth daily., Disp: , Rfl:    traZODone (DESYREL) 150 MG tablet, Take 1 tablet (150 mg total) by mouth at bedtime., Disp: 90 tablet, Rfl: 3   Vitamin D, Ergocalciferol, (DRISDOL) 1.25 MG (50000 UT) CAPS  capsule, Take 50,000 Units by mouth every 7 (seven) days., Disp: , Rfl:   Allergies  Allergen Reactions   Sulfasalazine Hives   Sulfa Antibiotics           Objective:  Physical Exam  General: AAO x3, NAD  Dermatological: Skin is warm, dry and supple bilateral.  There are no open sores, no preulcerative lesions, no rash or signs of infection present.  Vascular: Dorsalis Pedis artery and Posterior Tibial artery pedal pulses are 2/4 bilateral with immedate capillary fill time.There is no pain with calf compression, swelling, warmth, erythema.   Neruologic: Grossly intact via light touch bilateral. Negative tinel sign.   Musculoskeletal: There is a moderate bunion present on the left side.  Tenderness palpation directly on the bunion Holland.  There is tenderness along the plantar medial aspect of the calcaneus at the insertion of plantar fascia and she gets discomfort in the arch of the foot as well.  There is no Holland of pinpoint tenderness.  There is mild discomfort in the course of the posterior tibial, flexor tendon inferior to the medial malleolus towards the navicular tuberosity.  Clinically the tendon appears to be intact..  Muscular strength 5/5 in all groups tested bilateral.  Minimal discomfort with distal portion of the Achilles tendon.  Tendon appears to be intact.  Flatfoot present.  Gait: Unassisted, Nonantalgic.  Assessment:   Plantar fasciitis, posterior tibial tendinitis, bunion     Plan:  -Treatment options discussed including all alternatives, risks, and complications -Etiology of symptoms were discussed -X-rays were obtained and reviewed with the patient.  Bunions present on the left side.  There is decreased calcaneal inclination angle. -Discussed steroid injections today however recommend starting with anti-inflammatories.  -I dispensed plantar fascial braces x2.  Discussed stretching, icing exercises to be performed daily.  Discussed shoe modifications and  good arch support as well. -For the bunion discussed offloading as well as shoe modifications.  Consider surgical invention in the future if needed.  Vivi Barrack DPM

## 2022-01-18 DIAGNOSIS — Z9889 Other specified postprocedural states: Secondary | ICD-10-CM | POA: Insufficient documentation

## 2022-01-21 ENCOUNTER — Ambulatory Visit (INDEPENDENT_AMBULATORY_CARE_PROVIDER_SITE_OTHER): Payer: BC Managed Care – PPO

## 2022-01-21 DIAGNOSIS — M76829 Posterior tibial tendinitis, unspecified leg: Secondary | ICD-10-CM

## 2022-01-21 DIAGNOSIS — M722 Plantar fascial fibromatosis: Secondary | ICD-10-CM

## 2022-01-21 DIAGNOSIS — M21612 Bunion of left foot: Secondary | ICD-10-CM

## 2022-01-21 NOTE — Progress Notes (Signed)
SITUATION ?Reason for Consult: Evaluation for Bilateral Custom Foot Orthoses ?Patient / Caregiver Report: Patient is ready for foot orthotics ? ?OBJECTIVE DATA: ?Patient History / Diagnosis:  ?  ICD-10-CM   ?1. Plantar fasciitis  M72.2   ?  ?2. Posterior tibial tendonitis, unspecified laterality  M76.829   ?  ?3. Bunion, left  M21.612   ?  ? ? ?Current or Previous Devices:   None and no history ? ?Foot Examination: ?Skin presentation:   Intact ?Ulcers & Callousing:   None ?Toe / Foot Deformities:  Bunion ?Weight Bearing Presentation:  Planus ?Sensation:    Intact ? ?Shoe Size:    11 ? ?ORTHOTIC RECOMMENDATION ?Recommended Device: 1x pair of custom functional foot orthotics ? ?GOALS OF ORTHOSES ?- Reduce Pain ?- Prevent Foot Deformity ?- Prevent Progression of Further Foot Deformity ?- Relieve Pressure ?- Improve the Overall Biomechanical Function of the Foot and Lower Extremity. ? ?ACTIONS PERFORMED ?Potential out of pocket cost was communicated to patient. Patient understood and consent to casting. Patient was casted for Foot Orthoses via crush box. Procedure was explained and patient tolerated procedure well. Casts were shipped to central fabrication. All questions were answered and concerns addressed. ? ?PLAN ?Patient is to be called for fitting when devices are ready.  ? ? ?

## 2022-02-14 ENCOUNTER — Ambulatory Visit: Payer: BC Managed Care – PPO | Admitting: Podiatry

## 2022-02-14 ENCOUNTER — Other Ambulatory Visit: Payer: BC Managed Care – PPO

## 2022-03-02 ENCOUNTER — Encounter: Payer: Self-pay | Admitting: Podiatry

## 2022-03-15 ENCOUNTER — Other Ambulatory Visit: Payer: Self-pay | Admitting: Internal Medicine

## 2022-03-17 ENCOUNTER — Ambulatory Visit: Payer: Medicaid Other | Admitting: Internal Medicine

## 2022-03-25 ENCOUNTER — Telehealth: Payer: Self-pay

## 2022-03-25 NOTE — Telephone Encounter (Signed)
Returned call from pt, vm not set up. Pt called to check on status of orthotics. Pt no showed pick up appt 04/24.

## 2022-04-04 DIAGNOSIS — G5603 Carpal tunnel syndrome, bilateral upper limbs: Secondary | ICD-10-CM | POA: Insufficient documentation

## 2022-04-04 DIAGNOSIS — G8929 Other chronic pain: Secondary | ICD-10-CM | POA: Insufficient documentation

## 2022-04-04 DIAGNOSIS — M5412 Radiculopathy, cervical region: Secondary | ICD-10-CM | POA: Insufficient documentation

## 2022-04-04 NOTE — Progress Notes (Signed)
PCP:  Barnie Mort, PA-C Primary Cardiologist: None Electrophysiologist: Lewayne Bunting, MD   Christy Holland is a 51 y.o. female seen today for Lewayne Bunting, MD for routine electrophysiology followup.  Since last being seen in our clinic the patient reports doing very well. She is undergoing work up for bariatric surgery.  she denies chest pain, palpitations, dyspnea, PND, orthopnea, nausea, vomiting, dizziness, syncope, edema, weight gain, or early satiety.  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     clonazePAM (KLONOPIN) 1 MG tablet Take 1 tablet (1 mg total) by mouth 2 (two) times daily. 30 tablet 0   fluticasone (FLONASE) 50 MCG/ACT nasal spray Place 1 spray into both nostrils daily.     metFORMIN (GLUCOPHAGE-XR) 500 MG 24 hr tablet Take 500 mg by mouth 2 (two) times daily.     Multiple Vitamin (MULTIVITAMIN) capsule Take 1 capsule by mouth daily.     pregabalin (LYRICA) 50 MG capsule Take 50 mg by mouth 3 (three) times daily.     Semaglutide, 2 MG/DOSE, (OZEMPIC, 2 MG/DOSE,) 8 MG/3ML SOPN Inject 2 mg into the skin once a week.     topiramate (TOPAMAX) 50 MG tablet Take 1 tablet by mouth daily.     traZODone (DESYREL) 150 MG tablet Take 1 tablet (150 mg total) by mouth at bedtime. 90 tablet 3   Vitamin D, Ergocalciferol, (DRISDOL) 1.25 MG (50000 UT) CAPS capsule Take 50,000 Units by mouth every 7 (seven) days.      metoprolol tartrate (LOPRESSOR) 50 MG tablet Take 1 tablet (50 mg total) by mouth 2 (two) times daily. 180 tablet 3   No current facility-administered medications for this visit.    Allergies  Allergen Reactions   Sulfasalazine Hives   Sulfa Antibiotics     Social History   Socioeconomic History   Marital status: Married    Spouse name: Not on file   Number of children: 3   Years of education: Not on file   Highest education level: Not on file  Occupational History   Not on file  Tobacco Use   Smoking status: Never   Smokeless tobacco: Never  Vaping Use   Vaping Use: Never used  Substance and Sexual Activity   Alcohol use: No    Alcohol/week: 0.0 standard drinks of alcohol   Drug use: No   Sexual activity: Yes    Birth control/protection: Condom  Other Topics Concern   Not on file  Social History Narrative   ** Merged History Encounter **       Social Determinants of Health   Financial Resource Strain: Not on file  Food Insecurity: Not on file  Transportation Needs: Not on file  Physical Activity: Not on file  Stress: Not on file  Social Connections: Not on file  Intimate Partner Violence: Not on file     Review of Systems: All other systems reviewed and are  otherwise negative except as noted above.  Physical Exam: Vitals:   04/12/22 1159  BP: 98/72  Pulse: 87  SpO2: 97%  Weight: 226 lb 12.8 oz (102.9 kg)  Height: 5\' 7"  (1.702 m)    GEN- The patient is well appearing, alert and oriented x 3 today.   HEENT: normocephalic, atraumatic; sclera clear, conjunctiva pink; hearing intact; oropharynx clear; neck supple, no JVP Lymph- no cervical lymphadenopathy Lungs- Clear to ausculation bilaterally, normal work of breathing.  No wheezes, rales, rhonchi Heart- Regular rate and rhythm, no murmurs, rubs or gallops, PMI not laterally displaced GI- soft, non-tender, non-distended, bowel sounds present, no hepatosplenomegaly Extremities- no clubbing, cyanosis,  or edema; DP/PT/radial pulses 2+ bilaterally MS- no significant deformity or atrophy Skin- warm and dry, no rash or lesion Psych- euthymic mood, full affect Neuro- strength and sensation are intact  EKG is ordered. Personal review of EKG from today shows NSR in 80s  Additional studies reviewed include: Previous EP office notes.   Assessment and Plan:  1. PVCs None on EKG today  2. HTN Stable on current regimen   3. Cardiac Clearance for Bariatric surgery Pt has no cardiac contraindications to proceeding. Overall low risk.   Follow up with Dr. in 12 months   [name], Graciella Freer  04/12/22 12:08 PM

## 2022-04-12 ENCOUNTER — Ambulatory Visit (INDEPENDENT_AMBULATORY_CARE_PROVIDER_SITE_OTHER): Payer: BC Managed Care – PPO | Admitting: Student

## 2022-04-12 ENCOUNTER — Encounter: Payer: Self-pay | Admitting: Student

## 2022-04-12 VITALS — BP 98/72 | HR 87 | Ht 67.0 in | Wt 226.8 lb

## 2022-04-12 DIAGNOSIS — I493 Ventricular premature depolarization: Secondary | ICD-10-CM

## 2022-04-12 DIAGNOSIS — Z01818 Encounter for other preprocedural examination: Secondary | ICD-10-CM | POA: Diagnosis not present

## 2022-04-12 DIAGNOSIS — I10 Essential (primary) hypertension: Secondary | ICD-10-CM

## 2022-04-12 NOTE — Patient Instructions (Signed)
Medication Instructions:  Your physician recommends that you continue on your current medications as directed. Please refer to the Current Medication list given to you today.  *If you need a refill on your cardiac medications before your next appointment, please call your pharmacy*   Lab Work: None  If you have labs (blood work) drawn today and your tests are completely normal, you will receive your results only by: MyChart Message (if you have MyChart) OR A paper copy in the mail If you have any lab test that is abnormal or we need to change your treatment, we will call you to review the results.   Follow-Up: At [facility], you and your health needs are our priority.  As part of our continuing mission to provide you with exceptional heart care, we have created designated Provider Care Teams.  These Care Teams include your primary Cardiologist (physician) and Advanced Practice Providers (APPs -  Physician Assistants and Nurse Practitioners) who all work together to provide you with the care you need, when you need it.  We recommend signing up for the patient portal called "MyChart".  Sign up information is provided on this After Visit Summary.  MyChart is used to connect with patients for Virtual Visits (Telemedicine).  Patients are able to view lab/test results, encounter notes, upcoming appointments, etc.  Non-urgent messages can be sent to your provider as well.   To learn more about what you can do with MyChart, go to [email or web address].    Your next appointment:   1 year(s)  The format for your next appointment:   In Person  Provider:   Gregg Taylor, MD{ 

## 2022-04-13 ENCOUNTER — Telehealth: Payer: Self-pay | Admitting: *Deleted

## 2022-04-13 NOTE — Telephone Encounter (Signed)
Gave completed/signed surgical clearance form back to medical records to process for pt.

## 2022-04-28 ENCOUNTER — Ambulatory Visit (INDEPENDENT_AMBULATORY_CARE_PROVIDER_SITE_OTHER): Payer: BC Managed Care – PPO

## 2022-04-28 DIAGNOSIS — M722 Plantar fascial fibromatosis: Secondary | ICD-10-CM

## 2022-04-28 NOTE — Progress Notes (Signed)
Patient presents today to pick up custom molded foot orthotics, diagnosed with plantar fasciitis by Dr. Wagoner.   Orthotics were dispensed and fit was satisfactory. Reviewed instructions for break-in and wear. Written instructions given to patient.  Patient will follow up as needed.      

## 2022-05-19 ENCOUNTER — Ambulatory Visit (INDEPENDENT_AMBULATORY_CARE_PROVIDER_SITE_OTHER): Payer: BC Managed Care – PPO | Admitting: Podiatry

## 2022-05-19 ENCOUNTER — Ambulatory Visit (INDEPENDENT_AMBULATORY_CARE_PROVIDER_SITE_OTHER): Payer: BC Managed Care – PPO

## 2022-05-19 DIAGNOSIS — M76822 Posterior tibial tendinitis, left leg: Secondary | ICD-10-CM

## 2022-05-19 DIAGNOSIS — M722 Plantar fascial fibromatosis: Secondary | ICD-10-CM | POA: Diagnosis not present

## 2022-05-19 DIAGNOSIS — M76829 Posterior tibial tendinitis, unspecified leg: Secondary | ICD-10-CM | POA: Diagnosis not present

## 2022-05-19 DIAGNOSIS — G5751 Tarsal tunnel syndrome, right lower limb: Secondary | ICD-10-CM

## 2022-05-19 DIAGNOSIS — M76821 Posterior tibial tendinitis, right leg: Secondary | ICD-10-CM | POA: Diagnosis not present

## 2022-05-19 MED ORDER — METHYLPREDNISOLONE 4 MG PO TBPK: ORAL_TABLET | ORAL | 0 refills | Status: DC

## 2022-05-19 NOTE — Patient Instructions (Signed)
For instructions on how to put on your Tri-Lock Ankle Brace, please visit [email or web address] 

## 2022-05-19 NOTE — Progress Notes (Signed)
Subjective: Chief Complaint  Patient presents with   Bunions    Pt states that she is doing worse than the last visit, pt states she needs adjustments to orthotics, pt is having more pain in the right ankle and leg pain rate 8 out 11    51 year old female presents to the office for follow-up evaluation.  She states that she did some numbness and tenderness in her symptoms of the leg.  She also does endorse back pain.  She is on physical therapy for the back.  Right side seems to be worse than the left.  She was on the medial aspect ankle skin discomfort.  No recent injury or changes.  Objective: AAO x3, NAD DP/PT pulses palpable bilaterally, CRT less than 3 seconds There is still tenderness palpation on the plantar medial tubercle of the parakeets at insertion of plantar fascia. There is moderate tenderness that appears to be along the course the posterior tibial tendon right side worse than left.  There is a positive Tinel's sign on the right side today.  Decreased medial arch height.  Bunion still present. No pain with calf compression, swelling, warmth, erythema  Assessment: Plan the patient has chronic posterior tibial tendon dysfunction, rule out tarsal tunnel  Plan: -All treatment options discussed with the patient including all alternatives, risks, complications.  -Trilock brace dispensed for immobilization on the right side. -Given her symptoms I will order an MRI to rule out tear to the posterior tibial tendon and also to evaluate the tarsal tunnel. -We discussed physical therapy but will await the results of the MRI. -Discussed supportive shoe gear and good arch support. -Toe sperateor left for bunion.  Continue offloading. -Patient encouraged to call the office with any questions, concerns, change in symptoms.   Vivi Barrack DPM

## 2022-05-31 ENCOUNTER — Ambulatory Visit: Payer: BC Managed Care – PPO | Admitting: Internal Medicine

## 2022-06-01 ENCOUNTER — Encounter (INDEPENDENT_AMBULATORY_CARE_PROVIDER_SITE_OTHER): Payer: Self-pay

## 2022-06-09 ENCOUNTER — Telehealth: Payer: Self-pay | Admitting: Family Medicine

## 2022-06-09 NOTE — Telephone Encounter (Signed)
Mailed form to the patient sitting in the bin to be picked up. Pre Op medical clearance.

## 2022-06-30 IMAGING — DX DG CHEST 2V
2 series · 2 of 2 positions shown · non-contrast
Comparison: May 04, 2016

CLINICAL DATA: Dyspnea on exertion

EXAM:
CHEST - 2 VIEW

[chest pa]
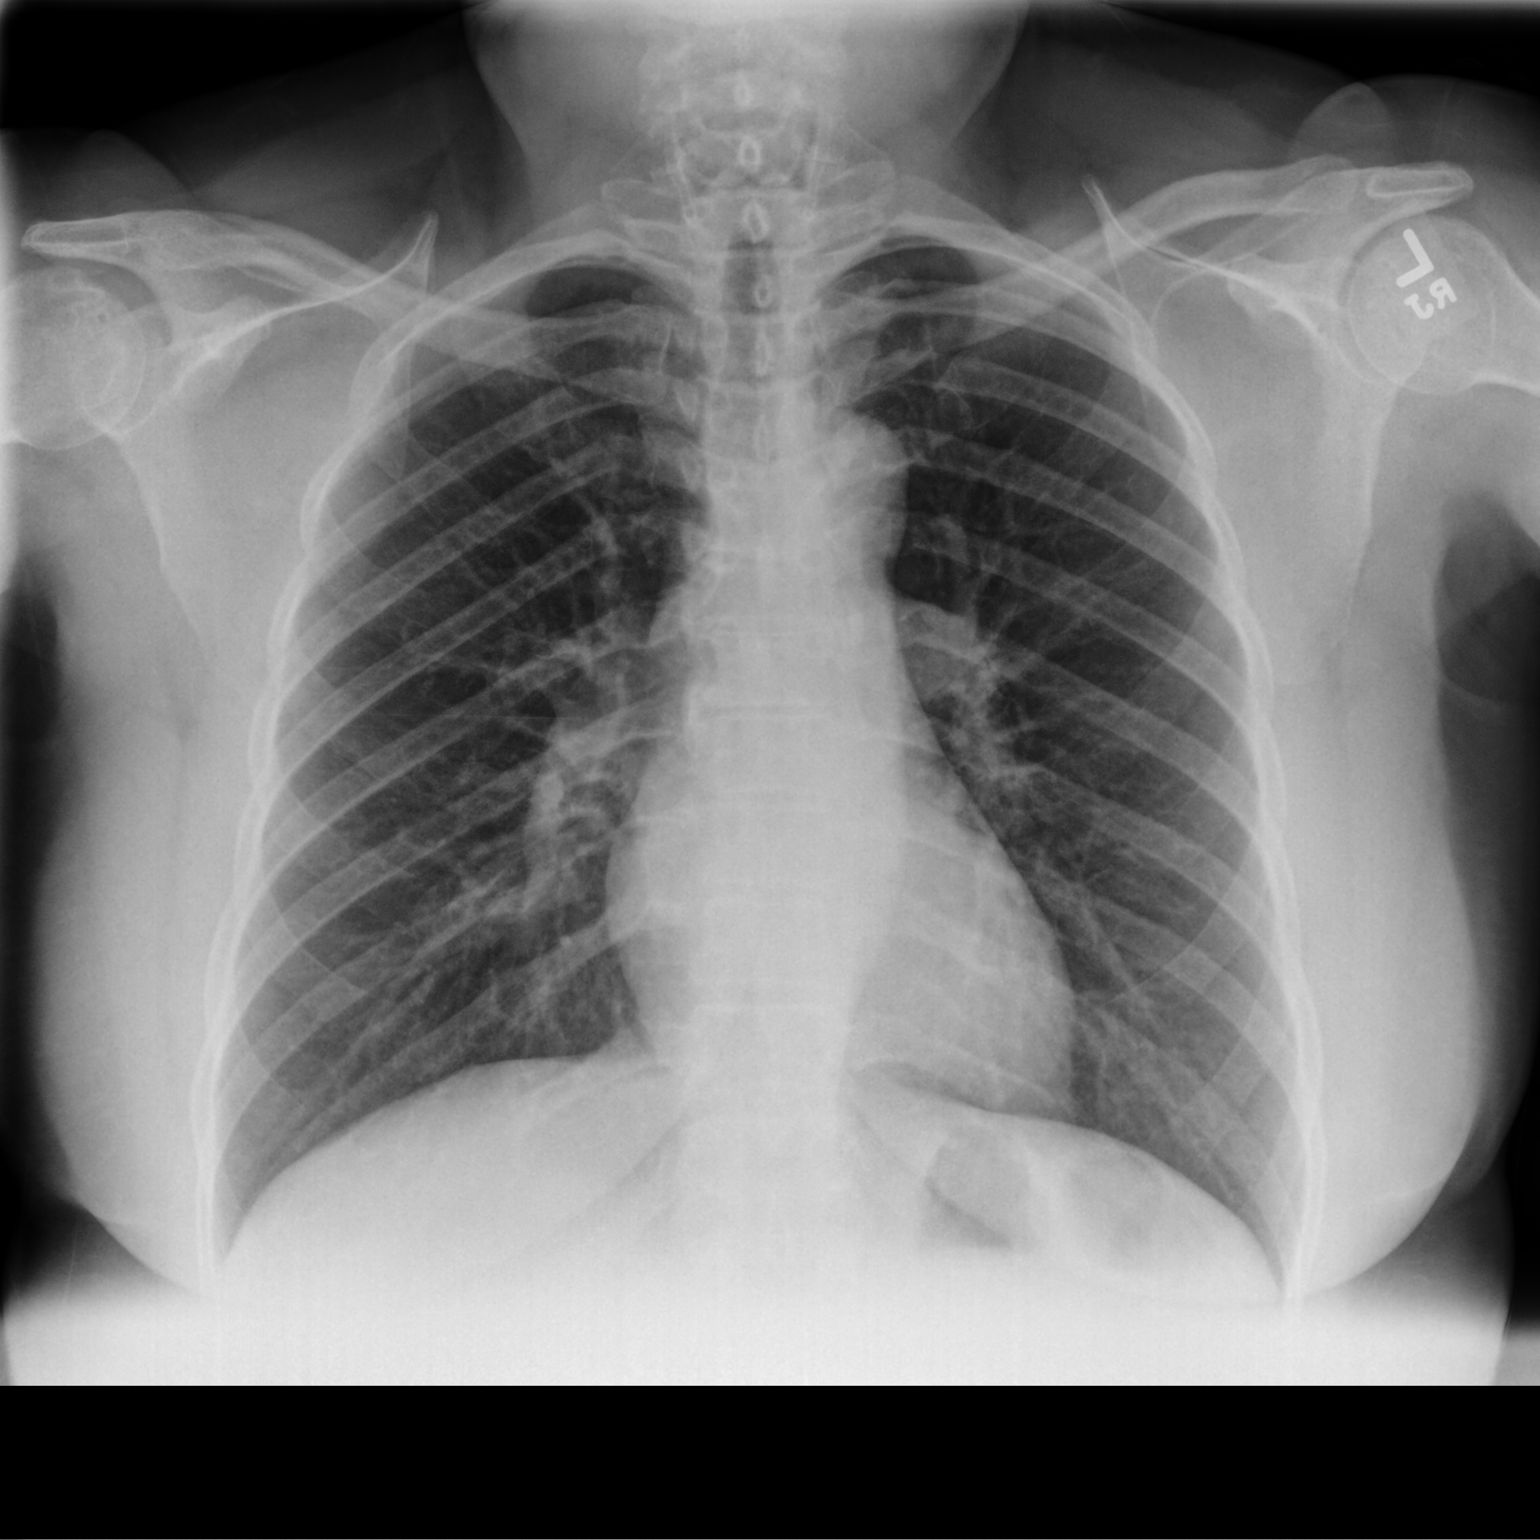

[chest lat]
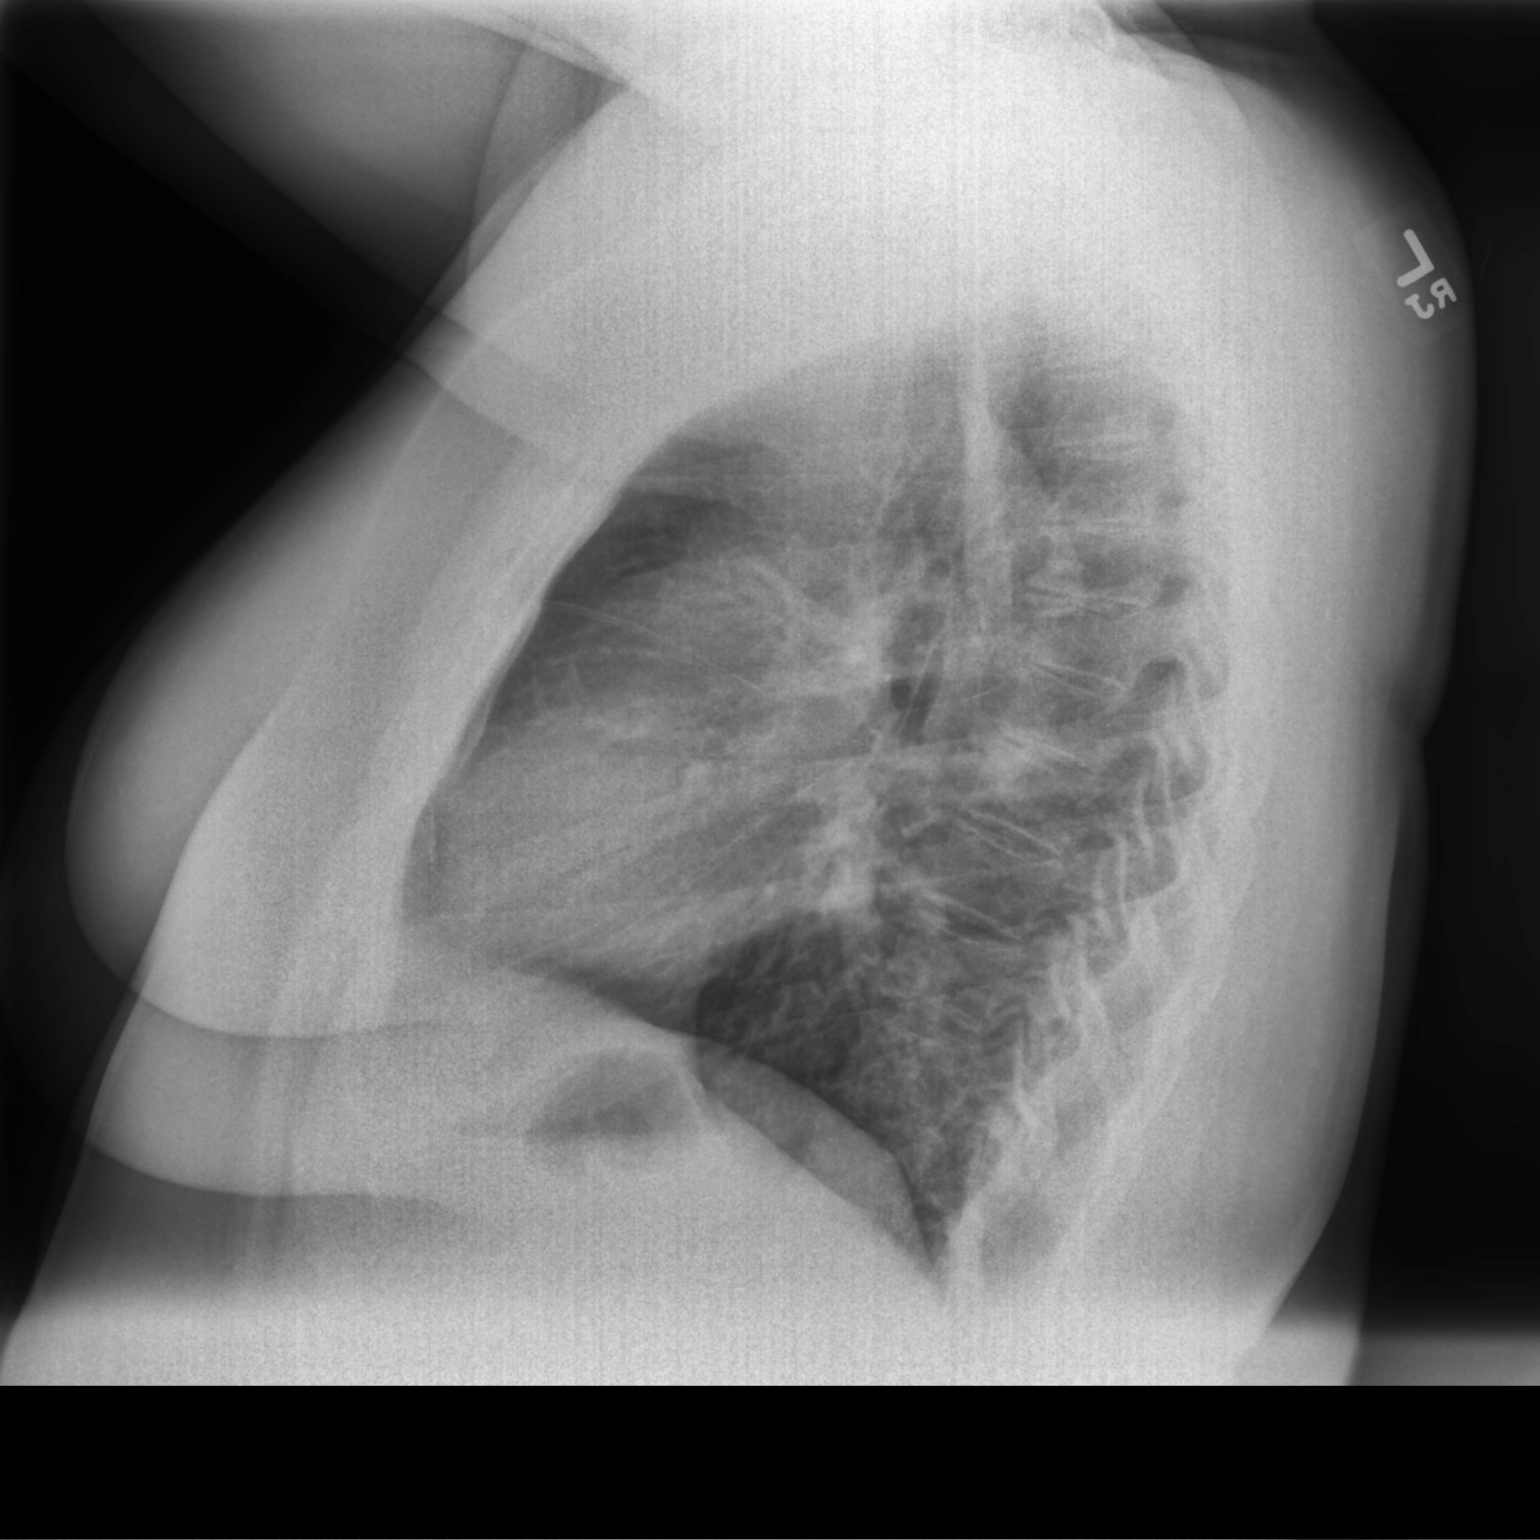

[2 of 2 positions shown; findings below may reference images not displayed]

FINDINGS: The heart size and mediastinal contours are within normal limits.
Both lungs are clear. The visualized skeletal structures are
unremarkable.
IMPRESSION: No active cardiopulmonary disease.

## 2022-07-04 ENCOUNTER — Telehealth: Payer: Self-pay | Admitting: Internal Medicine

## 2022-07-04 NOTE — Telephone Encounter (Signed)
Patient is wanting a call to clarify medication and/or a letter to explain what she should take vs. Should not take to tell other doctors and nurses when they want her to take a certain medication.

## 2022-07-04 NOTE — Telephone Encounter (Signed)
Pt was calling to find out if NSAIDS are, or are not ok to take.  Other providers have recommended Meloxicam, Ibuprofen, etc.... Pt wants clarity or document to show them what she can take, that wouldn't impact her cardiology plan of care. I am working with Dr. [name] tomorrow in clinic, and will f/u with him then.

## 2022-07-06 ENCOUNTER — Telehealth (INDEPENDENT_AMBULATORY_CARE_PROVIDER_SITE_OTHER): Payer: BC Managed Care – PPO

## 2022-07-11 ENCOUNTER — Encounter: Payer: Self-pay | Admitting: Emergency Medicine

## 2022-07-11 ENCOUNTER — Ambulatory Visit
Admission: EM | Admit: 2022-07-11 | Discharge: 2022-07-11 | Disposition: A | Discharge status: Home / Self Care | Payer: BC Managed Care – PPO | Attending: Emergency Medicine | Admitting: Emergency Medicine

## 2022-07-11 ENCOUNTER — Other Ambulatory Visit: Payer: Self-pay | Admitting: Internal Medicine

## 2022-07-11 ENCOUNTER — Other Ambulatory Visit: Payer: Self-pay

## 2022-07-11 DIAGNOSIS — J069 Acute upper respiratory infection, unspecified: Secondary | ICD-10-CM | POA: Diagnosis not present

## 2022-07-11 DIAGNOSIS — J310 Chronic rhinitis: Secondary | ICD-10-CM | POA: Insufficient documentation

## 2022-07-11 DIAGNOSIS — Z1152 Encounter for screening for COVID-19: Secondary | ICD-10-CM | POA: Diagnosis present

## 2022-07-11 DIAGNOSIS — J329 Chronic sinusitis, unspecified: Secondary | ICD-10-CM | POA: Diagnosis not present

## 2022-07-11 DIAGNOSIS — N760 Acute vaginitis: Secondary | ICD-10-CM | POA: Insufficient documentation

## 2022-07-11 DIAGNOSIS — Z20822 Contact with and (suspected) exposure to covid-19: Secondary | ICD-10-CM | POA: Insufficient documentation

## 2022-07-11 DIAGNOSIS — R300 Dysuria: Secondary | ICD-10-CM | POA: Diagnosis not present

## 2022-07-11 LAB — POCT URINALYSIS DIP (MANUAL ENTRY)
Bilirubin, UA: NEGATIVE
Blood, UA: NEGATIVE
Glucose, UA: NEGATIVE mg/dL
Ketones, POC UA: NEGATIVE mg/dL
Leukocytes, UA: NEGATIVE
Nitrite, UA: NEGATIVE
Protein Ur, POC: NEGATIVE mg/dL
Spec Grav, UA: 1.01 (ref 1.010–1.025)
Urobilinogen, UA: 0.2 E.U./dL
pH, UA: 7 (ref 5.0–8.0)

## 2022-07-11 LAB — SARS CORONAVIRUS 2 BY RT PCR: SARS Coronavirus 2 by RT PCR: NEGATIVE

## 2022-07-11 MED ORDER — IPRATROPIUM BROMIDE 0.06 % NA SOLN: 2.0000 | Freq: Three times a day (TID) | NASAL | 1 refills | Status: DC

## 2022-07-11 MED ORDER — CIPROFLOXACIN HCL 250 MG PO TABS: 250.0000 mg | ORAL_TABLET | Freq: Two times a day (BID) | ORAL | 0 refills | Status: AC

## 2022-07-11 MED ORDER — IBUPROFEN 400 MG PO TABS: 400.0000 mg | ORAL_TABLET | Freq: Three times a day (TID) | ORAL | 0 refills | Status: DC | PRN

## 2022-07-11 MED ORDER — FLUTICASONE PROPIONATE 50 MCG/ACT NA SUSP: 1.0000 | Freq: Every day | NASAL | 2 refills | Status: DC

## 2022-07-11 NOTE — ED Triage Notes (Signed)
Complains of runny nose, sneezing, headache, dental pain.  Reports thick congestion, nose chest congestion, headache and pressure in ears.  Symptoms started about one week ago.  Yellow green congestion started Friday.  Has tried sudafed, decongestants and expectorants, and tylenol with minimal relief.    UTI symptoms started Friday or Saturday, that she was having pain at the end of urinary stream.  No OTC meds tried for this complaint, has increased water intake

## 2022-07-11 NOTE — ED Notes (Signed)
Obtained covid, verified labeling of specimen at bedside

## 2022-07-11 NOTE — Discharge Instructions (Signed)
You were advised to begin antibiotics today because you are having active symptoms of an acute lower urinary tract infection also known as cystitis.  It is very important that you take all doses exactly as prescribed.  Incomplete antibiotic therapy can cause worsening urinary tract infection that can become aggressive, escape from urinary tract into your bloodstream causing sepsis which will require hospitalization.  Please pick up and begin taking your prescription for Ciprofloxacin 250 mg as soon as possible.  You can take this medication with or without food.  This medication is safe to take with your other medications.   The urinalysis that we performed in the clinic today was normal.  Urine culture will be performed because you are reporting symptoms of cystitis.  The result of the urine culture will be available in the next 3 to 5 days and will be posted to your MyChart account.  If there is a finding that alters your course of treatment, you will be contacted by phone and advised of further treatment recommendations.   Common causes of urinary tract infections include but are not limited to holding your urine longer than you should, squatting instead of sitting down when urinating, sitting around in wet clothing such as a wet swimsuit or gym clothes too long, not emptying your bladder after having sexual intercourse, wiping from back to front instead of front to back after having a bowel movement.  Less common causes of urinary tract infections include but are not limited to anatomical shifts in the location of your bladder or uterus causing obstruction of passage of urine from your bladder to your urethra where your urine comes out or prolapse of your rectum into your vaginal wall.  These less common causes can be evaluated by gynecologist, a urologist or subspecialist called a uro-gynecologist   The results of your vaginal swab test which screens for BV, yeast, gonorrhea, chlamydia and trichomonas  will be made available to you once it is complete.  This typically takes 3 to 5 days.  Please note that we do not test for herpes virus unless you are having an active lesion concerning for herpes outbreak.  Please abstain from sexual intercourse of any kind, vaginal, oral or anal, until you have received the results of your STD testing.    The results will initially be posted to your MyChart account and, if any of your results are abnormal, you will receive a phone call regarding treatment.  Prescriptions, if any are needed, will be provided for you at your pharmacy.    Your symptoms and physical exam findings are concerning for a viral upper respiratory infection.     You were tested for COVID-19 today at your request.  This is a PCR test.  The result of your COVID-19 test will be posted to your MyChart once it is complete, typically this takes 6 to 12 hours.  If your COVID-19 test is positive you will be contacted by phone.  Because you do not have a history of being immune compromised, you are currently vaccinated for COVID-19, you are under the age of 46, and/or you do not have a risk of severe disease due to COVID-19, antiviral treatment is not indicated.   Based on my physical exam findings, testing for streptococcal pharyngitis is not indicated.  Also based on my physical exam findings and the history you have provided  today, I do not recommend antibiotics at this time.  I do not believe the risks and side effects  of antibiotics would outweigh any minimal benefit that they might provide.       Your symptoms and my physical exam findings are concerning for exacerbation of your underlying allergies.     Please see the list below for recommended medications, dosages and frequencies to provide relief of current symptoms:     Zyrtec (cetirizine): This is an excellent second-generation antihistamine that helps to reduce respiratory inflammatory response to environmental allergens.  In some  patients, this medication can cause daytime sleepiness so I recommend that you take 1 tablet daily at bedtime.     Flonase (fluticasone): This is a steroid nasal spray that you use once daily, 1 spray in each nare.  This medication does not work well if you decide to use it only used as you feel you need to, it works best used on a daily basis.  After 3 to 5 days of use, you will notice significant reduction of the inflammation and mucus production that is currently being caused by exposure to allergens, whether seasonal or environmental.  The most common side effect of this medication is nosebleeds.  If you experience a nosebleed, please discontinue use for 1 week, then feel free to resume.  I have provided you with a prescription.     Atrovent (ipratropium): This is an excellent nasal decongestant spray I have added to your recommended nasal steroid that will not cause rebound congestion, please instill 2 sprays into each nare with each use.  Because nasal steroids can take several days before they begin to provide full benefit, I recommend that you use this spray in addition to the nasal steroid prescribed for you.  Please use it after you have used your nasal steroid and repeat up to 4 times daily as needed.  I have provided you with a prescription for this medication.      Advil, Motrin (ibuprofen): This is a good anti-inflammatory medication which addresses aches, pains and inflammation of the upper airways that causes sinus and nasal congestion as well as in the lower airways which makes your cough feel tight and sometimes burn.  I recommend that you take between 400 to 600 mg every 6-8 hours as needed.      Sudafed (pseudoephedrine): This is a decongestant.  This medication has to be purchased from the pharmacist counter, I recommend taking 2 tablets, 60 mg, 2-3 times a day as needed to relieve runny nose and sinus drainage.  This medication is available over-the-counter however I have sent a  prescription for your convenience.   Robitussin, Mucinex (guaifenesin): This is an expectorant.  This helps break up chest congestion and loosen up thick nasal drainage making phlegm and drainage more liquid and therefore easier to remove.  I recommend being 400 mg three times daily as needed.      If your insurance will not cover your allergy medications, please consider downloading the [employer] app which is free.  You can find considerable discounts on prescription and over-the-counter medications.   If you have not had meaningful resolution of your symptoms after completing treatment as prescribed, please return to urgent care for repeat evaluation or follow-up with your primary care provider.   Thank you for visiting urgent care today.  I appreciate the opportunity to participate in your care.

## 2022-07-11 NOTE — ED Provider Notes (Signed)
[facility]    CSN: [id number] Arrival date & time: 07/11/22  4098    HISTORY   Chief Complaint  Patient presents with   Urinary Tract Infection   URI   HPI Christy Holland is a pleasant, 51 y.o. female who presents to urgent care today. Patient complains of burning at the end of urinating for the past 3 days.  Patient states she has not tried any medications to relieve her symptoms but has been drinking more water.  Patient also complains of runny nose, sneezing, headache and dental pain.  Patient states she has had the congestion in her nose and chest.  Patient states her symptoms began about a week ago.  Patient states she began to have yellow congestion 3 days ago.  Patient states she has tried taking Sudafed, other decongestants and expectorants as well as Tylenol with minimal relief of her symptoms.  Patient has normal vital signs on arrival today.  The history is provided by the patient.   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    Home Medications    Prior to Admission medications   Medication Sig Start Date End Date  Taking? Authorizing Provider  cetirizine (ZYRTEC) 10 MG tablet Take 1 tablet by mouth daily. 01/21/22   [provider]  clonazePAM (KLONOPIN) 1 MG tablet Take 1 tablet (1 mg total) by mouth 2 (two) times daily. 11/12/21   Waymon Budge, MD  fluticasone (FLONASE) 50 MCG/ACT nasal spray Place 1 spray into both nostrils daily. 03/15/22   [provider]  metFORMIN (GLUCOPHAGE-XR) 500 MG 24 hr tablet Take 500 mg by mouth 2 (two) times daily. 11/02/21   [provider]  methylPREDNISolone (MEDROL DOSEPAK) 4 MG TBPK tablet Take as directed Patient not taking: Reported on 07/11/2022 05/19/22   Vivi Barrack, DPM  metoprolol tartrate (LOPRESSOR) 50 MG tablet Take 1 tablet (50 mg total) by mouth 2 (two) times daily. 08/18/21 11/16/21  Marinus Maw, MD  Multiple Vitamin (MULTIVITAMIN) capsule Take 1 capsule by mouth daily.    [provider]  pregabalin (LYRICA) 50 MG capsule Take 50 mg by mouth 3 (three) times daily. 03/15/22   [provider]  Semaglutide, 2 MG/DOSE, (OZEMPIC, 2 MG/DOSE,) 8 MG/3ML SOPN Inject 2 mg into the skin once a week. 03/30/22   [provider]  topiramate (TOPAMAX) 50 MG tablet Take 1 tablet by mouth daily. 03/01/22   [provider]  traZODone (DESYREL) 150 MG tablet Take 1 tablet (150 mg total) by mouth at bedtime. 11/17/21   Lomax, Amy, NP  Vitamin D, Ergocalciferol, (DRISDOL) 1.25 MG (50000  UT) CAPS capsule Take 50,000 Units by mouth every 7 (seven) days. 03/29/19   [provider]    Family History Family History  Problem Relation Age of Onset   Hypertension Mother    Hyperlipidemia Mother    Diabetes Mother    Hypertension Father    Hyperlipidemia Father    Diabetes Father    Heart attack Father 68   Stroke Father    Social History Social History   Tobacco Use   Smoking status: Never   Smokeless tobacco: Never  Vaping Use   Vaping Use: Never used  Substance Use Topics   Alcohol use: No     Alcohol/week: 0.0 standard drinks of alcohol   Drug use: No   Allergies   Sulfa antibiotics and Sulfasalazine  Review of Systems Review of Systems Pertinent findings revealed after performing a 14 point review of systems has been noted in the history of present illness.  Physical Exam Triage Vital Signs ED Triage Vitals  Enc Vitals Group     BP 08/20/21 0827 (!) 147/82     Pulse Rate 08/20/21 0827 72     Resp 08/20/21 0827 18     Temp 08/20/21 0827 98.3 F (36.8 C)     Temp Source 08/20/21 0827 Oral     SpO2 08/20/21 0827 98 %     Weight --      Height --      Head Circumference --      Peak Flow --      Pain Score 08/20/21 0826 5     Pain Loc --      Pain Edu? --      Excl. in GC? --   No data found.  Updated Vital Signs BP 106/73 (BP Location: Right Arm)   Pulse 73   Temp 98.9 F (37.2 C) (Oral)   Resp 16   SpO2 96%   Physical Exam Vitals and nursing note reviewed.  Constitutional:      General: She is not in acute distress.    Appearance: Normal appearance. She is not ill-appearing.  HENT:     Head: Normocephalic and atraumatic.     Salivary Glands: Right salivary gland is not diffusely enlarged or tender. Left salivary gland is not diffusely enlarged or tender.     Right Ear: Ear canal and external ear normal. No drainage. A middle ear effusion is present. There is no impacted cerumen. Tympanic membrane is bulging. Tympanic membrane is not injected or erythematous.     Left Ear: Ear canal and external ear normal. No drainage. A middle ear effusion is present. There is no impacted cerumen. Tympanic membrane is bulging. Tympanic membrane is not injected or erythematous.     Ears:     Comments: Bilateral EACs normal, both TMs bulging with clear fluid    Nose: Rhinorrhea present. No nasal deformity, septal deviation, signs of injury, nasal tenderness, mucosal edema or congestion. Rhinorrhea is clear.     Right Nostril: Occlusion present. No foreign body, epistaxis  or septal hematoma.     Left Nostril: Occlusion present. No foreign body, epistaxis or septal hematoma.     Right Turbinates: Enlarged, swollen and pale.     Left Turbinates: Enlarged, swollen and pale.     Right Sinus: No maxillary sinus tenderness or frontal sinus tenderness.     Left Sinus: No maxillary sinus tenderness or frontal sinus tenderness.     Mouth/Throat:     Lips: Pink. No lesions.  Mouth: Mucous membranes are moist. No oral lesions.     Pharynx: Oropharynx is clear. Uvula midline. No posterior oropharyngeal erythema or uvula swelling.     Tonsils: No tonsillar exudate. 0 on the right. 0 on the left.     Comments: Postnasal drip Eyes:     General: Lids are normal.        Right eye: No discharge.        Left eye: No discharge.     Extraocular Movements: Extraocular movements intact.     Conjunctiva/sclera: Conjunctivae normal.     Right eye: Right conjunctiva is not injected.     Left eye: Left conjunctiva is not injected.  Neck:     Trachea: Trachea and phonation normal.  Cardiovascular:     Rate and Rhythm: Normal rate and regular rhythm.     Pulses: Normal pulses.     Heart sounds: Normal heart sounds. No murmur heard.    No friction rub. No gallop.  Pulmonary:     Effort: Pulmonary effort is normal. No accessory muscle usage, prolonged expiration or respiratory distress.     Breath sounds: Normal breath sounds. No stridor, decreased air movement or transmitted upper airway sounds. No decreased breath sounds, wheezing, rhonchi or rales.  Chest:     Chest wall: No tenderness.  Abdominal:     General: Abdomen is flat. Bowel sounds are normal. There is no distension.     Palpations: Abdomen is soft.     Tenderness: There is abdominal tenderness in the suprapubic area. There is no right CVA tenderness or left CVA tenderness.     Hernia: No hernia is present.  Genitourinary:    Comments: Patient politely declines pelvic exam today, patient provided a vaginal swab  for testing. Musculoskeletal:        General: Normal range of motion.     Cervical back: Normal range of motion and neck supple. Normal range of motion.  Lymphadenopathy:     Cervical: No cervical adenopathy.  Skin:    General: Skin is warm and dry.     Findings: No erythema or rash.  Neurological:     General: No focal deficit present.     Mental Status: She is alert and oriented to person, place, and time.  Psychiatric:        Mood and Affect: Mood normal.        Behavior: Behavior normal.     Visual Acuity Right Eye Distance:   Left Eye Distance:   Bilateral Distance:    Right Eye Near:   Left Eye Near:    Bilateral Near:     [facility] Couse / Diagnostics / Procedures:     Radiology No results found.  Procedures Procedures (including critical care time) EKG  Pending results:  Labs Reviewed  SARS CORONAVIRUS 2 BY RT PCR  POCT URINALYSIS DIP (MANUAL ENTRY)  CERVICOVAGINAL ANCILLARY ONLY    Medications Ordered in UC: Medications - No data to display  UC Diagnoses / Final Clinical Impressions(s)   I have reviewed the triage vital signs and the nursing notes.  Pertinent labs & imaging results that were available during my care of the patient were reviewed by me and considered in my medical decision making (see chart for details).    Final diagnoses:  Acute upper respiratory infection  Dysuria  Vulvovaginitis  Rhinosinusitis  Encounter for screening for COVID-19    STD screening was performed, patient advised that the results be posted to their MyChart  and if any of the results are positive, they will be notified by phone, further treatment will be provided as indicated based on results of STD screening. Patient was advised to abstain from sexual intercourse until that they receive the results of their STD testing.  Patient was also advised to use condoms to protect themselves from STD exposure. Urinalysis today was normal.  Patient was advised to begin  antibiotics today due to having active symptoms of urinary tract infection.                    Patient advised that they will be contacted with results and that adjustments to treatment will be provided as indicated based on the results.   Patient was advised of possibility that urine culture results may be negative if sample provided was obtained late in the day causing urine to be more diluted.  Patient was advised that if antibiotics were effective after the first 24 to 36 hours, despite negative urine culture result, it is recommended that they complete the full course as prescribed.    Patient provided with prescriptions for supportive medications and information about conservative care for what appears to be viral sinusitis secondary to uncontrolled upper respiratory allergies.  Return precautions advised.  ED Prescriptions     Medication Sig Dispense Auth. Provider   ibuprofen (ADVIL) 400 MG tablet Take 1 tablet (400 mg total) by mouth every 8 (eight) hours as needed for up to 30 doses. 30 tablet Theadora Rama Scales, PA-C   ipratropium (ATROVENT) 0.06 % nasal spray Place 2 sprays into both nostrils 3 (three) times daily. As needed for nasal congestion, runny nose 15 mL Theadora Rama Scales, PA-C   fluticasone (FLONASE) 50 MCG/ACT nasal spray Place 1 spray into both nostrils daily. Begin by using 2 sprays in each nare daily for 3 to 5 days, then decrease to 1 spray in each nare daily. 15.8 mL Theadora Rama Scales, PA-C   ciprofloxacin (CIPRO) 250 MG tablet Take 1 tablet (250 mg total) by mouth 2 (two) times daily for 5 days. 10 tablet Theadora Rama Scales, PA-C      PDMP not reviewed this encounter.  Disposition Upon Discharge:  Condition: stable for discharge home  Patient presented with concern for an acute illness with associated systemic symptoms and significant discomfort requiring urgent management. In my opinion, this is a condition that a prudent lay person (someone who  possesses an average knowledge of health and medicine) may potentially expect to result in complications if not addressed urgently such as respiratory distress, impairment of bodily function or dysfunction of bodily organs.   As such, the patient has been evaluated and assessed, work-up was performed and treatment was provided in alignment with urgent care protocols and evidence based medicine.  Patient/parent/caregiver has been advised that the patient may require follow up for further testing and/or treatment if the symptoms continue in spite of treatment, as clinically indicated and appropriate.  Routine symptom specific, illness specific and/or disease specific instructions were discussed with the patient and/or caregiver at length.  Prevention strategies for avoiding STD exposure were also discussed.  The patient will follow up with their current PCP if and as advised. If the patient does not currently have a PCP we will assist them in obtaining one.   The patient may need specialty follow up if the symptoms continue, in spite of conservative treatment and management, for further workup, evaluation, consultation and treatment as clinically indicated and appropriate.  Patient/parent/caregiver  verbalized understanding and agreement of plan as discussed.  All questions were addressed during visit.  Please see discharge instructions below for further details of plan.  Discharge Instructions:   Discharge Instructions      You were advised to begin antibiotics today because you are having active symptoms of an acute lower urinary tract infection also known as cystitis.  It is very important that you take all doses exactly as prescribed.  Incomplete antibiotic therapy can cause worsening urinary tract infection that can become aggressive, escape from urinary tract into your bloodstream causing sepsis which will require hospitalization.  Please pick up and begin taking your prescription for  Ciprofloxacin 250 mg as soon as possible.  You can take this medication with or without food.  This medication is safe to take with your other medications.   The urinalysis that we performed in the clinic today was normal.  Urine culture will be performed because you are reporting symptoms of cystitis.  The result of the urine culture will be available in the next 3 to 5 days and will be posted to your MyChart account.  If there is a finding that alters your course of treatment, you will be contacted by phone and advised of further treatment recommendations.   Common causes of urinary tract infections include but are not limited to holding your urine longer than you should, squatting instead of sitting down when urinating, sitting around in wet clothing such as a wet swimsuit or gym clothes too long, not emptying your bladder after having sexual intercourse, wiping from back to front instead of front to back after having a bowel movement.  Less common causes of urinary tract infections include but are not limited to anatomical shifts in the location of your bladder or uterus causing obstruction of passage of urine from your bladder to your urethra where your urine comes out or prolapse of your rectum into your vaginal wall.  These less common causes can be evaluated by gynecologist, a urologist or subspecialist called a uro-gynecologist   The results of your vaginal swab test which screens for BV, yeast, gonorrhea, chlamydia and trichomonas will be made available to you once it is complete.  This typically takes 3 to 5 days.  Please note that we do not test for herpes virus unless you are having an active lesion concerning for herpes outbreak.  Please abstain from sexual intercourse of any kind, vaginal, oral or anal, until you have received the results of your STD testing.    The results will initially be posted to your MyChart account and, if any of your results are abnormal, you will receive a phone call  regarding treatment.  Prescriptions, if any are needed, will be provided for you at your pharmacy.    Your symptoms and physical exam findings are concerning for a viral upper respiratory infection.     You were tested for COVID-19 today at your request.  This is a PCR test.  The result of your COVID-19 test will be posted to your MyChart once it is complete, typically this takes 6 to 12 hours.  If your COVID-19 test is positive you will be contacted by phone.  Because you do not have a history of being immune compromised, you are currently vaccinated for COVID-19, you are under the age of 48, and/or you do not have a risk of severe disease due to COVID-19, antiviral treatment is not indicated.   Based on my physical exam findings, testing for  streptococcal pharyngitis is not indicated.  Also based on my physical exam findings and the history you have provided  today, I do not recommend antibiotics at this time.  I do not believe the risks and side effects of antibiotics would outweigh any minimal benefit that they might provide.       Your symptoms and my physical exam findings are concerning for exacerbation of your underlying allergies.     Please see the list below for recommended medications, dosages and frequencies to provide relief of current symptoms:     Zyrtec (cetirizine): This is an excellent second-generation antihistamine that helps to reduce respiratory inflammatory response to environmental allergens.  In some patients, this medication can cause daytime sleepiness so I recommend that you take 1 tablet daily at bedtime.     Flonase (fluticasone): This is a steroid nasal spray that you use once daily, 1 spray in each nare.  This medication does not work well if you decide to use it only used as you feel you need to, it works best used on a daily basis.  After 3 to 5 days of use, you will notice significant reduction of the inflammation and mucus production that is currently being caused  by exposure to allergens, whether seasonal or environmental.  The most common side effect of this medication is nosebleeds.  If you experience a nosebleed, please discontinue use for 1 week, then feel free to resume.  I have provided you with a prescription.     Atrovent (ipratropium): This is an excellent nasal decongestant spray I have added to your recommended nasal steroid that will not cause rebound congestion, please instill 2 sprays into each nare with each use.  Because nasal steroids can take several days before they begin to provide full benefit, I recommend that you use this spray in addition to the nasal steroid prescribed for you.  Please use it after you have used your nasal steroid and repeat up to 4 times daily as needed.  I have provided you with a prescription for this medication.      Advil, Motrin (ibuprofen): This is a good anti-inflammatory medication which addresses aches, pains and inflammation of the upper airways that causes sinus and nasal congestion as well as in the lower airways which makes your cough feel tight and sometimes burn.  I recommend that you take between 400 to 600 mg every 6-8 hours as needed.      Sudafed (pseudoephedrine): This is a decongestant.  This medication has to be purchased from the pharmacist counter, I recommend taking 2 tablets, 60 mg, 2-3 times a day as needed to relieve runny nose and sinus drainage.  This medication is available over-the-counter however I have sent a prescription for your convenience.   Robitussin, Mucinex (guaifenesin): This is an expectorant.  This helps break up chest congestion and loosen up thick nasal drainage making phlegm and drainage more liquid and therefore easier to remove.  I recommend being 400 mg three times daily as needed.      If your insurance will not cover your allergy medications, please consider downloading the [employer] app which is free.  You can find considerable discounts on prescription and  over-the-counter medications.   If you have not had meaningful resolution of your symptoms after completing treatment as prescribed, please return to urgent care for repeat evaluation or follow-up with your primary care provider.   Thank you for visiting urgent care today.  I appreciate the opportunity to  participate in your care.       This office note has been dictated using Teaching laboratory technician.  Unfortunately, this method of dictation can sometimes lead to typographical or grammatical errors.  I apologize for your inconvenience in advance if this occurs.  Please do not hesitate to reach out to me if clarification is needed.       Theadora Rama Scales, PA-C 07/11/22 1036

## 2022-07-12 ENCOUNTER — Encounter (HOSPITAL_COMMUNITY): Payer: Self-pay | Admitting: Emergency Medicine

## 2022-07-12 ENCOUNTER — Telehealth (HOSPITAL_COMMUNITY): Payer: Self-pay | Admitting: Emergency Medicine

## 2022-07-12 LAB — CERVICOVAGINAL ANCILLARY ONLY
Bacterial Vaginitis (gardnerella): POSITIVE — AB
Candida Glabrata: NEGATIVE
Candida Vaginitis: NEGATIVE
Chlamydia: NEGATIVE
Comment: NEGATIVE
Comment: NEGATIVE
Comment: NEGATIVE
Comment: NEGATIVE
Comment: NEGATIVE
Comment: NORMAL
Neisseria Gonorrhea: NEGATIVE
Trichomonas: NEGATIVE

## 2022-07-12 MED ORDER — METRONIDAZOLE 500 MG PO TABS: 500.0000 mg | ORAL_TABLET | Freq: Two times a day (BID) | ORAL | 0 refills | Status: DC

## 2022-07-13 NOTE — Telephone Encounter (Signed)
Please advise on refill request.  Allergies  Allergen Reactions   Sulfa Antibiotics    Sulfasalazine Hives    Patient tolerates NSAIDS     Current Outpatient Medications:    cetirizine (ZYRTEC) 10 MG tablet, Take 1 tablet by mouth daily., Disp: , Rfl:    ciprofloxacin (CIPRO) 250 MG tablet, Take 1 tablet (250 mg total) by mouth 2 (two) times daily for 5 days., Disp: 10 tablet, Rfl: 0   clonazePAM (KLONOPIN) 1 MG tablet, Take 1 tablet (1 mg total) by mouth 2 (two) times daily., Disp: 30 tablet, Rfl: 0   fluticasone (FLONASE) 50 MCG/ACT nasal spray, Place 1 spray into both nostrils daily. Begin by using 2 sprays in each nare daily for 3 to 5 days, then decrease to 1 spray in each nare daily., Disp: 15.8 mL, Rfl: 2   ibuprofen (ADVIL) 400 MG tablet, Take 1 tablet (400 mg total) by mouth every 8 (eight) hours as needed for up to 30 doses., Disp: 30 tablet, Rfl: 0   ipratropium (ATROVENT) 0.06 % nasal spray, Place 2 sprays into both nostrils 3 (three) times daily. As needed for nasal congestion, runny nose, Disp: 15 mL, Rfl: 1   metFORMIN (GLUCOPHAGE-XR) 500 MG 24 hr tablet, Take 500 mg by mouth 2 (two) times daily., Disp: , Rfl:    metoprolol tartrate (LOPRESSOR) 50 MG tablet, Take 1 tablet (50 mg total) by mouth 2 (two) times daily., Disp: 180 tablet, Rfl: 3   metroNIDAZOLE (FLAGYL) 500 MG tablet, Take 1 tablet (500 mg total) by mouth 2 (two) times daily., Disp: 14 tablet, Rfl: 0   Multiple Vitamin (MULTIVITAMIN) capsule, Take 1 capsule by mouth daily., Disp: , Rfl:    pregabalin (LYRICA) 50 MG capsule, Take 50 mg by mouth 3 (three) times daily., Disp: , Rfl:    Semaglutide, 2 MG/DOSE, (OZEMPIC, 2 MG/DOSE,) 8 MG/3ML SOPN, Inject 2 mg into the skin once a week., Disp: , Rfl:    topiramate (TOPAMAX) 50 MG tablet, Take 1 tablet by mouth daily., Disp: , Rfl:    traZODone (DESYREL) 150 MG tablet, Take 1 tablet (150 mg total) by mouth at bedtime., Disp: 90 tablet, Rfl: 3   Vitamin D, Ergocalciferol,  (DRISDOL) 1.25 MG (50000 UT) CAPS capsule, Take 50,000 Units by mouth every 7 (seven) days., Disp: , Rfl:

## 2022-07-13 NOTE — Telephone Encounter (Signed)
Clonazepam refilled 

## 2022-09-05 ENCOUNTER — Other Ambulatory Visit (HOSPITAL_BASED_OUTPATIENT_CLINIC_OR_DEPARTMENT_OTHER): Payer: Self-pay

## 2022-09-14 ENCOUNTER — Other Ambulatory Visit: Payer: Self-pay | Admitting: Internal Medicine

## 2022-11-07 ENCOUNTER — Other Ambulatory Visit: Payer: Self-pay

## 2022-11-07 MED ORDER — TRAZODONE HCL 150 MG PO TABS: 150.0000 mg | ORAL_TABLET | Freq: Every day | ORAL | 3 refills | Status: DC

## 2022-11-08 ENCOUNTER — Encounter: Payer: Self-pay | Admitting: Podiatry

## 2022-11-09 ENCOUNTER — Other Ambulatory Visit: Payer: Self-pay

## 2022-11-09 ENCOUNTER — Ambulatory Visit
Admission: RE | Admit: 2022-11-09 | Discharge: 2022-11-09 | Disposition: A | Discharge status: Home / Self Care | Payer: BC Managed Care – PPO | Source: Ambulatory Visit | Attending: Podiatry | Admitting: Podiatry

## 2022-11-09 DIAGNOSIS — M76829 Posterior tibial tendinitis, unspecified leg: Secondary | ICD-10-CM

## 2022-11-16 ENCOUNTER — Telehealth: Payer: Self-pay | Admitting: *Deleted

## 2022-11-16 NOTE — Telephone Encounter (Signed)
Called patient to remind her to bring her CPAP machine to her visit tomorrow at 9:30am with Amy, however patient voicemail was not set up to leave a message.   Patient is active in my chart, however I do not see that she has used my chart.

## 2022-11-16 NOTE — Progress Notes (Unsigned)
PATIENT: Christy Holland DOB: 21-Feb-1971  REASON FOR VISIT: follow up HISTORY FROM: patient  No chief complaint on file.    HISTORY OF PRESENT ILLNESS:  11/16/22 ALL: Christy Holland returns for follow up for OSA on CPAP and insomnia.   She continues trazodone 150mg  QHS.   11/17/2021 ALL: Christy Holland returns for follow up for OSA on CPAP. Her download below demonstrates that she is wearing her CPAP for 25 of the 30 days for a compliance of 83.3%. She wears the machine for >4 hours 80% of the time. Patient reports that she continues to get some sinus congestion and believes it's from the CPAP. She reports that she is cleaning the machine regularly.  She endorses that she continues to feel that she in not well rested or sleeping throughout the night. She was previously taking trazodone, she found this helpful for her. Will consider restarting this. Referral was placed to psychiatry for concerns of psychophysiologic insomnia. She has not scheduled an appt.   Patient states that she has been established with [facility].    02/11/2021 ALL: Christy Holland returns for follow up for OSA on CPAP and insomnia. She continues fairly regular use of CPAP therapy. She also continues trazodone 150mg  daily. She continued to have difficulty sleeping and was encouraged to establish care with PCP and to consider psychiatry referral for management of anxiety. She has not yet scheduled an appt with PCP. She tried to get an appt with psychiatry but was having a hard time finding someone to take her insurance. She feels that she is doing ok. Still having difficulty with sleep. No concerns with CPAP. Trazodone is helping.   Compliance report dated 01/11/2021-02/10/2021 revelas that she used CPAP 25/30 days. 4 hour compliance was 77.4%. Residual AHI was 2.4/hr on 5-11cmH20. No significant leak noted.   11/12/2020 ALL:  Christy Holland returns for follow up on OSA and insomnia. We increased trazodone to 150mg  at  bedtime in 07/2020. It helps to get her to sleep but continuing to have trouble staying asleep. She usually has to use the bathroom and then has difficulty getting back to sleep. She gets 3-4 ours each night. She feels that her mind starts working and she can't return to sleep. She continues to care for her mother who is ill. She travels to [facility] often to take care of her. Sometimes she does not take her CPAP machine with her. She denies any concerns with CPAP machine or supplies. No difficulty with tolerability of therapy.   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 Cardiology workup showed 30% PVC burden but no other significant findings. She was started on flecainide and metoprolol but discontinued these medications several months ago. She is uncertain why, She does not have PCP. Last labs in 2018.   Compliance report dated 10/03/2020 through 11/03/2020 reveals that she used CPAP 20 one of the past 30 days for compliance of 65.6%.  She used CPAP greater than 4 hours 59.4%.  Residual AHI was 2.7 on 5 to 11 cm of water.  There was no significant leak noted.  08/10/2020 ALL: Christy Holland is a 52 y.o. female here today for follow up for OSA on CPAP. She has continues intermittent use of CPAP therapy. She continues trazodone 100mg  for insomnia. She feels that she is able to go to sleep but unable to stay asleep. She usually has to use the restroom and is unable to get back to  sleep. She usually takes trazodone around 9:30, goes to bed around 10pm. Sometimes she may fall asleep watching TV, sometimes not. She usually wakes around 2am and is awake for about an hour.  Sometimes she may drift back off to sleep around 3 AM and then wakes at 5:30 AM.  She does not feel anxious or depressed throughout the day.  She admits to inconsistent sleep patterns as her mother is ill.  She has been traveling to [facility] to help take care of her.  She has not been using CPAP when traveling.  Compliance  report dated 07/06/2020 through 08/04/2020 reveals that she used CPAP 19 of the past 30 days for compliance of 63%.  She is CPAP greater than 4 hours 53% of the time.  Residual AHI was 2.6 on 5 to 11 cm of water.  No significant leak noted.  HISTORY: (copied from my note on 02/05/2020)  Christy Holland is a 52 y.o. female here today for follow up.  She is doing well with CPAP therapy.  She is using her machine every night.  She continues to have difficulty with insomnia.  We started her on trazodone 50 mg at bedtime.  This has been helpful in helping her get to sleep but she continues to have trouble staying asleep.  She is interested in increasing the dose.  She has tried Benadryl and melatonin in the past with no relief.  She does not feel that anxiety or depression are contributing but is followed closely by her primary care provider.   Compliance report dated 01/06/2020 through 02/04/2020 reveals that she used CPAP 30 of the past 30 days for compliance of 100%.  She used CPAP greater than 4 hours 73.3% of the time.  Average usage was 4 hours and 59 minutes.  Residual AHI was 3.8 on 5 to 11 cm of water pressure and an EPR of 3.  There was no significant leak noted.   HISTORY: (copied from my note on 11/07/2019)   Christy Holland is a 52 y.o. female here today for follow up for OSA on CPAP.  She reports that she is doing fairly well on CPAP therapy.  She is working towards nightly use and meeting the 4-hour recommendation.  She denies difficulty with CPAP machine.  She does continue to note insomnia.  She has tried over-the-counter options including melatonin and Benadryl with no success.   Compliance report dated 10/08/2019 through 11/06/2019 reveals that she used CPAP 28 of the last 30 days for compliance of 93.3%.  She used CPAP greater than 4 hours 83% of time.  Average usage was 5 hours and 27 minutes.  Residual AHI was 3.8 on 5 to 11 cm of water and an EPR of 3.  There was no significant leak  noted.   REVIEW OF SYSTEMS: Out of a complete 14 system review of symptoms, the patient complains only of the following symptoms, insomnia and all other reviewed systems are negative. Sinus congestions Insomnia  ESS: 14   ALLERGIES: Allergies  Allergen Reactions   Sulfa Antibiotics    Sulfasalazine Hives    Patient tolerates NSAIDS    HOME MEDICATIONS: Outpatient Medications Prior to Visit  Medication Sig Dispense Refill   cetirizine (ZYRTEC) 10 MG tablet Take 1 tablet by mouth daily.     clonazePAM (KLONOPIN) 0.5 MG tablet TAKE 1 TO 4 TABLETS BY MOUTH FOR SLEEP AS NEEDED 90 tablet 5   clonazePAM (KLONOPIN) 1 MG tablet Take 1 tablet (1 mg total)  by mouth 2 (two) times daily. 30 tablet 0   fluticasone (FLONASE) 50 MCG/ACT nasal spray Place 1 spray into both nostrils daily. Begin by using 2 sprays in each nare daily for 3 to 5 days, then decrease to 1 spray in each nare daily. 15.8 mL 2   ibuprofen (ADVIL) 400 MG tablet Take 1 tablet (400 mg total) by mouth every 8 (eight) hours as needed for up to 30 doses. 30 tablet 0   ipratropium (ATROVENT) 0.06 % nasal spray Place 2 sprays into both nostrils 3 (three) times daily. As needed for nasal congestion, runny nose 15 mL 1   metFORMIN (GLUCOPHAGE-XR) 500 MG 24 hr tablet Take 500 mg by mouth 2 (two) times daily.     metoprolol tartrate (LOPRESSOR) 50 MG tablet TAKE 1 TABLET(50 MG) BY MOUTH TWICE DAILY 180 tablet 3   metroNIDAZOLE (FLAGYL) 500 MG tablet Take 1 tablet (500 mg total) by mouth 2 (two) times daily. 14 tablet 0   Multiple Vitamin (MULTIVITAMIN) capsule Take 1 capsule by mouth daily.     pregabalin (LYRICA) 50 MG capsule Take 50 mg by mouth 3 (three) times daily.     Semaglutide, 2 MG/DOSE, (OZEMPIC, 2 MG/DOSE,) 8 MG/3ML SOPN Inject 2 mg into the skin once a week.     topiramate (TOPAMAX) 50 MG tablet Take 1 tablet by mouth daily.     traZODone (DESYREL) 150 MG tablet Take 1 tablet (150 mg total) by mouth at bedtime. 90 tablet 3    Vitamin D, Ergocalciferol, (DRISDOL) 1.25 MG (50000 UT) CAPS capsule Take 50,000 Units by mouth every 7 (seven) days.     No facility-administered medications prior to visit.    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   Rhinitis, chronic 08/21/2017   Sinusitis 02/13/2018   Snoring 08/21/2017    PAST SURGICAL HISTORY: Past Surgical History:  Procedure Laterality Date   PVC ABLATION N/A 06/18/2021   Procedure: PVC ABLATION;  Surgeon: Marinus Maw, MD;  Location: [facility];  Service: Cardiovascular;  Laterality: N/A;    FAMILY HISTORY: Family History  Problem Relation Age of Onset   Hypertension Mother    Hyperlipidemia Mother    Diabetes Mother    Hypertension Father    Hyperlipidemia Father    Diabetes Father    Heart attack Father 20   Stroke Father     SOCIAL HISTORY: Social History   Socioeconomic History   Marital status: Married    Spouse name: Not on file   Number of children: 3   Years of education: Not on file   Highest education level: Not on file  Occupational History   Not on file  Tobacco Use   Smoking status: Never   Smokeless tobacco: Never  Vaping Use   Vaping Use: Never used  Substance and Sexual Activity   Alcohol use: No    Alcohol/week: 0.0 standard drinks of alcohol   Drug use: No   Sexual activity: Yes    Birth control/protection: Condom  Other Topics Concern   Not on file  Social History Narrative   ** Merged History Encounter **       Social Determinants of Health   Financial Resource Strain: Not on file  Food Insecurity: Not on file  Transportation Needs: Not on file  Physical Activity: Not  on file  Stress: Not on file  Social Connections: Not on file  Intimate Partner Violence: Not on file     PHYSICAL EXAM  There were no vitals filed for  this visit.   There is no height or weight on file to calculate BMI.  Generalized: Well developed, in no acute distress  Cardiology: normal rate and rhythm, PVC's, no murmur noted Respiratory: clear to auscultation bilaterally  Neurological examination  Mentation: Alert oriented to time, place, history taking. Follows all commands speech and language fluent Cranial nerve II-XII: Pupils were equal round reactive to light. Extraocular movements were full, visual field were full  Motor: The motor testing reveals 5 over 5 strength of all 4 extremities. Good symmetric motor tone is noted throughout.  Gait and station: Gait is normal.    DIAGNOSTIC DATA (LABS, IMAGING, TESTING) - I reviewed patient records, labs, notes, testing and imaging myself where available.      No data to display           Lab Results  Component Value Date   WBC 5.4 06/10/2021   HGB 13.0 06/10/2021   HCT 38.8 06/10/2021   MCV 89 06/10/2021   PLT 196 06/10/2021      Component Value Date/Time   NA 140 06/10/2021 1248   K 4.2 06/10/2021 1248   CL 102 06/10/2021 1248   CO2 29 06/10/2021 1248   GLUCOSE 101 (H) 06/10/2021 1248   BUN 7 06/10/2021 1248   CREATININE 0.77 06/10/2021 1248   CALCIUM 9.4 06/10/2021 1248   PROT 6.9 07/31/2017 1712   ALBUMIN 4.2 07/31/2017 1712   AST 26 07/31/2017 1712   ALT 11 07/31/2017 1712   ALKPHOS 63 07/31/2017 1712   BILITOT <0.2 07/31/2017 1712   GFRNONAA 90 07/31/2017 1712   GFRAA 104 07/31/2017 1712   No results found for: "CHOL", "HDL", "LDLCALC", "LDLDIRECT", "TRIG", "CHOLHDL" No results found for: "HGBA1C" No results found for: "VITAMINB12" Lab Results  Component Value Date   TSH 1.450 07/31/2017     ASSESSMENT AND PLAN 52 y.o. year old female  has a past medical history of Chronic maxillary sinusitis (08/21/2017), Frequent PVCs, Holter monitor, abnormal (08/22/2019), Hypertrophy of tonsil and adenoid (08/21/2017), Rhinitis, chronic (08/21/2017), Sinusitis  (02/13/2018), and Snoring (08/21/2017). here with   No diagnosis found.   Christy Holland is doing much better with compliance. Current download shows optimal compliance with daily and 4 hour use. We will update supply orders as indicated. Risks of untreated sleep apnea review and education materials provided.  She continues to struggle with insomnia.  She does feel that trazodone was helping. We will restart trazodone 150 mg every night about 30 to 45 minutes before bedtime. We have reviewed sleep hygiene and I have encouraged her to work on these at home. Encouraged her to reach out to psychiatry to follow up on the referral. She was provided contact information for local psychiatry providers. Healthy lifestyle habits encouraged. She will follow up in 12 months, sooner if needed. She verbalizes understanding and agreement with this plan.    No orders of the defined types were placed in this encounter.    No orders of the defined types were placed in this encounter.      Debbora Presto, FNP-C 11/16/2022, 4:16 PM [facility] [address] [place], [place] [address] [phone]

## 2022-11-16 NOTE — Patient Instructions (Incomplete)
Please continue using your CPAP regularly. While your insurance requires that you use CPAP at least 4 hours each night on 70% of the nights, I recommend, that you not skip any nights and use it throughout the night if you can. Getting used to CPAP and staying with the treatment long term does take time and patience and discipline. Untreated obstructive sleep apnea when it is moderate to severe can have an adverse impact on cardiovascular health and raise her risk for heart disease, arrhythmias, hypertension, congestive heart failure, stroke and diabetes. Untreated obstructive sleep apnea causes sleep disruption, nonrestorative sleep, and sleep deprivation. This can have an impact on your day to day functioning and cause daytime sleepiness and impairment of cognitive function, memory loss, mood disturbance, and problems focussing. Using CPAP regularly can improve these symptoms.  Continue trazodone 150mg  daily at bedtime. We will refer you to psychology for insomnia. Please discuss any concerns of depression with your primary care provider. We have referred you to psychiatry in the past prior to you being established with a primary care provider. We can resend referral, however, I recommend starting discussion with your PCP and see if there are treatment options that do not require you see a specialist. If depression worsens or if you have any concerns of suicidal ideations, please seek emergency medical attention.   Follow up in 1 year

## 2022-11-17 ENCOUNTER — Encounter: Payer: Self-pay | Admitting: Family Medicine

## 2022-11-17 ENCOUNTER — Ambulatory Visit (INDEPENDENT_AMBULATORY_CARE_PROVIDER_SITE_OTHER): Payer: BC Managed Care – PPO | Admitting: Family Medicine

## 2022-11-17 ENCOUNTER — Telehealth: Payer: Self-pay | Admitting: Family Medicine

## 2022-11-17 VITALS — BP 120/71 | HR 80

## 2022-11-17 DIAGNOSIS — F418 Other specified anxiety disorders: Secondary | ICD-10-CM

## 2022-11-17 DIAGNOSIS — G4700 Insomnia, unspecified: Secondary | ICD-10-CM

## 2022-11-17 DIAGNOSIS — G4733 Obstructive sleep apnea (adult) (pediatric): Secondary | ICD-10-CM

## 2022-11-17 MED ORDER — TRAZODONE HCL 150 MG PO TABS: 150.0000 mg | ORAL_TABLET | Freq: Every day | ORAL | 3 refills | Status: DC

## 2022-11-17 NOTE — Telephone Encounter (Signed)
Referral for psychology sent through [facility] to [facility]. Phone: [phone]

## 2022-11-18 ENCOUNTER — Encounter: Payer: Self-pay | Admitting: Podiatry

## 2022-11-18 ENCOUNTER — Ambulatory Visit (INDEPENDENT_AMBULATORY_CARE_PROVIDER_SITE_OTHER): Payer: BC Managed Care – PPO | Admitting: Podiatry

## 2022-11-18 DIAGNOSIS — M722 Plantar fascial fibromatosis: Secondary | ICD-10-CM

## 2022-11-18 DIAGNOSIS — G5751 Tarsal tunnel syndrome, right lower limb: Secondary | ICD-10-CM

## 2022-11-18 DIAGNOSIS — M76829 Posterior tibial tendinitis, unspecified leg: Secondary | ICD-10-CM

## 2022-11-18 MED ORDER — METHYLPREDNISOLONE 4 MG PO TBPK: ORAL_TABLET | ORAL | 0 refills | Status: DC

## 2022-11-18 NOTE — Progress Notes (Unsigned)
Subjective: Chief Complaint  Patient presents with   Results    MRI results - follow up posterior tibial tendonitis bilateral   "They are actually worse. Also, he gave me something to go between my toes, and could I get more"    52 year old female presents to the office for follow-up evaluation.  She states that her symptoms have worsened since she is seen last appointment and she recently had an MRI.  She presents to discuss the results.  She does get nerve type symptoms into the foot.  Objective: AAO x3, NAD DP/PT pulses palpable bilaterally, CRT less than 3 seconds There is still tenderness palpation on the plantar medial tubercle of the parakeets at insertion of plantar fascia. There is moderate tenderness that appears to be along the course the posterior tibial tendon left than right today.  There is a positive Tinel's sign on the right and left side today.  Decreased medial arch height.  Bunion still present. 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  -Prescribed Medrol Dosepak -Given the positive Tinel's sign still bilaterally I would like to rule out any tarsal tunnel or other nerve issues.  Will order nerve conduction test. -Physical therapy prescription [facility] for [facility]. -Continue shoes and good arch supports. -Toe sperateor left for bunion.  Continue offloading. -Patient encouraged to call the office with any questions, concerns, change in symptoms.   Trula Slade DPM

## 2022-11-24 ENCOUNTER — Encounter: Payer: Self-pay | Admitting: Psychology

## 2022-11-25 ENCOUNTER — Ambulatory Visit
Admission: EM | Admit: 2022-11-25 | Discharge: 2022-11-25 | Disposition: A | Discharge status: Home / Self Care | Payer: BC Managed Care – PPO | Attending: Urgent Care | Admitting: Urgent Care

## 2022-11-25 DIAGNOSIS — Z1152 Encounter for screening for COVID-19: Secondary | ICD-10-CM | POA: Insufficient documentation

## 2022-11-25 DIAGNOSIS — A084 Viral intestinal infection, unspecified: Secondary | ICD-10-CM | POA: Diagnosis not present

## 2022-11-25 DIAGNOSIS — R112 Nausea with vomiting, unspecified: Secondary | ICD-10-CM

## 2022-11-25 DIAGNOSIS — J310 Chronic rhinitis: Secondary | ICD-10-CM | POA: Diagnosis present

## 2022-11-25 DIAGNOSIS — R197 Diarrhea, unspecified: Secondary | ICD-10-CM

## 2022-11-25 DIAGNOSIS — B349 Viral infection, unspecified: Secondary | ICD-10-CM

## 2022-11-25 MED ORDER — PROMETHAZINE-DM 6.25-15 MG/5ML PO SYRP: 5.0000 mL | ORAL_SOLUTION | Freq: Three times a day (TID) | ORAL | 0 refills | Status: DC | PRN

## 2022-11-25 MED ORDER — ONDANSETRON 8 MG PO TBDP: 8.0000 mg | ORAL_TABLET | Freq: Three times a day (TID) | ORAL | 0 refills | Status: DC | PRN

## 2022-11-25 MED ORDER — PREDNISONE 20 MG PO TABS: ORAL_TABLET | ORAL | 0 refills | Status: DC

## 2022-11-25 MED ORDER — PAXLOVID (300/100) 20 X 150 MG & 10 X 100MG PO TBPK: ORAL_TABLET | ORAL | 0 refills | Status: DC

## 2022-11-25 NOTE — Discharge Instructions (Addendum)
We will notify you of your test results as they arrive and may take between about 24 hours.  I encourage you to sign up for MyChart if you have not already done so as this can be the easiest way for [place] to communicate results to you online or through a phone app.  Generally, we only contact you if it is a positive test result.  In the meantime, if you develop worsening symptoms including fever, chest pain, shortness of breath despite our current treatment plan then please report to the emergency room as this may be a sign of worsening status from possible viral infection.  Otherwise, we will manage this as a viral syndrome. For sore throat or cough try using a honey-based tea. Use 3 teaspoons of honey with juice squeezed from half lemon. Place shaved pieces of ginger into 1/2-1 cup of water and warm over stove top. Then mix the ingredients and repeat every 4 hours as needed. Please take Tylenol 500mg -650mg  every 6 hours for aches and pains, fevers. Hydrate very well with at least 2 liters of water. Eat light meals such as soups to replenish electrolytes and soft fruits, veggies. Keep taking Zyrtec for postnasal drainage, sinus congestion.  You can take this together with prednisone.  Use the cough medications as needed.  Keep taking Imodium to 4 times daily at 2mg . Take together with Zofran.   If you see a positive COVID test result in mychart, fill in the prescription for Paxlovid. Hold off on using clonazepam and take only half the dose of trazodone if you start Paxlovid.

## 2022-11-25 NOTE — ED Triage Notes (Signed)
Pt c/o n/v/d, HA, sinus pressure x 4 days-NAD-steady gait

## 2022-11-25 NOTE — ED Provider Notes (Signed)
[facility]  Note:  This document was prepared using Systems analyst and may include unintentional dictation errors.  MRN: [id number] DOB: June 26, 1971  Subjective:   Christy Holland is a 52 y.o. female presenting for 4-day history of acute onset persistent nausea, vomiting, diarrhea, abdominal cramping, sinus pressure, sinus headaches, sinus drainage, productive cough.  Has chronic rhinitis, takes an antihistamine for this daily.  No chest pain, shortness of breath or wheezing, hemoptysis.  No fever, bloody stools, recent antibiotic use, hospitalizations or long distance travel.  Has not eaten raw foods, drank unfiltered water.  No history of GI disorders including Crohn's, IBS, ulcerative colitis.  No history of respiratory disorders including asthma.  Patient is not a smoker.  No current facility-administered medications for this encounter.  Current Outpatient Medications:    cetirizine (ZYRTEC) 10 MG tablet, Take 1 tablet by mouth daily., Disp: , Rfl:    clonazePAM (KLONOPIN) 0.5 MG tablet, TAKE 1 TO 4 TABLETS BY MOUTH FOR SLEEP AS NEEDED, Disp: 90 tablet, Rfl: 5   clonazePAM (KLONOPIN) 1 MG tablet, Take 1 tablet (1 mg total) by mouth 2 (two) times daily., Disp: 30 tablet, Rfl: 0   DULoxetine (CYMBALTA) 30 MG capsule, TAKE 1 CAPSULE(30 MG) BY MOUTH DAILY, Disp: , Rfl:    fluticasone (FLONASE) 50 MCG/ACT nasal spray, Place 1 spray into both nostrils daily. Begin by using 2 sprays in each nare daily for 3 to 5 days, then decrease to 1 spray in each nare daily., Disp: 15.8 mL, Rfl: 2   ibuprofen (ADVIL) 400 MG tablet, Take 1 tablet (400 mg total) by mouth every 8 (eight) hours as needed for up to 30 doses., Disp: 30 tablet, Rfl: 0   ipratropium (ATROVENT) 0.06 % nasal spray, Place 2 sprays into both nostrils 3 (three) times daily. As needed for nasal congestion, runny nose, Disp: 15 mL, Rfl: 1   metFORMIN (GLUCOPHAGE-XR) 500 MG 24 hr tablet, Take 500  mg by mouth 2 (two) times daily., Disp: , Rfl:    methylPREDNISolone (MEDROL DOSEPAK) 4 MG TBPK tablet, Take as directed, Disp: 21 tablet, Rfl: 0   metoprolol tartrate (LOPRESSOR) 50 MG tablet, TAKE 1 TABLET(50 MG) BY MOUTH TWICE DAILY, Disp: 180 tablet, Rfl: 3   metroNIDAZOLE (FLAGYL) 500 MG tablet, Take 1 tablet (500 mg total) by mouth 2 (two) times daily. (Patient not taking: Reported on 11/17/2022), Disp: 14 tablet, Rfl: 0   Multiple Vitamin (MULTIVITAMIN) capsule, Take 1 capsule by mouth daily., Disp: , Rfl:    MYRBETRIQ 25 MG TB24 tablet, Take 25 mg by mouth daily., Disp: , Rfl:    pregabalin (LYRICA) 50 MG capsule, Take 50 mg by mouth 3 (three) times daily., Disp: , Rfl:    Semaglutide, 2 MG/DOSE, (OZEMPIC, 2 MG/DOSE,) 8 MG/3ML SOPN, Inject 2 mg into the skin once a week., Disp: , Rfl:    spironolactone (ALDACTONE) 50 MG tablet, Take 50 mg by mouth 2 (two) times daily., Disp: , Rfl:    topiramate (TOPAMAX) 50 MG tablet, Take 1 tablet by mouth daily., Disp: , Rfl:    traZODone (DESYREL) 150 MG tablet, Take 1 tablet (150 mg total) by mouth at bedtime., Disp: 90 tablet, Rfl: 3   Vitamin D, Ergocalciferol, (DRISDOL) 1.25 MG (50000 UT) CAPS capsule, Take 50,000 Units by mouth every 7 (seven) days., Disp: , Rfl:     Allergies  Allergen Reactions   Sulfamethoxazole-Trimethoprim Rash   Lactose Nausea And Vomiting and Other (See  Comments)   Sulfa Antibiotics    Sulfasalazine Hives    Patient tolerates NSAIDS    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/2017   Rhinitis, chronic 08/21/2017   Sinusitis 02/13/2018   Snoring 08/21/2017     Past Surgical History:  Procedure Laterality Date   PVC ABLATION N/A 06/18/2021   Procedure: PVC ABLATION;  Surgeon: Evans Lance, MD;  Location: [facility];  Service: Cardiovascular;  Laterality: N/A;     Family History  Problem Relation Age of Onset   Hypertension Mother    Hyperlipidemia Mother    Diabetes Mother    Hypertension Father    Hyperlipidemia Father    Diabetes Father    Heart attack Father 18   Stroke Father     Social History   Tobacco Use   Smoking status: Never   Smokeless tobacco: Never  Vaping Use   Vaping Use: Never used  Substance Use Topics   Alcohol use: No    Alcohol/week: 0.0 standard drinks of alcohol   Drug use: No    ROS   Objective:   Vitals: BP 108/76 (BP Location: Right Arm)   Pulse 88   Temp 98 F (36.7 C) (Oral)   Resp 20   LMP 10/24/2022   SpO2 95%   Physical Exam Constitutional:      General: She is not in acute distress.    Appearance: Normal appearance. She is well-developed and normal weight. She is not ill-appearing, toxic-appearing or diaphoretic.  HENT:     Head: Normocephalic and atraumatic.     Right Ear: Tympanic membrane, ear canal and external ear normal. No drainage or tenderness. No middle ear effusion. There is no impacted cerumen. Tympanic membrane is not erythematous or bulging.     Left Ear: Tympanic membrane, ear canal and external ear normal. No drainage or tenderness.  No middle ear effusion. There is no impacted cerumen. Tympanic membrane is not erythematous or bulging.     Nose: Congestion present. No rhinorrhea.     Mouth/Throat:     Mouth: Mucous membranes are moist. No oral lesions.     Pharynx: No pharyngeal swelling, oropharyngeal exudate, posterior oropharyngeal erythema or uvula swelling.     Tonsils: No tonsillar exudate or tonsillar abscesses.     Comments: Thick wall of postnasal drainage overlying pharynx. Eyes:     General: No scleral icterus.       Right eye: No discharge.        Left eye: No discharge.     Extraocular Movements: Extraocular movements intact.     Right eye: Normal extraocular motion.     Left eye: Normal extraocular motion.     Conjunctiva/sclera: Conjunctivae  normal.  Cardiovascular:     Rate and Rhythm: Normal rate and regular rhythm.     Heart sounds: Normal heart sounds. No murmur heard.    No friction rub. No gallop.  Pulmonary:     Effort: Pulmonary effort is normal. No respiratory distress.     Breath sounds: No stridor. No wheezing, rhonchi or rales.  Chest:     Chest wall: No tenderness.  Abdominal:     General: Bowel sounds are increased. There is no distension.     Palpations: Abdomen is soft. There is no mass.     Tenderness: There is no abdominal tenderness.  There is no right CVA tenderness, left CVA tenderness, guarding or rebound.  Musculoskeletal:     Cervical back: Normal range of motion and neck supple.  Lymphadenopathy:     Cervical: No cervical adenopathy.  Skin:    General: Skin is warm and dry.  Neurological:     General: No focal deficit present.     Mental Status: She is alert and oriented to person, place, and time.  Psychiatric:        Mood and Affect: Mood normal.        Behavior: Behavior normal.        Thought Content: Thought content normal.        Judgment: Judgment normal.     Assessment and Plan :   PDMP not reviewed this encounter.  1. Acute viral syndrome   2. Chronic rhinitis   3. Viral gastroenteritis   4. Nausea vomiting and diarrhea     Deferred imaging given clear cardiopulmonary exam, hemodynamically stable vital signs. Does not meet [facility] criteria for strep testing.  Given her chronic rhinitis, recommended oral prednisone course.  Will manage for viral illness such as viral URI, viral syndrome, viral rhinitis, COVID-19, viral gastroenteritis. Recommended supportive care. Offered scripts for symptomatic relief. Testing is pending. Counseled patient on potential for adverse effects with medications prescribed/recommended today, ER and return-to-clinic precautions discussed, patient verbalized understanding.   If her COVID test is positive tomorrow, patient is to take the prescription for  Paxlovid.  Hold clonazepam, use only half the dose trazodone as needed.   Jaynee Eagles, [place] 11/25/22 [phone]

## 2022-11-26 LAB — SARS CORONAVIRUS 2 (TAT 6-24 HRS): SARS Coronavirus 2: NEGATIVE

## 2023-01-02 ENCOUNTER — Ambulatory Visit: Payer: BC Managed Care – PPO | Admitting: Podiatry

## 2023-03-27 ENCOUNTER — Other Ambulatory Visit: Payer: Self-pay

## 2023-03-27 ENCOUNTER — Encounter (HOSPITAL_BASED_OUTPATIENT_CLINIC_OR_DEPARTMENT_OTHER): Payer: Self-pay

## 2023-03-27 ENCOUNTER — Emergency Department (HOSPITAL_BASED_OUTPATIENT_CLINIC_OR_DEPARTMENT_OTHER): Payer: BC Managed Care – PPO

## 2023-03-27 DIAGNOSIS — Z794 Long term (current) use of insulin: Secondary | ICD-10-CM | POA: Diagnosis not present

## 2023-03-27 DIAGNOSIS — I808 Phlebitis and thrombophlebitis of other sites: Secondary | ICD-10-CM | POA: Insufficient documentation

## 2023-03-27 DIAGNOSIS — M7989 Other specified soft tissue disorders: Secondary | ICD-10-CM | POA: Diagnosis present

## 2023-03-27 NOTE — ED Triage Notes (Signed)
Patient here POV from Home.  Endorses having Multiple PIV's in Right Forearm. Discharged 1.5 Weeks ago and then noted Right Forearm Swelling for about a week.   No Known Fevers. Area is Warm.  NAD Noted during Triage. A&Ox4. Gcs 15. Ambulatory.

## 2023-03-28 ENCOUNTER — Emergency Department (HOSPITAL_BASED_OUTPATIENT_CLINIC_OR_DEPARTMENT_OTHER)
Admission: EM | Admit: 2023-03-28 | Discharge: 2023-03-28 | Disposition: A | Discharge status: Home / Self Care | Payer: BC Managed Care – PPO | Attending: Emergency Medicine | Admitting: Emergency Medicine

## 2023-03-28 ENCOUNTER — Telehealth: Payer: Self-pay

## 2023-03-28 DIAGNOSIS — I808 Phlebitis and thrombophlebitis of other sites: Secondary | ICD-10-CM

## 2023-03-28 MED ORDER — NAPROXEN 500 MG PO TABS: 500.0000 mg | ORAL_TABLET | Freq: Two times a day (BID) | ORAL | 0 refills | Status: DC

## 2023-03-28 MED ORDER — NAPROXEN 250 MG PO TABS
500.0000 mg | ORAL_TABLET | Freq: Once | ORAL | Status: AC
  Administered 2023-03-28: 500 mg via ORAL
  Filled 2023-03-28: qty 2

## 2023-03-28 NOTE — Telephone Encounter (Signed)
Mrs. Margiotta has to be called please call her asap!

## 2023-03-28 NOTE — Discharge Instructions (Addendum)
Please discuss naproxen use with your Bariatric Surgeon before taking regularly.

## 2023-03-28 NOTE — Telephone Encounter (Signed)
Pt called back per message received in [facility] Triage.    Pt recently seen in ER, 03/28/23 morning.    Pt 1. Needed a DMV parking placard form completed, but stated Dr. Terald Sleeper office is working on this for her, and did not need [facility]'s help with this request.   2.  Pt wanted Dr. [name] to review her med list s/p ER visit.  Pt advised ER provider was an MD, and prescribed naproxen for pain control, and is safe to take per his assessment.  Pt advised I would honor her request, and forward her med questions to Dr. [name] for review, but would not hear anything until next week.  Dr. [name] is out of the office on vacation.    Pt verbalized understanding, and appreciated my call back. No follow up required at this time.

## 2023-03-28 NOTE — Telephone Encounter (Signed)
Pt  came into the office to get an updated handicap form. Her last visit with you was in January. Please let contact her either phone or mychart to let her know if she needs to see or can just pick it up.

## 2023-03-28 NOTE — ED Notes (Signed)
RN reviewed discharge instructions with pt. Pt verbalized understanding and had no further questions. VSS upon discharge.  

## 2023-03-28 NOTE — ED Provider Notes (Signed)
[facility] EMER[facility] DEPARTMENT AT [facility]  Provider Note  CSN: [id number] Arrival date & time: 03/27/23 2056  History Chief Complaint  Patient presents with   Arm Swelling    Christy Holland is a 52 y.o. female was admitted at [facility] last month for bariatric surgery, she reports she has had some knots and swelling of R forearm at the site of IV during that admission. No proximal swelling, no CP SOB. No fever.    Home Medications Prior to Admission medications   Medication Sig Start Date End Date Taking? Authorizing Provider  naproxen (NAPROSYN) 500 MG tablet Take 1 tablet (500 mg total) by mouth 2 (two) times daily. 03/28/23  Yes Pollyann Savoy, MD  cetirizine (ZYRTEC) 10 MG tablet Take 1 tablet by mouth daily. 01/21/22   [provider]  clonazePAM (KLONOPIN) 0.5 MG tablet TAKE 1 TO 4 TABLETS BY MOUTH FOR SLEEP AS NEEDED 07/13/22   Young, Rennis Chris, MD  clonazePAM (KLONOPIN) 1 MG tablet Take 1 tablet (1 mg total) by mouth 2 (two) times daily. 11/12/21   Jetty Duhamel D, MD  DULoxetine (CYMBALTA) 30 MG capsule TAKE 1 CAPSULE(30 MG) BY MOUTH DAILY 05/02/22   [provider]  fluticasone (FLONASE) 50 MCG/ACT nasal spray Place 1 spray into both nostrils daily. Begin by using 2 sprays in each nare daily for 3 to 5 days, then decrease to 1 spray in each nare daily. 07/11/22   Theadora Rama Scales, PA-C  ibuprofen (ADVIL) 400 MG tablet Take 1 tablet (400 mg total) by mouth every 8 (eight) hours as needed for up to 30 doses. 07/11/22   Theadora Rama Scales, PA-C  ipratropium (ATROVENT) 0.06 % nasal spray Place 2 sprays into both nostrils 3 (three) times daily. As needed for nasal congestion, runny nose 07/11/22   Theadora Rama Scales, PA-C  metFORMIN (GLUCOPHAGE-XR) 500 MG 24 hr tablet Take 500 mg by mouth 2 (two) times daily. 11/02/21   [provider]  metoprolol tartrate (LOPRESSOR) 50 MG tablet TAKE 1 TABLET(50 MG) BY MOUTH TWICE DAILY 09/19/22    Marinus Maw, MD  metroNIDAZOLE (FLAGYL) 500 MG tablet Take 1 tablet (500 mg total) by mouth 2 (two) times daily. Patient not taking: Reported on 11/17/2022 07/12/22   Merrilee Jansky, MD  Multiple Vitamin (MULTIVITAMIN) capsule Take 1 capsule by mouth daily.    [provider]  MYRBETRIQ 25 MG TB24 tablet Take 25 mg by mouth daily.    [provider]  nirmatrelvir & ritonavir (PAXLOVID, 300/100,) 20 x 150 MG & 10 x 100MG  TBPK Take 2 tablets nirmtrelvir and 1 tablet ritonavir twice daily. 11/25/22   Wallis Bamberg, PA-C  ondansetron (ZOFRAN-ODT) 8 MG disintegrating tablet Take 1 tablet (8 mg total) by mouth every 8 (eight) hours as needed for nausea or vomiting. 11/25/22   Wallis Bamberg, PA-C  predniSONE (DELTASONE) 20 MG tablet Take 2 tablets daily with breakfast. 11/25/22   Wallis Bamberg, PA-C  pregabalin (LYRICA) 50 MG capsule Take 50 mg by mouth 3 (three) times daily. 03/15/22   [provider]  promethazine-dextromethorphan (PROMETHAZINE-DM) 6.25-15 MG/5ML syrup Take 5 mLs by mouth 3 (three) times daily as needed for cough. 11/25/22   Wallis Bamberg, PA-C  Semaglutide, 2 MG/DOSE, (OZEMPIC, 2 MG/DOSE,) 8 MG/3ML SOPN Inject 2 mg into the skin once a week. 03/30/22   [provider]  spironolactone (ALDACTONE) 50 MG tablet Take 50 mg by mouth 2 (two) times daily.    [provider]  topiramate (TOPAMAX) 50 MG tablet Take 1 tablet by mouth daily. 03/01/22   [provider]  traZODone (DESYREL) 150 MG tablet Take 1 tablet (150 mg total) by mouth at bedtime. 11/17/22   Lomax, Amy, NP  Vitamin D, Ergocalciferol, (DRISDOL) 1.25 MG (50000 UT) CAPS capsule Take 50,000 Units by mouth every 7 (seven) days. 03/29/19   [provider]     Allergies    Sulfamethoxazole-trimethoprim, Lactose, Sulfa antibiotics, and Sulfasalazine   Review of Systems   Review of Systems Please see HPI for pertinent positives and negatives  Physical Exam BP 115/78 (BP Location:  Left Arm)   Pulse 85   Temp 98.1 F (36.7 C) (Oral)   Resp 16   Ht 5\' 7"  (1.702 m)   Wt 93 kg   SpO2 100%   BMI 32.11 kg/m   Physical Exam Vitals and nursing note reviewed.  HENT:     Head: Normocephalic.     Nose: Nose normal.  Eyes:     Extraocular Movements: Extraocular movements intact.  Cardiovascular:     Pulses: Normal pulses.  Pulmonary:     Effort: Pulmonary effort is normal.  Musculoskeletal:        General: Normal range of motion.     Cervical back: Neck supple.     Comments: Cords in superficial veins of R forearm, mild warmth and induration.   Skin:    Findings: No rash (on exposed skin).  Neurological:     Mental Status: She is alert and oriented to person, place, and time.  Psychiatric:        Mood and Affect: Mood normal.     ED Results / Procedures / Treatments   EKG None  Procedures Procedures  Medications Ordered in the ED Medications  naproxen (NAPROSYN) tablet 500 mg (has no administration in time range)    Initial Impression and Plan  Patient here with suspected superficial thrombophlebitis from recent IV. I personally viewed the images from radiology studies and agree with radiologist interpretation: [place] confirms above. Recommend warm compress, ACE wrap. NSAIDs may be beneficial but advised to discuss long term use with her bariatric surgeon given recent procedure. PCP follow up, RTED for any other concerns.    ED Course       MDM Rules/Calculators/A&P Medical Decision Making Problems Addressed: Superficial thrombophlebitis of right upper extremity: acute illness or injury  Amount and/or Complexity of Data Reviewed Radiology: ordered and independent interpretation performed. Decision-making details documented in ED Course.  Risk Prescription drug management.     Final Clinical Impression(s) / ED Diagnoses Final diagnoses:  Superficial thrombophlebitis of right upper extremity    Rx / DC Orders ED Discharge Orders           Ordered    naproxen (NAPROSYN) 500 MG tablet  2 times daily        03/28/23 0121             Pollyann Savoy, MD 03/28/23 [phone]

## 2023-04-11 NOTE — Telephone Encounter (Signed)
Meds look ok to me. GT

## 2023-04-11 NOTE — Telephone Encounter (Signed)
See message.

## 2023-04-12 NOTE — Telephone Encounter (Signed)
Pt aware meds were reviewed by Dr [name] ./cy

## 2023-04-30 ENCOUNTER — Other Ambulatory Visit: Payer: Self-pay | Admitting: Internal Medicine

## 2023-05-04 NOTE — Telephone Encounter (Signed)
I have refilled clonazepam. It has been over a year since she was last seen. Please ask her to make office f/u appointment.

## 2023-05-22 NOTE — Progress Notes (Signed)
PATIENT: Christy Holland DOB: 12-Mar-1971  REASON FOR VISIT: follow up HISTORY FROM: patient  Chief Complaint  Patient presents with   Room 1    Pt is here Alone. Pt states that she has a headache today . Pt states that she has a lot of Fatigue throughout the day. Pt states that she isn't getting a lot of sleep even with using her machine due to her not being able to get comfortable when trying to sleep. Pt states that she is having headaches due to lack of sleep. Pt states that her headaches have been going on for a couple of weeks. Pt has light and sound sensitivity as well as blurry vision with headaches.      HISTORY OF PRESENT ILLNESS:  05/23/23 ALL: Christy Holland returns for follow up for OSA on CPAP and insomnia. She reports continueing CPAP therapy most every night. She does feel that it helps significantly with snoring and sleep but she continues to fight with machine. She has a difficult time getting comfortable. She continues to have trouble getting to and staying asleep. She continues trazodone 150mg  daily. She is also taking clonazepam 1-2mg  nightly written by Dr Maple Hudson. She was seen in consult with him for dyspnea 03/2021 and has continued yearly follow up. Despite taking both trazodone and clonazepam, she continues to have trouble with insomnia. We referred her to psychology 10/2022. She has an appt with Dr Kieth Brightly 07/2023. He does not routinely see insomnia.   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 She has lost about 30lbs. She is followed by [facility] for weight management.     11/17/2022 ALL: Christy Holland returns for follow up for OSA on CPAP and insomnia. She continues CPAP most nights. She is tolerating it well but admits pain has contributed to lower compliance. She denies concerns with machine or supplies.   She continues trazodone 150mg  QHS. She feels that it helps her doze but she is not sleeping well. She is having significant pain in right shoulder, knees  and back. She is seeing orthopedics. She is planning rotator cuff repair in future. She has pinched nerves in her neck and has right arm pain and tingling. She is currently followed by NS with [facility] and getting injection therapy. PT was not helpful. She also endorses some depression that could be contributing. We referred her to psychiatry in 2022, prior to being established with PCP, but she was not seen. She is now established with Warnell Forester. She has not discussed concerns of depression with PCP.   After reviewing her chart at completion of out visit, it was noted that she was seen in follow up with Dr Maple Hudson, pulmonology, for CPAP management and insomnia. Clonazepam 0.5mg  started. PDMP shows she consistently fills 90 tablets every month.     11/17/2021 ALL: Sharrel returns for follow up for OSA on CPAP. Her download below demonstrates that she is wearing her CPAP for 25 of the 30 days for a compliance of 83.3%. She wears the machine for >4 hours 80% of the time. Patient reports that she continues to get some sinus congestion and believes it's from the CPAP. She reports that she is cleaning the machine regularly.   She endorses that she continues to feel that she in not well rested or sleeping throughout the night. She was previously taking trazodone, she found this helpful for her. Will consider restarting this. Referral was placed to psychiatry for concerns of psychophysiologic insomnia. She has not  scheduled an appt.   Patient states that she has been established with a PCP at [facility].     02/11/2021 ALL: Christy Holland returns for follow up for OSA on CPAP and insomnia. She continues fairly regular use of CPAP therapy. She also continues trazodone 150mg  daily. She continued to have difficulty sleeping and was encouraged to establish care with PCP and to consider psychiatry referral for management of anxiety. She has not yet scheduled an appt with PCP. She tried to get an appt with  psychiatry but was having a hard time finding someone to take her insurance. She feels that she is doing ok. Still having difficulty with sleep. No concerns with CPAP. Trazodone is helping.   Compliance report dated 01/11/2021-02/10/2021 revelas that she used CPAP 25/30 days. 4 hour compliance was 77.4%. Residual AHI was 2.4/hr on 5-11cmH20. No significant leak noted.   11/12/2020 ALL:  Christy Holland returns for follow up on OSA and insomnia. We increased trazodone to 150mg  at bedtime in 07/2020. It helps to get her to sleep but continuing to have trouble staying asleep. She usually has to use the bathroom and then has difficulty getting back to sleep. She gets 3-4 ours each night. She feels that her mind starts working and she can't return to sleep. She continues to care for her mother who is ill. She travels to [facility] often to take care of her. Sometimes she does not take her CPAP machine with her. She denies any concerns with CPAP machine or supplies. No difficulty with tolerability of therapy.   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 Cardiology workup showed 30% PVC burden but no other significant findings. She was started on flecainide and metoprolol but discontinued these medications several months ago. She is uncertain why, She does not have PCP. Last labs in 2018.   Compliance report dated 10/03/2020 through 11/03/2020 reveals that she used CPAP 20 one of the past 30 days for compliance of 65.6%.  She used CPAP greater than 4 hours 59.4%.  Residual AHI was 2.7 on 5 to 11 cm of water.  There was no significant leak noted.  08/10/2020 ALL: Christy Holland is a 52 y.o. female here today for follow up for OSA on CPAP. She has continues intermittent use of CPAP therapy. She continues trazodone 100mg  for insomnia. She feels that she is able to go to sleep but unable to stay asleep. She usually has to use the restroom and is unable to get back to sleep. She usually takes trazodone  around 9:30, goes to bed around 10pm. Sometimes she may fall asleep watching TV, sometimes not. She usually wakes around 2am and is awake for about an hour.  Sometimes she may drift back off to sleep around 3 AM and then wakes at 5:30 AM.  She does not feel anxious or depressed throughout the day.  She admits to inconsistent sleep patterns as her mother is ill.  She has been traveling to [facility] to help take care of her.  She has not been using CPAP when traveling.  Compliance report dated 07/06/2020 through 08/04/2020 reveals that she used CPAP 19 of the past 30 days for compliance of 63%.  She is CPAP greater than 4 hours 53% of the time.  Residual AHI was 2.6 on 5 to 11 cm of water.  No significant leak noted.  HISTORY: (copied from my note on 02/05/2020)  Tyiona Yeats is a 52 y.o. female here today for  follow up.  She is doing well with CPAP therapy.  She is using her machine every night.  She continues to have difficulty with insomnia.  We started her on trazodone 50 mg at bedtime.  This has been helpful in helping her get to sleep but she continues to have trouble staying asleep.  She is interested in increasing the dose.  She has tried Benadryl and melatonin in the past with no relief.  She does not feel that anxiety or depression are contributing but is followed closely by her primary care provider.   Compliance report dated 01/06/2020 through 02/04/2020 reveals that she used CPAP 30 of the past 30 days for compliance of 100%.  She used CPAP greater than 4 hours 73.3% of the time.  Average usage was 4 hours and 59 minutes.  Residual AHI was 3.8 on 5 to 11 cm of water pressure and an EPR of 3.  There was no significant leak noted.   HISTORY: (copied from my note on 11/07/2019)   Annamaria Vittone is a 52 y.o. female here today for follow up for OSA on CPAP.  She reports that she is doing fairly well on CPAP therapy.  She is working towards nightly use and meeting the 4-hour recommendation.  She denies  difficulty with CPAP machine.  She does continue to note insomnia.  She has tried over-the-counter options including melatonin and Benadryl with no success.   Compliance report dated 10/08/2019 through 11/06/2019 reveals that she used CPAP 28 of the last 30 days for compliance of 93.3%.  She used CPAP greater than 4 hours 83% of time.  Average usage was 5 hours and 27 minutes.  Residual AHI was 3.8 on 5 to 11 cm of water and an EPR of 3.  There was no significant leak noted.   REVIEW OF SYSTEMS: Out of a complete 14 system review of symptoms, the patient complains only of the following symptoms, insomnia, chronic pain of multiple sites and all other reviewed systems are negative. Sinus congestions Insomnia  ESS: 18/24, previously 14/24   ALLERGIES: Allergies  Allergen Reactions   Sulfamethoxazole-Trimethoprim Rash   Lactose Nausea And Vomiting and Other (See Comments)   Sulfa Antibiotics    Sulfasalazine Hives    Patient tolerates NSAIDS    HOME MEDICATIONS: Outpatient Medications Prior to Visit  Medication Sig Dispense Refill   clonazePAM (KLONOPIN) 0.5 MG tablet TAKE 1 TO 4 TABLETS BY MOUTH FOR SLEEP AS NEEDED 90 tablet 5   clonazePAM (KLONOPIN) 1 MG tablet TAKE 1 TABLET(1 MG) BY MOUTH TWICE DAILY 30 tablet 1   DULoxetine (CYMBALTA) 30 MG capsule TAKE 1 CAPSULE(30 MG) BY MOUTH DAILY     fluticasone (FLONASE) 50 MCG/ACT nasal spray Place 1 spray into both nostrils daily. Begin by using 2 sprays in each nare daily for 3 to 5 days, then decrease to 1 spray in each nare daily. 15.8 mL 2   ipratropium (ATROVENT) 0.06 % nasal spray Place 2 sprays into both nostrils 3 (three) times daily. As needed for nasal congestion, runny nose 15 mL 1   metFORMIN (GLUCOPHAGE-XR) 500 MG 24 hr tablet Take 500 mg by mouth 2 (two) times daily.     metoprolol tartrate (LOPRESSOR) 50 MG tablet TAKE 1 TABLET(50 MG) BY MOUTH TWICE DAILY 180 tablet 3   Multiple Vitamin (MULTIVITAMIN) capsule Take 1 capsule by  mouth daily.     MYRBETRIQ 25 MG TB24 tablet Take 25 mg by mouth daily.  pregabalin (LYRICA) 50 MG capsule Take 50 mg by mouth 3 (three) times daily.     Semaglutide, 2 MG/DOSE, (OZEMPIC, 2 MG/DOSE,) 8 MG/3ML SOPN Inject 2 mg into the skin once a week.     spironolactone (ALDACTONE) 50 MG tablet Take 50 mg by mouth 2 (two) times daily.     topiramate (TOPAMAX) 50 MG tablet Take 1 tablet by mouth daily.     traZODone (DESYREL) 150 MG tablet Take 1 tablet (150 mg total) by mouth at bedtime. 90 tablet 3   Vitamin D, Ergocalciferol, (DRISDOL) 1.25 MG (50000 UT) CAPS capsule Take 50,000 Units by mouth every 7 (seven) days.     cetirizine (ZYRTEC) 10 MG tablet Take 1 tablet by mouth daily.     ibuprofen (ADVIL) 400 MG tablet Take 1 tablet (400 mg total) by mouth every 8 (eight) hours as needed for up to 30 doses. (Patient not taking: Reported on 05/23/2023) 30 tablet 0   metroNIDAZOLE (FLAGYL) 500 MG tablet Take 1 tablet (500 mg total) by mouth 2 (two) times daily. (Patient not taking: Reported on 11/17/2022) 14 tablet 0   naproxen (NAPROSYN) 500 MG tablet Take 1 tablet (500 mg total) by mouth 2 (two) times daily. (Patient not taking: Reported on 05/23/2023) 30 tablet 0   nirmatrelvir & ritonavir (PAXLOVID, 300/100,) 20 x 150 MG & 10 x 100MG  TBPK Take 2 tablets nirmtrelvir and 1 tablet ritonavir twice daily. (Patient not taking: Reported on 05/23/2023) 30 tablet 0   ondansetron (ZOFRAN-ODT) 8 MG disintegrating tablet Take 1 tablet (8 mg total) by mouth every 8 (eight) hours as needed for nausea or vomiting. (Patient not taking: Reported on 05/23/2023) 20 tablet 0   predniSONE (DELTASONE) 20 MG tablet Take 2 tablets daily with breakfast. (Patient not taking: Reported on 05/23/2023) 10 tablet 0   promethazine-dextromethorphan (PROMETHAZINE-DM) 6.25-15 MG/5ML syrup Take 5 mLs by mouth 3 (three) times daily as needed for cough. (Patient not taking: Reported on 05/23/2023) 200 mL 0   No facility-administered  medications prior to visit.    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   Rhinitis, chronic 08/21/2017   Sinusitis 02/13/2018   Snoring 08/21/2017    PAST SURGICAL HISTORY: Past Surgical History:  Procedure Laterality Date   PVC ABLATION N/A 06/18/2021   Procedure: PVC ABLATION;  Surgeon: Marinus Maw, MD;  Location: [facility];  Service: Cardiovascular;  Laterality: N/A;    FAMILY HISTORY: Family History  Problem Relation Age of Onset   Hypertension Mother    Hyperlipidemia Mother    Diabetes Mother    Hypertension Father    Hyperlipidemia Father    Diabetes Father    Heart attack Father 34   Stroke Father     SOCIAL HISTORY: Social History   Socioeconomic History   Marital status: Married    Spouse name: Not on file   Number of children: 3   Years of education: Not on file   Highest education level: Not on file  Occupational History   Not on file  Tobacco Use   Smoking status: Never   Smokeless tobacco: Never  Vaping Use   Vaping status: Never Used  Substance and Sexual Activity   Alcohol use: No    Alcohol/week: 0.0 standard drinks of alcohol   Drug use: No   Sexual activity:  Yes    Birth control/protection: None  Other Topics Concern   Not on file  Social History Narrative   Right Handed   No Caffeine Use    Social Determinants of Health   Financial Resource Strain: Low Risk  (11/18/2022)   Received from [facility]   Overall Financial Resource Strain (CARDIA)    Difficulty of Paying Living Expenses: Not hard at all  Food Insecurity: Low Risk  (03/07/2023)   [facility]   Food vital sign    Within the past 12 months, you worried that your food would run out before you got money to buy more: Never true    Within the past 12  months, the food you bought just didn't last and you didn't have money to get more. : Never true  Transportation Needs: No Transportation Needs (03/07/2023)   [facility]   Transportation    In the past 12 months, has lack of reliable transportation kept you from medical appointments, meetings, work or from getting things needed for daily living? : No  Physical Activity: Not on file  Stress: Not on file  Social Connections: Unknown (02/20/2022)   [facility]   Social Network    Social Network: Not on file  Intimate Partner Violence: Not At Risk (12/26/2022)   [facility] from [facility]   HITS    Over the last 12 months how often did your partner physically hurt you?: 1    Over the last 12 months how often did your partner insult you or talk down to you?: 1    Over the last 12 months how often did your partner threaten you with physical harm?: 1    Over the last 12 months how often did your partner scream or curse at you?: 1     PHYSICAL EXAM  Vitals:   05/23/23 0850  BP: 110/73  Pulse: 62  Weight: 186 lb (84.4 kg)  Height: 5\' 6"  (1.676 m)      Body mass index is 30.02 kg/m.  Generalized: Well developed, in no acute distress  Cardiology: normal rate and rhythm, PVC's, no murmur noted Respiratory: clear to auscultation bilaterally  Neurological examination  Mentation: Alert oriented to time, place, history taking. Follows all commands speech and language fluent Cranial nerve II-XII: Pupils were equal round reactive to light. Extraocular movements were full, visual field were full  Motor: The motor testing reveals 5 over 5 strength of all 4 extremities. Good symmetric motor tone is noted throughout.  Gait and station: Gait is normal.    DIAGNOSTIC DATA (LABS, IMAGING, TESTING) - I reviewed patient records, labs, notes, testing and imaging myself where available.      No data to display            Lab Results  Component Value Date   WBC 5.4 06/10/2021   HGB 13.0 06/10/2021   HCT 38.8 06/10/2021   MCV 89 06/10/2021   PLT 196 06/10/2021      Component Value Date/Time   NA 140 06/10/2021 1248   K 4.2 06/10/2021 1248   CL 102 06/10/2021 1248   CO2 29 06/10/2021 1248   GLUCOSE 101 (H) 06/10/2021 1248   BUN 7 06/10/2021 1248   CREATININE 0.77 06/10/2021 1248   CALCIUM 9.4 06/10/2021 1248   PROT 6.9 07/31/2017 1712   ALBUMIN 4.2 07/31/2017 1712   AST 26 07/31/2017 1712  ALT 11 07/31/2017 1712   ALKPHOS 63 07/31/2017 1712   BILITOT <0.2 07/31/2017 1712   GFRNONAA 90 07/31/2017 1712   GFRAA 104 07/31/2017 1712   No results found for: "CHOL", "HDL", "LDLCALC", "LDLDIRECT", "TRIG", "CHOLHDL" No results found for: "HGBA1C" No results found for: "VITAMINB12" Lab Results  Component Value Date   TSH 1.450 07/31/2017     ASSESSMENT AND PLAN 52 y.o. year old female  has a past medical history of Chronic maxillary sinusitis (08/21/2017), Frequent PVCs, Holter monitor, abnormal (08/22/2019), Hypertrophy of tonsil and adenoid (08/21/2017), Rhinitis, chronic (08/21/2017), Sinusitis (02/13/2018), and Snoring (08/21/2017). here with     ICD-10-CM   1. OSA on CPAP  G47.33     2. Insomnia, unspecified type  G47.00     3. S/P bariatric surgery  Z98.84     4. Weight loss  R63.4       ELLISSA MANFULL is doing fairly well with compliance. Current download shows optimal compliance with daily and 4 hour use. We will update supply orders as indicated. Risks of untreated sleep apnea review and education materials provided.  She continues to struggle with insomnia.  She does feel that trazodone helps her doze but she is not getting restful sleep. We have reviewed sleep hygiene and I have encouraged her to work on these at home. We previously referred her to psychiatry for insomnia and concerns of depression prior to being established with PCP. She could not find an affordable provider at  that time.  I have encouraged her to see psychology for CBT and discuss depression with PCP. She was scheduled with Dr Kieth Brightly but I will send to another psychologist as he does not routinely see patients for insomnia. Could consider psychiatry in the future if needed. She denies SI/HI. I have discussed concerns of taking clonazepam with trazodone, and pregabalin. This could contribute to excessive daytime sleepiness. Could consider additional sleep testing but would need to discontinue these medications. She was advised to discuss care plan with Dr Maple Hudson. I would advise she see either our office or Dr Maple Hudson for CPAP and insomnia follow up but not both. Healthy lifestyle habits encouraged. She will follow up pending next visit with Dr Maple Hudson. She verbalizes understanding and agreement with this plan.    No orders of the defined types were placed in this encounter.    No orders of the defined types were placed in this encounter.    I spent 30 minutes of face-to-face and non-face-to-face time with patient.  This included previsit chart review, lab review, study review, order entry, electronic health record documentation, patient education.   Shawnie Dapper, FNP-C 05/23/2023, 1:17 PM [facility] [address], [address] [place], [place] [address] [phone]

## 2023-05-23 ENCOUNTER — Ambulatory Visit: Payer: BC Managed Care – PPO | Admitting: Family Medicine

## 2023-05-23 ENCOUNTER — Encounter: Payer: Self-pay | Admitting: Family Medicine

## 2023-05-23 VITALS — BP 110/73 | HR 62 | Ht 66.0 in | Wt 186.0 lb

## 2023-05-23 DIAGNOSIS — R634 Abnormal weight loss: Secondary | ICD-10-CM

## 2023-05-23 DIAGNOSIS — Z9884 Bariatric surgery status: Secondary | ICD-10-CM | POA: Diagnosis not present

## 2023-05-23 DIAGNOSIS — G4700 Insomnia, unspecified: Secondary | ICD-10-CM | POA: Diagnosis not present

## 2023-05-23 DIAGNOSIS — G4733 Obstructive sleep apnea (adult) (pediatric): Secondary | ICD-10-CM | POA: Diagnosis not present

## 2023-05-23 NOTE — Addendum Note (Signed)
Addended byNicholas Lose, Axie Hayne L on: 05/23/2023 01:09 PM   Modules accepted: Orders

## 2023-05-23 NOTE — Telephone Encounter (Signed)
Refax referral for psychology to [facility]. Phone: [phone], Fax: [phone]

## 2023-05-23 NOTE — Patient Instructions (Signed)
Please continue using your CPAP regularly. While your insurance requires that you use CPAP at least 4 hours each night on 70% of the nights, I recommend, that you not skip any nights and use it throughout the night if you can. Getting used to CPAP and staying with the treatment long term does take time and patience and discipline. Untreated obstructive sleep apnea when it is moderate to severe can have an adverse impact on cardiovascular health and raise her risk for heart disease, arrhythmias, hypertension, congestive heart failure, stroke and diabetes. Untreated obstructive sleep apnea causes sleep disruption, nonrestorative sleep, and sleep deprivation. This can have an impact on your day to day functioning and cause daytime sleepiness and impairment of cognitive function, memory loss, mood disturbance, and problems focussing. Using CPAP regularly can improve these symptoms.  Please talk with Dr Maple Hudson about management of sleep. We will continue trazodone for now but will need to establish which provider will be managing your CPAP and insomnia. I will work on the psychology referral. I will reach out once I hear back. Please continue healthy life style changes. We could consider revisit with Dr Frances Furbish for additional sleep testing but would require you discontinue several medications for at least 30 days.   Follow up pending visit with Dr Maple Hudson.

## 2023-05-23 NOTE — Telephone Encounter (Signed)
Dr. Marvetta Gibbons office do not treat insomnia. His office receptionist said pt probably need to see PCP. I would need a new referral to send to another psychologist office. Thank you

## 2023-07-17 DIAGNOSIS — R42 Dizziness and giddiness: Secondary | ICD-10-CM | POA: Insufficient documentation

## 2023-07-17 DIAGNOSIS — R2689 Other abnormalities of gait and mobility: Secondary | ICD-10-CM | POA: Insufficient documentation

## 2023-07-31 DIAGNOSIS — H8190 Unspecified disorder of vestibular function, unspecified ear: Secondary | ICD-10-CM | POA: Insufficient documentation

## 2023-08-16 ENCOUNTER — Encounter: Payer: BC Managed Care – PPO | Attending: Psychology | Admitting: Psychology

## 2023-08-16 ENCOUNTER — Encounter: Payer: Self-pay | Admitting: Psychology

## 2023-08-16 DIAGNOSIS — G4733 Obstructive sleep apnea (adult) (pediatric): Secondary | ICD-10-CM | POA: Insufficient documentation

## 2023-08-16 DIAGNOSIS — F4323 Adjustment disorder with mixed anxiety and depressed mood: Secondary | ICD-10-CM | POA: Diagnosis present

## 2023-08-16 DIAGNOSIS — F5101 Primary insomnia: Secondary | ICD-10-CM | POA: Insufficient documentation

## 2023-08-16 NOTE — Progress Notes (Unsigned)
Neuropsychological Consultation   Patient:   Christy Holland   DOB:   1971/04/20  MR Number:  [id number]  Location:  [facility] [place] [facility]ICAL MEDICINE & REHABILITATION [address] [place], [address] [place] [address] Dept: [phone]           Date of Service:   ***  Location of Service and Individuals present:  ***  Start Time:   *** End Time:   ***  Patient Consent and Confidentiality: ***  Consent for Evaluation and Treatment:  Signed:  {yes/no:20286} Explanation of Privacy Policies:  Signed:  {yes/no:20286} Discussion of Confidentiality Limits:  {yes/no:20286}  Provider/Observer:  Arley Phenix, Psy.D.       Clinical Neuropsychologist       Billing Code/Service: [phone]  Chief Complaint:    No chief complaint on file.   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/01/2019   Sinusitis 02/13/2018   Chronic maxillary sinusitis 08/21/2017   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/21/2017   Rhinitis, chronic 08/21/2017   OSA (obstructive sleep apnea) 08/21/2017    Onset and Duration of Symptoms:  ***  Progression of Symptoms:  ***  Triggering Factors:  ***  Associated Symptoms (e.g., cognitive, emotional, behavioral):  ***  Additional Tests and Measures from other records:  Neuroimaging Results:  ***   Laboratory Tests:  ***   Other Relevant Assessments:  ***  Current Typical Mood State:  {CCM LVAD Mood VOHY:073710626}  Sleep:  ***  Diet  Pattern:  ***  Behavioral Observation/Mental Status:   Christy Holland  presents as a 52 y.o.-year-old {Handed:22697} handed {Race/ethnicity:17218} {INFANT GENDER IN OR:22171} who appeared her stated age. her dress was {Desc;appropriate/inappropriate:5787} and she was {Appearance:22683} and her manners were {Desc;appropriate/inappropriate:5787} to the situation.  her participation was indicative of {BHH PARTICIPATION QUALITY:22265} behaviors.  There {were/were RSW:54627} physical disabilities noted.  she displayed an {Desc; ppropriate/inappropriate:30686} level of cooperation and motivation.    Interactions:    {BHH PARTICIPATION LEVEL:22264} {BHH PARTICIPATION QUALITY:22265}  Attention:   {Desc; normal/abnormal/low/high:18745} and {EXAM; NEURO PED ATTENTION:18736}  Memory:   {Desc; normal/abnormal/low/high:18745}; {EXAM; NEURO PED OJJKKX:38182}  Visuo-spatial:   {Desc; normal/abnormal/low/high:18745}  Speech (Volume):  {desc; low/normal/high/v XHBZ:16967}  Speech:   {Findings; speech psychiatric:30485}; {Findings; speech psychiatric:30485}  Thought Process:  {BHH THOUGHT PROCESS:22309}  {RT BHH Speech/Thought:26276}  Though Content:  {BHH THOUGHT CONTENT:22310}; {EXAM; PSYCH THOUGHT ELFYBOF:75102}  Orientation:   {orientation:30299}  Judgment:   {BHH JUDGMENT:22312}  Planning:   {BHH JUDGMENT:22312}  Affect:    {BHH AFFECT:22266}  Mood:    {BHH MOOD:22306}  Insight:   {Insight (PAA):22695}  Intelligence:   {desc; low/normal/high/v HENI:77824}  Marital Status/Living:  Games developer and Occupational History:     Highest Level of Education:  Publishing copy Achievements: ***  Current Occupation:    ***  Work History:   ***  Hobbies and Interests: ***  Impact of Symptoms on Work or School:  ***  Impact of Symptoms on Social Functioning and Interpersonal Relationships:  ***   Psychiatric History:    Abuse/Trauma  History: ***  Previous Diagnoses: ***  Past Psychiatric Treatments: {Hx; Psychological-Psychiatric History:210120512}  History of Substance Use or Abuse:  {Substance abuse:20568}  ***  Mental Health Hospitalizations:  {YES/NO:21197}  Family Med/Psych History:  Family History  Problem Relation Age of Onset   Hypertension Mother    Hyperlipidemia Mother    Diabetes Mother    Hypertension Father    Hyperlipidemia Father    Diabetes Father    Heart attack Father 46   Stroke Father     Risk of Suicide/Violence: {desc; high/low:14016} ***    Impression/DX:   ***  Disposition/Plan:  ***  Diagnosis:    No diagnosis found.        Note: This document was prepared using Dragon voice recognition software and may include unintentional dictation errors.   Electronically Signed   _______________________ Arley Phenix, Psy.D. Clinical Neuropsychologist

## 2023-11-14 DIAGNOSIS — M545 Low back pain, unspecified: Secondary | ICD-10-CM | POA: Insufficient documentation

## 2023-11-14 DIAGNOSIS — M542 Cervicalgia: Secondary | ICD-10-CM | POA: Insufficient documentation

## 2023-11-22 DIAGNOSIS — M25561 Pain in right knee: Secondary | ICD-10-CM | POA: Insufficient documentation

## 2023-12-27 ENCOUNTER — Other Ambulatory Visit (HOSPITAL_BASED_OUTPATIENT_CLINIC_OR_DEPARTMENT_OTHER): Payer: Self-pay | Admitting: *Deleted

## 2023-12-27 DIAGNOSIS — G5603 Carpal tunnel syndrome, bilateral upper limbs: Secondary | ICD-10-CM | POA: Insufficient documentation

## 2023-12-27 MED ORDER — METOPROLOL TARTRATE 50 MG PO TABS: 50.0000 mg | ORAL_TABLET | Freq: Two times a day (BID) | ORAL | 0 refills | Status: DC

## 2024-01-26 DIAGNOSIS — M1711 Unilateral primary osteoarthritis, right knee: Secondary | ICD-10-CM | POA: Insufficient documentation

## 2024-02-12 ENCOUNTER — Encounter: Payer: BC Managed Care – PPO | Attending: Psychology | Admitting: Psychology

## 2024-02-12 DIAGNOSIS — F4323 Adjustment disorder with mixed anxiety and depressed mood: Secondary | ICD-10-CM | POA: Insufficient documentation

## 2024-02-12 DIAGNOSIS — G4733 Obstructive sleep apnea (adult) (pediatric): Secondary | ICD-10-CM | POA: Insufficient documentation

## 2024-02-12 DIAGNOSIS — F5101 Primary insomnia: Secondary | ICD-10-CM | POA: Diagnosis not present

## 2024-02-12 NOTE — Progress Notes (Signed)
 Neuropsychology Visit  Patient:  Christy Holland   DOB: [birth year]  MR Number: [id number]  Location: [facility] [place] PHYSICAL MEDICINE AND REHABILITATION [address] [place], [address] [place] [address] Dept: [phone]  Date of Service: 02/12/2024  Start: 2 PM End: 3 PM  Today's visit was an in person visit conducted in my outpatient clinic office with the patient myself present.  Duration of Service: 1 Hour  Provider/Observer:     Marrion Sjogren PsyD  Chief Complaint:      Chief Complaint  Patient presents with   Stress   Sleeping Problem    Reason For Service:     Christy Holland is a 53 year old female referred for neuropsychological consultation by Christy Fick, NP with [facility].  Patient was referred for therapeutic interventions around significant sleep disturbance and intrusive thinking affecting onset of sleep along with anxiety and negative thinking exacerbating onset of asleep.  Patient has been prescribed trazodone  to help with sleep and has been prescribed clonazepam  by her pulmonologist Christy Fortune, MD.  Patient has also been prescribed Cymbalta by her PCP.  Patient has been diagnosed with obstructive sleep apnea with CPAP use 3 to 4 years ago initially by Christy Fortune, MD and has also been followed by Christy Fick, NP with [facility].   The patient reports that she is quite compliant with her CPAP use and uses it every night.  However, insomnia has always been described as a significant issue.  Patient reports that this started around the time she had children and needing to be a light sleeper to "keep an eye out for the kids."  Patient reports that to some degree her insomnia has always existed.  Patient notes that the slightest noise will wake her up even if she uses sleep medications.  Patient reports that she will become drowsy but then continue to wake up very easily.   Patient has never worked a third shift type job.  Patient reports that when she does wake up it is then hard to go back to sleep and that her "mind goes around and around thinking about everything."  Patient reports that she wakes up and knows what is going to happen with difficulty going back to sleep which causes a lot of stress upon wakening.  Patient reports that when she wakes up in the middle the night that she knows that it is too late to take something to help her go back to sleep.  Patient reports that when she wakes up she will "sit there thinking with racing/intrusive thoughts."  Patient reports that if this process goes on too long that she will eventually get up after an hour or so and find something to do hoping to get tired again.   Patient reports that this poor sleep pattern does appear to be causing some cognitive difficulties and she feels like she is in a constant state of "brain fog."  Patient reports that she is chronically fatigued and feels tired all the time and has an inability to concentrate and focus.  Patient notes that this has not been a lifelong issue with attention and cognitive difficulties and that it is directly related to the quality of sleep that she has.   Patient's sibling has a history of posttraumatic stress disorder, depression, intrusive racing thoughts and attention deficit disorder diagnoses.   Patient describes current stressors having to do with her ongoing difficulties with sleep,  elderly mother with various health issues requiring caretaking responsibilities and personal health challenges including managing her pain.  Treatment Interventions:  Today we worked on coping and adjustment issues looking at cognitive behavioral therapeutic interventions as well as primary focus on sleep hygiene and strategies to cope with intrusive thinking that is interfering with onset of sleep.  Participation Level:   Active  Participation Quality:  Appropriate       Behavioral Observation:  Well Groomed, Alert, and Appropriate.   Current Psychosocial Factors: The patient reports that she has continued to struggle with her sleep issues which are increasing anxiety and depressive type features and impacting her attentional components.  The patient is struggling to get adequate sleep and even with her CPAP use she still has difficulty falling asleep etc.  Content of Session:   Reviewed current symptoms particularly around patterns of sleep with delayed onset of sleep and laying in bed for hours with mind racing/wondering.  Effectiveness of Interventions: The patient was an active participant and we worked on primary sleep hygiene issues and cognitive/behavioral therapeutic interventions.  Target Goals:   Work on improving chronic insomnia and sleep disturbance with significant impact on adjustment and coping issues.  Goals Last Reviewed:   02/12/2024  Goals Addressed Today:    Today we worked on fundamental aspects of sleep hygiene and strategies to try to normalize her sleep-wake cycles.  Impression/Diagnosis:   PARA COSSEY is a 53 year old female referred for neuropsychological consultation by Christy Fick, NP with [facility].  Patient was referred for therapeutic interventions around significant sleep disturbance and intrusive thinking affecting onset of sleep along with anxiety and negative thinking exacerbating onset of asleep.  Patient has been prescribed trazodone  to help with sleep and has been prescribed clonazepam  by her pulmonologist Christy Fortune, MD.  Patient has also been prescribed Cymbalta by her PCP.  Patient has been diagnosed with obstructive sleep apnea with CPAP use 3 to 4 years ago initially by Christy Fortune, MD and has also been followed by Christy Fick, NP with [facility].  Diagnosis:   Adjustment disorder with mixed anxiety and depressed mood  OSA (obstructive sleep apnea)  Primary  insomnia    Christy Holland, Psy.D. Clinical Psychologist Neuropsychologist           Today we worked on sleeping issues including sleep hygiene and other foundational issues.  This was the first of our 3 scheduled therapeutic visits.  Complete report to an be found when this is finished.

## 2024-02-13 ENCOUNTER — Encounter: Payer: Self-pay | Admitting: Psychology

## 2024-02-29 ENCOUNTER — Encounter: Payer: BC Managed Care – PPO | Admitting: Psychology

## 2024-03-29 ENCOUNTER — Telehealth: Payer: Self-pay

## 2024-03-29 NOTE — Telephone Encounter (Signed)
 Called to inform patient to bring cpap or sd card to machine on Monday to appointment with Dr.Young.Patient verbalized understanding

## 2024-03-31 NOTE — Progress Notes (Signed)
 HPI F never smoker followed for Insomnia, Minimal OSA( 6.6/ hr)/ Snoring,  complicated by Dyspnea on exertion, PVCs/ Ablation, Chronic Rhinitis, Chronic Maxillary /sinusitis, Tonsil Hypertrophy, Obesity,  HST ([facility]) 05/08/2019- AHI 6.6/ hr, body weight 218 lbs  ===============================================================   11/12/21- 50 yoF never smoker followed for Insomnia, Minimal OSA( 6.6/ hr)/ Snoring,  complicated by Dyspnea on exertion, PVCs/ Ablation, Chronic Rhinitis, Chronic Maxillary /sinusitis, Tonsil Hypertrophy, Obesity,  -Clonazepam  0.5 mg 1-2 CPAP  auto 5-11/ [facility]  [employer] [place] [employer] - compliance N/A Had cardiology ablation for PVCs Body weight today-247 lbs Covid vax-3 Phizer Flu vax-had -----Patient states that she is still not getting a restful sleep at night. She is having sinus issues right now. Head congestion and pressure, clearing throat,teeth hurting,  productive cough with yellow/green sputum. She states that she is using the CPAP but can't feel the air or anything from the congestion.  Discussed management of sinus infection and will send Augmentin . Still dealing with insomnia and says up to 2 mg of clonazepam  still not enough.  Then asks about a stimulant such as Adderall for excessive daytime sleepiness.  Pointed out emphasis on getting enough nighttime sleep first and occasional nap if needed.  Once sinus infection is resolved and she can get back to regular use of CPAP, we can reassess and consider alternative to clonazepam .  For now I am refilling that for 1 month, using 1 mg tabs.  Need to make sure long half-life of clonazepam  is not contributing to daytime sleepiness.  I suggested she try an occasional caffeine tablet since she is not a coffee drinker.  She will ask her cardiologist about use of any stimulants with her history of ventricular irritability.  04/01/24- 52 yoF never smoker followed for Insomnia, Minimal OSA( 6.6/ hr)/ Snoring,  complicated by  Dyspnea on exertion, PVCs/ Ablation, Chronic Rhinitis, Chronic Maxillary /sinusitis, Tonsil Hypertrophy, Obesity,  -Clonazepam  0.5 mg 1-2 CPAP  auto 5-11/ [facility]  [employer] - compliance  SD card is blank Full face mask won't seal. She does admit she feels better off with CPAP Clonazepam  does help sleep quality- discussed.   ROS-see HPI   + = positive Constitutional:    weight loss, night sweats, fevers, chills, f+atigue, lassitude. HEENT:    +headaches, difficulty swallowing, tooth/dental problems, sore throat,       sneezing, itching, ear ache, +nasal congestion, post nasal drip, snoring CV:    chest pain, orthopnea, PND, swelling in lower extremities, anasarca,                                   dizziness, palpitations Resp:   shortness of breath with exertion or at rest.                productive cough,   non-productive cough, coughing up of blood.              change in color of mucus.  wheezing.   Skin:    rash or lesions. GI:  No-   heartburn, indigestion, abdominal pain, nausea, vomiting, diarrhea,                 change in bowel habits, loss of appetite GU: dysuria, change in color of urine, no urgency or frequency.   flank pain. MS:   joint pain, stiffness, decreased range of motion, back pain. Neuro-     nothing unusual  Psych:  change in mood or affect.  depression or anxiety.   memory loss.  OBJ- Physical Exam General- Alert, Oriented, Affect-appropriate, Distress- none acute, + obese Skin- rash-none, lesions- none, excoriation- none Lymphadenopathy- none Head- atraumatic            Eyes- Gross vision intact, PERRLA, conjunctivae and secretions clear            Ears- Hearing, canals-normal            Nose- +turbinate edema, no-Septal dev, mucus, polyps, erosion, perforation             Throat- Mallampati II , mucosa clear , drainage- none, tonsils- atrophic, + teeth Neck- flexible , trachea midline, no stridor , thyroid  nl, carotid no bruit Chest -  symmetrical excursion , unlabored           Heart/CV- RRR , no murmur , no gallop  , no rub, nl s1 s2                           - JVD- none , edema- none, stasis changes- none, varices- none           Lung- clear to P&A, wheeze- none, cough- none , dullness-none, rub- none           Chest wall-  Abd-  Br/ Gen/ Rectal- Not done, not indicated Extrem- cyanosis- none, clubbing, none, atrophy- none, strength- nl Neuro- grossly intact to observation

## 2024-04-01 ENCOUNTER — Ambulatory Visit (INDEPENDENT_AMBULATORY_CARE_PROVIDER_SITE_OTHER): Admitting: Internal Medicine

## 2024-04-01 ENCOUNTER — Encounter: Payer: Self-pay | Admitting: Internal Medicine

## 2024-04-01 VITALS — BP 98/58 | HR 75 | Temp 98.5°F | Ht 66.0 in | Wt 161.2 lb

## 2024-04-01 DIAGNOSIS — G4700 Insomnia, unspecified: Secondary | ICD-10-CM | POA: Diagnosis not present

## 2024-04-01 DIAGNOSIS — G4733 Obstructive sleep apnea (adult) (pediatric): Secondary | ICD-10-CM | POA: Diagnosis not present

## 2024-04-01 DIAGNOSIS — R0609 Other forms of dyspnea: Secondary | ICD-10-CM | POA: Diagnosis not present

## 2024-04-01 DIAGNOSIS — F5101 Primary insomnia: Secondary | ICD-10-CM

## 2024-04-01 NOTE — Patient Instructions (Addendum)
 Order- DME [facility]- please continue CPAP auto 5-11. Please refit mask of choice,  face to face for seal and comfort, please provide new download card  Order- schedule PFT    dx dyspnea on exertion

## 2024-04-11 ENCOUNTER — Other Ambulatory Visit (HOSPITAL_BASED_OUTPATIENT_CLINIC_OR_DEPARTMENT_OTHER): Payer: Self-pay | Admitting: Internal Medicine

## 2024-04-12 ENCOUNTER — Other Ambulatory Visit (HOSPITAL_BASED_OUTPATIENT_CLINIC_OR_DEPARTMENT_OTHER): Payer: Self-pay | Admitting: Internal Medicine

## 2024-05-01 ENCOUNTER — Ambulatory Visit (INDEPENDENT_AMBULATORY_CARE_PROVIDER_SITE_OTHER): Payer: BC Managed Care – PPO | Admitting: Dermatology

## 2024-05-01 ENCOUNTER — Encounter: Payer: Self-pay | Admitting: Dermatology

## 2024-05-01 VITALS — BP 103/65 | HR 62

## 2024-05-01 DIAGNOSIS — L821 Other seborrheic keratosis: Secondary | ICD-10-CM | POA: Diagnosis not present

## 2024-05-01 DIAGNOSIS — N920 Excessive and frequent menstruation with regular cycle: Secondary | ICD-10-CM | POA: Insufficient documentation

## 2024-05-01 DIAGNOSIS — L700 Acne vulgaris: Secondary | ICD-10-CM

## 2024-05-01 DIAGNOSIS — D225 Melanocytic nevi of trunk: Secondary | ICD-10-CM | POA: Diagnosis not present

## 2024-05-01 DIAGNOSIS — L810 Postinflammatory hyperpigmentation: Secondary | ICD-10-CM | POA: Diagnosis not present

## 2024-05-01 DIAGNOSIS — D229 Melanocytic nevi, unspecified: Secondary | ICD-10-CM

## 2024-05-01 MED ORDER — TRETINOIN 0.05 % EX CREA: TOPICAL_CREAM | Freq: Every day | CUTANEOUS | 2 refills | Status: AC

## 2024-05-01 NOTE — Progress Notes (Signed)
   New Patient Visit   Subjective  Christy Holland is a 53 y.o. female who presents for the following: Acne and discoloration  Patient states she has acne located at the face that she would like to have examined. Patient reports the areas have been there for several years. She reports the areas are bothersome. Patient rates irritation 7 out of 10. Patient reports she has not previously been treated for these areas. Patient reports she is currently washing 2 times daily with Cetaphil gentle facial wash and she moisturizes with Cetaphil.   The following portions of the chart were reviewed this encounter and updated as appropriate: medications, allergies, medical history  Review of Systems:  No other skin or systemic complaints except as noted in HPI or Assessment and Plan.  Objective  Well appearing patient in no apparent distress; mood and affect are within normal limits.  A focused examination was performed of the following areas: Face  Relevant exam findings are noted in the Assessment and Plan.           Assessment & Plan   ACNE VULGARIS with secondary PIH  Assessment: Patient presents with concerns about facial skin texture changes, acne, and hyperpigmentation. Physical examination reveals texture changes, some congested pores, and a few closed comedones. No cysts or inflammatory acne lesions were observed. The patient's current skincare regimen consists of washing with Cetaphil and using their moisturizer.   Exam: Open comedones and inflammatory papules  Not at goal  Treatment Plan: - Prescribed Tretinoin  0.05%, for the first month apply 2 nights weekly, after 1st increase to 3 nights weekly - Recommended applying Eucerin Radiant Tone 2 times daily  - Recommended continuing Cetaphil facial wash - Plan to follow up in 4 months to re-assess  MELANOCYTIC NEVI Exam: Tan-brown and/or pink-flesh-colored symmetric macules and papules located on Left breast  Treatment Plan:  Benign appearing on exam today. Recommend observation. Call clinic for new or changing moles. Recommend daily use of broad spectrum spf 30+ sunscreen to sun-exposed areas.   SEBORRHEIC KERATOSIS Exam: Stuck-on, waxy, tan-brown papules and/or plaques on right Breast  Treatment Plan: - Benign-appearing - Discussed benign etiology and prognosis. - Observe - Call for any changes  ACNE VULGARIS   Related Medications tretinoin  (RETIN-A ) 0.05 % cream Apply topically at bedtime. For the 1st month apply 2 night weekly (m and Thur), after 1st month increase usage to 3 nights weekly (M-W-F)  Return in about 4 months (around 09/01/2024) for Acne & PIH F/U.  I, Jetta Ager, am acting as Neurosurgeon for [employer], DO.  Documentation: I have reviewed the above documentation for accuracy and completeness, and I agree with the above.  Delon Lenis, DO

## 2024-05-01 NOTE — Patient Instructions (Addendum)
 Date: Wed May 01 2024  Hello Christy Holland,  Thank you for visiting today. Here is a summary of the key instructions:  Morning Skincare Routine: - Wash face with Cetaphil - Apply Eucerin Radiant Tone - Apply moisturizer with sunscreen (Centella recommended)  Evening Skincare Routine: - Wash face with Cetaphil - Apply Eucerin Radiant Tone - Apply treatments as follows:  On Tretinoin  nights (2 nights per week, increasing to 3): - Apply a pea-sized amount of Tretinoin  0.05% cream - Follow with Cicalfate Balm by Avene  On non-Tretinoin  nights: - Apply Cyclophate Balm by [employer]vene  Medications: - Tretinoin  0.05% cream   - Start with 2 nights per week   - After 1 month, if no irritation, increase to 3 nights per week   - Goal: Use on Monday, Wednesday, Friday  Follow-up: - Return for follow-up appointment in 4 months  We look forward to seeing the positive changes in your next visit. If you have any questions or concerns before then, please do not hesitate to contact our office.  Warm regards,  Dr. Delon Lenis, Dermatology              Important Information   Due to recent changes in healthcare laws, you may see results of your pathology and/or laboratory studies on MyChart before the doctors have had a chance to review them. We understand that in some cases there may be results that are confusing or concerning to you. Please understand that not all results are received at the same time and often the doctors may need to interpret multiple results in order to provide you with the best plan of care or course of treatment. Therefore, we ask that you please give us  2 business days to thoroughly review all your results before contacting the office for clarification. Should we see a critical lab result, you will be contacted sooner.     If You Need Anything After Your Visit   If you have any questions or concerns for your doctor, please call our main line at [phone]. If  no one answers, please leave a voicemail as directed and we will return your call as soon as possible. Messages left after 4 pm will be answered the following business day.    You may also send us  a message via MyChart. We typically respond to MyChart messages within 1-2 business days.  For prescription refills, please ask your pharmacy to contact our office. Our fax number is [phone].  If you have an urgent issue when the clinic is closed that cannot wait until the next business day, you can page your doctor at the number below.     Please note that while we do our best to be available for urgent issues outside of office hours, we are not available 24/7.    If you have an urgent issue and are unable to reach us , you may choose to seek medical care at your doctor's office, retail clinic, urgent care center, or emergency room.   If you have a medical emergency, please immediately call 911 or go to the emergency department. In the event of inclement weather, please call our main line at [phone] for an update on the status of any delays or closures.  Dermatology Medication Tips: Please keep the boxes that topical medications come in in order to help keep track of the instructions about where and how to use these. Pharmacies typically print the medication instructions only on the boxes and not directly on the  medication tubes.   If your medication is too expensive, please contact our office at [phone] or send us  a message through MyChart.    We are unable to tell what your co-pay for medications will be in advance as this is different depending on your insurance coverage. However, we may be able to find a substitute medication at lower cost or fill out paperwork to get insurance to cover a needed medication.    If a prior authorization is required to get your medication covered by your insurance company, please allow us  1-2 business days to complete this process.   Drug prices  often vary depending on where the prescription is filled and some pharmacies may offer cheaper prices.   The website www.goodrx.com contains coupons for medications through different pharmacies. The prices here do not account for what the cost may be with help from insurance (it may be cheaper with your insurance), but the website can give you the price if you did not use any insurance.  - You can print the associated coupon and take it with your prescription to the pharmacy.  - You may also stop by our office during regular business hours and pick up a GoodRx coupon card.  - If you need your prescription sent electronically to a different pharmacy, notify our office through [facility] or by phone at [phone]

## 2024-05-03 ENCOUNTER — Encounter: Payer: Self-pay | Admitting: Internal Medicine

## 2024-05-03 NOTE — Assessment & Plan Note (Signed)
 Sleep hygiene reviewed Plan- refill clonazepam  with discussion

## 2024-05-03 NOTE — Assessment & Plan Note (Signed)
 Benefits from CPAP Plan- DME company to arrange face to face visit to refit mas of choice and replacee SD card for download.

## 2024-05-08 ENCOUNTER — Ambulatory Visit: Admitting: Internal Medicine

## 2024-05-08 DIAGNOSIS — R0609 Other forms of dyspnea: Secondary | ICD-10-CM

## 2024-05-08 LAB — PULMONARY FUNCTION TEST
DL/VA % pred: 125 %
DL/VA: 5.29 ml/min/mmHg/L
DLCO cor % pred: 96 %
DLCO cor: 21.49 ml/min/mmHg
DLCO unc % pred: 95 %
DLCO unc: 21.22 ml/min/mmHg
FEF 25-75 Post: 2.92 L/s
FEF 25-75 Pre: 3.15 L/s
FEF2575-%Change-Post: -7 %
FEF2575-%Pred-Post: 104 %
FEF2575-%Pred-Pre: 112 %
FEV1-%Change-Post: -3 %
FEV1-%Pred-Post: 90 %
FEV1-%Pred-Pre: 93 %
FEV1-Post: 2.67 L
FEV1-Pre: 2.77 L
FEV1FVC-%Change-Post: -8 %
FEV1FVC-%Pred-Pre: 113 %
FEV6-%Change-Post: 5 %
FEV6-%Pred-Post: 88 %
FEV6-%Pred-Pre: 83 %
FEV6-Post: 3.21 L
FEV6-Pre: 3.05 L
FEV6FVC-%Pred-Post: 102 %
FEV6FVC-%Pred-Pre: 102 %
FVC-%Change-Post: 5 %
FVC-%Pred-Post: 85 %
FVC-%Pred-Pre: 81 %
FVC-Post: 3.21 L
FVC-Pre: 3.05 L
Post FEV1/FVC ratio: 83 %
Post FEV6/FVC ratio: 100 %
Pre FEV1/FVC ratio: 91 %
Pre FEV6/FVC Ratio: 100 %
RV % pred: 138 %
RV: 2.67 L
TLC % pred: 98 %
TLC: 5.28 L

## 2024-05-08 NOTE — Progress Notes (Signed)
 Full PFT performed today.

## 2024-05-08 NOTE — Patient Instructions (Signed)
 Full PFT performed today.

## 2024-05-22 ENCOUNTER — Encounter: Payer: BC Managed Care – PPO | Admitting: Psychology

## 2024-05-27 ENCOUNTER — Telehealth: Payer: Self-pay | Admitting: Family Medicine

## 2024-05-27 NOTE — Telephone Encounter (Signed)
 FYI: Patient called to schedule follow appointment  after seeing Dr. Corina. Patient did wish to elaborate.

## 2024-05-27 NOTE — Progress Notes (Unsigned)
 HPI F never smoker followed for Insomnia, Minimal OSA( 6.6/ hr)/ Snoring,  complicated by Dyspnea on exertion, PVCs/ Ablation, Chronic Rhinitis, Chronic Maxillary /sinusitis, Tonsil Hypertrophy, Obesity,  HST ([facility]) 05/08/2019- AHI 6.6/ hr, body weight 218 lbs PFT 05/08/24- WNL except DLCO relatively increased for lung volume     Done for c/o dyspnea on exertion ===============================================================   04/01/24- 52 yoF never smoker followed for Insomnia, Minimal OSA( 6.6/ hr)/ Snoring,  complicated by Dyspnea on exertion, PVCs/ Ablation, Chronic Rhinitis, Chronic Maxillary /sinusitis, Tonsil Hypertrophy, Obesity,  -Clonazepam  0.5 mg 1-2 CPAP  auto 5-11/ [employer]  [place] [employer] - compliance  SD card is blank Full face mask won't seal. She does admit she feels better off with CPAP Clonazepam  does help sleep quality- discussed.  05/28/24- 52 yoF never smoker followed for Insomnia, Minimal OSA( 6.6/ hr)/ Snoring,  complicated by Dyspnea on exertion, PVCs/ Ablation, Chronic Rhinitis, Chronic Maxillary /sinusitis, Tonsil Hypertrophy, Obesity,  -Clonazepam  0.5 mg 1-2 CPAP  auto 5-11/ [employer]  [employer] - compliance  - Body weight today-147 lbs PFT 05/08/24- WNL except DLCO relatively increased for lung volume     Done for c/o dyspnea on exertion [employer] refit mask and put in new SD card after last visit, but nobody told her to bring the card today. Feels drowsy, but still not sleeping deeply on clonazepam  2x 0.5 mg. She will try 3 tabs.  Dyspnea on exertion with some dry cough, no wheeze. Discussed the use of AI scribe software for clinical note transcription with the patient, who gave verbal consent to proceed.  History of Present Illness   Christy Holland is a 53 year old female who presents with sleep disturbances and shortness of breath on exertion.  She uses a CPAP machine for sleep apnea, which is beneficial, with no issues regarding the  machine or mask. Clonazepam , 1 mg at night, causes drowsiness but does not maintain sleep. She experiences shortness of breath upon exertion and denies smoking. She is unaware of any heart problems but had excessive PVCs that were corrected. Occasional dry cough is present. She was slightly anemic with a hemoglobin level of 11.7 in June. No wheezing is noted.     Cardiac hx ablation for PVCs, but no angina or CHF. Mild anemia. Weighed 231 lbs in 2022. Now 147.  Assessment and Plan;    Obstructive sleep apnea Obstructive sleep apnea well-managed with CPAP. Improved quality of life reported. SD card unavailable for review. - Contact [employer] for CPAP machine download. - Request she bring SD card for nurse download.  Insomnia associated with obstructive sleep apnea Insomnia persists despite clonazepam  1 mg at night causing drowsiness but not maintaining sleep. - Advise taking three x 0.5 mg clonazepam  tablets at night with caution for daytime effects.  Chronic cough, Dyspnea on exertion Chronic cough, occasionally productive, with normal pulmonary function tests and no significant anemia or smoking history. Previous chest x-ray normal. - Order chest x-ray to rule out contributing issues. - Provide Trelegy inhaler sample for trial use, instruct once daily use and mouth rinse.        ROS-see HPI   + = positive Constitutional:    +weight loss, night sweats, fevers, chills, f+atigue, lassitude. HEENT:    +headaches, difficulty swallowing, tooth/dental problems, sore throat,       sneezing, itching, ear ache, +nasal congestion, post nasal drip, snoring CV:    chest pain, orthopnea, PND, swelling in lower extremities, anasarca,  dizziness, palpitations Resp:   shortness of breath with exertion or at rest.                productive cough,   non-productive cough, coughing up of blood.              change in color of mucus.  wheezing.   Skin:    rash or  lesions. GI:  No-   heartburn, indigestion, abdominal pain, nausea, vomiting, diarrhea,                 change in bowel habits, loss of appetite GU: dysuria, change in color of urine, no urgency or frequency.   flank pain. MS:   joint pain, stiffness, decreased range of motion, back pain. Neuro-     nothing unusual Psych:  change in mood or affect.  depression or anxiety.   memory loss.  OBJ- Physical Exam General- Alert, Oriented, Affect-appropriate, Distress- none acute, Slender- note significant weight loss since 2022( Ozempic) Skin- rash-none, lesions- none, excoriation- none Lymphadenopathy- none Head- atraumatic            Eyes- Gross vision intact, PERRLA, conjunctivae and secretions clear            Ears- Hearing, canals-normal            Nose- +turbinate edema, no-Septal dev, mucus, polyps, erosion, perforation             Throat- Mallampati II , mucosa clear , drainage- none, tonsils- atrophic, + teeth Neck- flexible , trachea midline, no stridor , thyroid  nl, carotid no bruit Chest - symmetrical excursion , unlabored           Heart/CV- RRR , no murmur , no gallop  , no rub, nl s1 s2                           - JVD- none , edema- none, stasis changes- none, varices- none           Lung- clear to P&A, wheeze- none, cough- none , dullness-none, rub- none           Chest wall-  Abd-  Br/ Gen/ Rectal- Not done, not indicated Extrem- cyanosis- none, clubbing, none, atrophy- none, strength- nl Neuro- grossly intact to observation

## 2024-05-27 NOTE — Telephone Encounter (Signed)
 FYI

## 2024-05-28 ENCOUNTER — Ambulatory Visit (INDEPENDENT_AMBULATORY_CARE_PROVIDER_SITE_OTHER): Admitting: Internal Medicine

## 2024-05-28 ENCOUNTER — Encounter: Payer: Self-pay | Admitting: Internal Medicine

## 2024-05-28 ENCOUNTER — Ambulatory Visit

## 2024-05-28 VITALS — BP 108/72 | HR 80 | Temp 97.8°F | Ht 67.0 in | Wt 147.4 lb

## 2024-05-28 DIAGNOSIS — G4733 Obstructive sleep apnea (adult) (pediatric): Secondary | ICD-10-CM | POA: Diagnosis not present

## 2024-05-28 DIAGNOSIS — R0609 Other forms of dyspnea: Secondary | ICD-10-CM

## 2024-05-28 MED ORDER — TRELEGY ELLIPTA 100-62.5-25 MCG/ACT IN AEPB: INHALATION_SPRAY | RESPIRATORY_TRACT | Status: DC

## 2024-05-28 NOTE — Telephone Encounter (Addendum)
 Amy- Dr. Corina notes in [facility] for your review.  She is also CPAP pt. I pulled her report, see below. She has f/u with you on 06/19/24

## 2024-05-28 NOTE — Patient Instructions (Signed)
 Order- CXR- dx dyspnea on exertion, cough  We can continue CPAP auto 5-11 If you can bring the SD card from your CPAP machine to our office, our staff can download it and return it to you. That will help us  ensure that your machine is functioning properly.  Order- sample Trelegy 10 inhaler    inhale 1 puf, then rinse your mouth, one time daily. See if this helps your breathing.

## 2024-05-30 ENCOUNTER — Ambulatory Visit: Attending: Internal Medicine | Admitting: Internal Medicine

## 2024-05-30 ENCOUNTER — Ambulatory Visit

## 2024-05-30 ENCOUNTER — Encounter: Payer: Self-pay | Admitting: Internal Medicine

## 2024-05-30 VITALS — BP 92/58 | HR 69 | Ht 67.0 in | Wt 146.6 lb

## 2024-05-30 DIAGNOSIS — I493 Ventricular premature depolarization: Secondary | ICD-10-CM

## 2024-05-30 MED ORDER — METOPROLOL TARTRATE 25 MG PO TABS: 25.0000 mg | ORAL_TABLET | Freq: Two times a day (BID) | ORAL | 3 refills | Status: AC

## 2024-05-30 NOTE — Progress Notes (Signed)
 HPI Ms. Christy Holland returns today for followup of her PVC's. She is a pleasant 53 yo woman with HTN with frequent and medically refractory PVC's who underwent catheter ablation 2 years ago. The patient has done well in the interim. She has rare palpitations in the interim. She has been on metoprolol  due to a h/o HTN. She has lost 100 lbs in the interim. Despite the weight loss she still has sleep apnea and DM.  Allergies  Allergen Reactions   Sulfamethoxazole-Trimethoprim Rash   Lactose Nausea And Vomiting and Other (See Comments)   Sulfa Antibiotics    Sulfasalazine Hives    Patient tolerates NSAIDS     Current Outpatient Medications  Medication Sig Dispense Refill   amoxicillin  (AMOXIL ) 500 MG capsule Take 500 mg by mouth 3 (three) times daily.     cetirizine (ZYRTEC) 10 MG tablet Take 1 tablet by mouth daily.     clonazePAM  (KLONOPIN ) 0.5 MG tablet TAKE 1 TO 4 TABLETS BY MOUTH FOR SLEEP AS NEEDED 90 tablet 5   clonazePAM  (KLONOPIN ) 1 MG tablet TAKE 1 TABLET(1 MG) BY MOUTH TWICE DAILY 30 tablet 1   DULoxetine (CYMBALTA) 30 MG capsule TAKE 1 CAPSULE(30 MG) BY MOUTH DAILY     fluticasone  (FLONASE ) 50 MCG/ACT nasal spray Place 1 spray into both nostrils daily. Begin by using 2 sprays in each nare daily for 3 to 5 days, then decrease to 1 spray in each nare daily. 15.8 mL 2   Fluticasone -Umeclidin-Vilant (TRELEGY ELLIPTA ) 100-62.5-25 MCG/ACT AEPB 1 sample given     ipratropium (ATROVENT ) 0.06 % nasal spray Place 2 sprays into both nostrils 3 (three) times daily. As needed for nasal congestion, runny nose 15 mL 1   meloxicam (MOBIC) 15 MG tablet Take 15 mg by mouth daily.     metFORMIN (GLUCOPHAGE-XR) 500 MG 24 hr tablet Take 500 mg by mouth 2 (two) times daily.     metoprolol  tartrate (LOPRESSOR ) 50 MG tablet Take 1 tablet (50 mg total) by mouth 2 (two) times daily. Please call [phone] to schedule an overdue appointment for future refills. Thank you. FINAL ATTEMPT. 30 tablet 0    Multiple Vitamin (MULTIVITAMIN) capsule Take 1 capsule by mouth daily.     MYRBETRIQ 25 MG TB24 tablet Take 25 mg by mouth daily.     omeprazole (PRILOSEC) 40 MG capsule TAKE 1 CAPSULE BY MOUTH BEFORE BREAKFAST AND BEFORE EVENING MEAL. TAKE 30 MINUTES BEFORE MEALS.     pregabalin (LYRICA) 50 MG capsule Take 50 mg by mouth 3 (three) times daily.     Semaglutide, 2 MG/DOSE, (OZEMPIC, 2 MG/DOSE,) 8 MG/3ML SOPN Inject 2 mg into the skin once a week.     spironolactone (ALDACTONE) 50 MG tablet Take 50 mg by mouth 2 (two) times daily.     topiramate (TOPAMAX) 50 MG tablet Take 1 tablet by mouth daily.     traZODone  (DESYREL ) 150 MG tablet Take 1 tablet (150 mg total) by mouth at bedtime. 90 tablet 3   tretinoin  (RETIN-A ) 0.05 % cream Apply topically at bedtime. For the 1st month apply 2 night weekly (m and [place]), after 1st month increase usage to 3 nights weekly (M-W-F) 45 g 2   Vitamin D, Ergocalciferol, (DRISDOL) 1.25 MG (50000 UT) CAPS capsule Take 50,000 Units by mouth every 7 (seven) days.     No current facility-administered medications for this visit.     Past Medical History:  Diagnosis Date   Chronic maxillary sinusitis 08/21/2017  Frequent PVCs    Holter monitor, abnormal 08/22/2019   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/21/2017   Rhinitis, chronic 08/21/2017   Sinusitis 02/13/2018   Snoring 08/21/2017    ROS:   All systems reviewed and negative except as noted in the HPI.   Past Surgical History:  Procedure Laterality Date   PVC ABLATION N/A 06/18/2021   Procedure: PVC ABLATION;  Surgeon: Waddell Christy ORN, MD;  Location: [facility]ASIVE [facility];  Service: Cardiovascular;  Laterality: N/A;     Family History  Problem Relation Age of Onset   Hypertension Mother    Hyperlipidemia Mother    Diabetes Mother    Hypertension Father    Hyperlipidemia Father    Diabetes Father    Heart attack Father 55   Stroke Father      Social History   Socioeconomic History   Marital  status: Married    Spouse name: Not on file   Number of children: 3   Years of education: Not on file   Highest education level: Not on file  Occupational History   Not on file  Tobacco Use   Smoking status: Never    Passive exposure: Never   Smokeless tobacco: Never  Vaping Use   Vaping status: Never Used  Substance and Sexual Activity   Alcohol use: No    Alcohol/week: 0.0 standard drinks of alcohol   Drug use: No   Sexual activity: Yes    Birth control/protection: None  Other Topics Concern   Not on file  Social History Narrative   Right Handed   No Caffeine Use    Social Drivers of Health   Financial Resource Strain: Low Risk  (11/18/2022)   Received from [facility]   Overall Financial Resource Strain (CARDIA)    Difficulty of Paying Living Expenses: Not hard at all  Food Insecurity: Low Risk  (03/07/2023)   Received from [facility]   Hunger Vital Sign    Within the past 12 months, you worried that your food would run out before you got money to buy more: Never true    Within the past 12 months, the food you bought just didn't last and you didn't have money to get more. : Never true  Transportation Needs: No Transportation Needs (03/07/2023)   Received from [facility]    In the past 12 months, has lack of reliable transportation kept you from medical appointments, meetings, work or from getting things needed for daily living? : No  Physical Activity: Not on file  Stress: Not on file  Social Connections: Unknown (02/20/2022)   Received from [facility]   Social Network    Social Network: Not on file  Intimate Partner Violence: Not At Risk (12/06/2023)   Received from [facility]   HITS    Over the last 12 months how often did your partner physically hurt you?: Never    Over the last 12 months how often did your partner insult you or talk down to you?: Never    Over the last 12 months how often did your partner threaten you with physical  harm?: Never    Over the last 12 months how often did your partner scream or curse at you?: Never     BP (!) 92/58   Pulse 69   Ht 5' 7 (1.702 m)   Wt 146 lb 9.6 oz (66.5 kg)   LMP 04/28/2024   SpO2 98%   BMI 22.96  kg/m   Physical Exam:  Well appearing NAD HEENT: Unremarkable Neck:  No JVD, no thyromegally Lymphatics:  No adenopathy Back:  No CVA tenderness Lungs:  Clear with no wheezes HEART:  Regular rate rhythm, no murmurs, no rubs, no clicks Abd:  soft, positive bowel sounds, no organomegally, no rebound, no guarding Ext:  2 plus pulses, no edema, no cyanosis, no clubbing Skin:  No rashes no nodules Neuro:  CN II through XII intact, motor grossly intact  EKG - nsr   Assess/Plan:  PVC's - she appears to be doing well. Her ECG looks good. She will decrease the metoprolol  to 25 mg twice daily and repeat a 3D zio. If this looks good, I'll dismiss her from our arrhythmia clinic. HTN - her bp is good on metoprolol .  We will reduce her dose. Obesity - She has lost over 100 lbs. Continue.    Christy Munira Polson,MD

## 2024-05-30 NOTE — Patient Instructions (Addendum)
 Medication Instructions:  Your physician has recommended you make the following change in your medication:  DECREASE Metoprolol  Tartrate (Lopressor ) to 25 mg twice daily  *If you need a refill on your cardiac medications before your next appointment, please call your pharmacy*  Lab Work: None ordered  If you have any lab test that is abnormal or we need to change your treatment, we will call you to review the results.  Testing/Procedures:                           Christy Holland- Long Term Monitor Instructions  Your physician has requested you wear a ZIO patch monitor for 3 days.  This is a single patch monitor. [employer] supplies one patch monitor per enrollment. Additional stickers are not available. Please do not apply patch if you will be having a Nuclear Stress Test,  Echocardiogram, Cardiac CT, MRI, or Chest Xray during the period you would be wearing the  monitor. The patch cannot be worn during these tests. You cannot remove and re-apply the  ZIO XT patch monitor.  Your ZIO patch monitor will be mailed 3 day USPS to your address on file. It may take 3-5 days  to receive your monitor after you have been enrolled.  Once you have received your monitor, please review the enclosed instructions. Your monitor  has already been registered assigning a specific monitor serial # to you.  Billing and Patient Assistance Program Information  We have supplied [employer] with any of your insurance information on file for billing purposes. [employer] offers a sliding scale Patient Assistance Program for patients that do not have  insurance, or whose insurance does not completely cover the cost of the ZIO monitor.  You must apply for the Patient Assistance Program to qualify for this discounted rate.  To apply, please call [employer] at [phone], select option 4, select option 2, ask to apply for  Patient Assistance Program. Meredeth will ask your household income, and how many people  are in your  household. They will quote your out-of-pocket cost based on that information.  [employer] will also be able to set up a 31-month, interest-free payment plan if needed.  Applying the monitor   Shave hair from upper left chest.  Hold abrader disc by orange tab. Rub abrader in 40 strokes over the upper left chest as  indicated in your monitor instructions.  Clean area with 4 enclosed alcohol pads. Let dry.  Apply patch as indicated in monitor instructions. Patch will be placed under collarbone on left  side of chest with arrow pointing upward.  Rub patch adhesive wings for 2 minutes. Remove white label marked 1. Remove the white  label marked 2. Rub patch adhesive wings for 2 additional minutes.  While looking in a mirror, press and release button in center of patch. A small green light will  flash 3-4 times. This will be your only indicator that the monitor has been turned on.  Do not shower for the first 24 hours. You may shower after the first 24 hours.  Press the button if you feel a symptom. You will hear a small click. Record Date, Time and  Symptom in the Patient Logbook.  When you are ready to remove the patch, follow instructions on the last 2 pages of Patient  Logbook. Stick patch monitor onto the last page of Patient Logbook.  Place Patient Logbook in the blue and white box. Use locking tab on  box and tape box closed  securely. The blue and white box has prepaid postage on it. Please place it in the mailbox as  soon as possible. Your physician should have your test results approximately 7 days after the  monitor has been mailed back to [facility].  Call [facility] Customer Care at [phone] if you have questions regarding  your ZIO XT patch monitor. Call them immediately if you see an orange light blinking on your  monitor.  If your monitor falls off in less than 4 days, contact our Monitor department at [phone].  If your monitor becomes loose or falls off after  4 days call [employer] at [phone] for  suggestions on securing your monitor   Follow-Up: At [facility], you and your health needs are our priority.  As part of our continuing mission to provide you with exceptional heart care, our providers are all part of one team.  This team includes your primary Cardiologist (physician) and Advanced Practice Providers or APPs (Physician Assistants and Nurse Practitioners) who all work together to provide you with the care you need, when you need it.  Your next appointment:   To be determined  Provider:   Danelle Birmingham, MD    Thank you for choosing [facility]!!   [phone]

## 2024-05-30 NOTE — Progress Notes (Unsigned)
Enrolled patient for a 3 day Zio XT monitor to patients home

## 2024-06-10 ENCOUNTER — Ambulatory Visit: Payer: Self-pay | Admitting: Internal Medicine

## 2024-06-13 ENCOUNTER — Encounter: Payer: BC Managed Care – PPO | Attending: Psychology | Admitting: Psychology

## 2024-06-13 DIAGNOSIS — G4733 Obstructive sleep apnea (adult) (pediatric): Secondary | ICD-10-CM | POA: Insufficient documentation

## 2024-06-13 DIAGNOSIS — F4323 Adjustment disorder with mixed anxiety and depressed mood: Secondary | ICD-10-CM | POA: Diagnosis present

## 2024-06-13 DIAGNOSIS — F5101 Primary insomnia: Secondary | ICD-10-CM | POA: Diagnosis not present

## 2024-06-13 NOTE — Progress Notes (Signed)
 Neuropsychology Visit  Patient:  Christy Holland   DOB: 08/15/1971  MR Number: [id number]  Location: [facility] [address] [place], [address] [place] [address] Dept: [phone]  Date of Service: 02/12/2024  Start: 2 PM End: 3 PM  Today's visit was an in person visit conducted in my outpatient clinic office with the patient myself present.  A digital scribe was used to assist in the note taking today and the process was explained to the patient of how it would be utilized and implemented and the fact that it was HIPAA compliant and would simply be an assistant note taking and would not be utilized in any way to derive diagnostic considerations or create contact.  Patient consents to allow digital scribe to be part of notetaking.  Duration of Service: 1 Hour  Provider/Observer:     Norleen JONELLE Asa PsyD  Chief Complaint:      Chief Complaint  Patient presents with   Sleeping Problem   Anxiety   Stress    Reason For Service:     Christy Holland is a 53 year old female referred for neuropsychological consultation by Greig Forbes, NP with [facility].  Patient was referred for therapeutic interventions around significant sleep disturbance and intrusive thinking affecting onset of sleep along with anxiety and negative thinking exacerbating onset of asleep.  Patient has been prescribed trazodone  to help with sleep and has been prescribed clonazepam  by her pulmonologist Quita Salt, MD.  Patient has also been prescribed Cymbalta by her PCP.  Patient has been diagnosed with obstructive sleep apnea with CPAP use 3 to 4 years ago initially by Quita Salt, MD and has also been followed by Greig Forbes, NP with [facility].   The patient reports that she is quite compliant with her CPAP use and uses it every night.  However, insomnia has always been described as a  significant issue.  Patient reports that this started around the time she had children and needing to be a light sleeper to keep an eye out for the kids.  Patient reports that to some degree her insomnia has always existed.  Patient notes that the slightest noise will wake her up even if she uses sleep medications.  Patient reports that she will become drowsy but then continue to wake up very easily.  Patient has never worked a third shift type job.  Patient reports that when she does wake up it is then hard to go back to sleep and that her mind goes around and around thinking about everything.  Patient reports that she wakes up and knows what is going to happen with difficulty going back to sleep which causes a lot of stress upon wakening.  Patient reports that when she wakes up in the middle the night that she knows that it is too late to take something to help her go back to sleep.  Patient reports that when she wakes up she will sit there thinking with racing/intrusive thoughts.  Patient reports that if this process goes on too long that she will eventually get up after an hour or so and find something to do hoping to get tired again.   Patient reports that this poor sleep pattern does appear to be causing some cognitive difficulties and she feels like she is in a constant state of brain fog.  Patient reports that she is chronically fatigued and feels tired all  the time and has an inability to concentrate and focus.  Patient notes that this has not been a lifelong issue with attention and cognitive difficulties and that it is directly related to the quality of sleep that she has.   Patient's sibling has a history of posttraumatic stress disorder, depression, intrusive racing thoughts and attention deficit disorder diagnoses.   Patient describes current stressors having to do with her ongoing difficulties with sleep, elderly mother with various health issues requiring caretaking responsibilities and  personal health challenges including managing her pain.  06/13/2024:  Reports ongoing difficulty with sleeping, characterized more as dozing than restful sleep. Describes the inability to achieve a restful state, leading to a persistent cycle. Reports worsening of symptoms over the past few months. Expresses concern about the physical toll of chronic sleep deprivation, including unremitting fatigue of both body and brain, as the body is never able to rest and rejuvenate. Worries about potential negative health consequences from lack of sleep.  Has engaged in some behavioral strategies for sleep hygiene, such as creating a quiet sleep area and removing distractions. Has attempted to be outside for sunrise and sunset, managing this about once or twice a week for sunrise and variably for sunset. Reports being able to get sun exposure during the day. Expresses willingness to be more intentional about increasing exposure to sunrise and sunset.  This pattern of poor sleep has been long-standing, particularly since the child-rearing years, which required a state of vigilance.  Treatment Interventions:  Psychoeducation was provided regarding the biological basis of sleep-wake cycles. The discussion covered the role of light, particularly red-spectrum light at sunrise and sunset, in regulating the body's internal clock. The physiology of human vision (rods and cones) and its connection to circadian rhythms was explained. The importance of melatonin, produced by the skin in response to sunlight, was reviewed, contrasting natural production with oral supplementation. The different stages of sleep (deep sleep for physical healing, REM sleep for learning and memory consolidation) and the negative impact of sleep deprivation on these functions were detailed. The session also addressed the detrimental effects of certain medications like benzodiazepines on sleep architecture, noting they can induce amnesia without providing  true restorative sleep.  However these medications can be used to try to initially achieve a better sleep architecture or sleep cycle but are not a long-term solution or intervention.  Patient does continue on her previous prescriptions of Klonopin  and Cymbalta without apparent side effects.  Specific behavioral interventions were reinforced.  Participation Level:   Active  Participation Quality:  Appropriate      Behavioral Observation:  Well Groomed, Alert, and Appropriate.   Current Psychosocial Factors: Reviewed current sleep difficulties and their perceived worsening. Discussed implementation of previous behavioral recommendations, including sleep environment and light exposure. Provided extensive psychoeducation on the neurobiology of sleep, the function of different sleep stages, and the rationale for behavioral interventions. Addressed the concept of sleep hygiene in detail, including avoiding clocks in the bedroom, establishing a consistent wake-up time without using a snooze button, and managing nighttime awakenings. A specific strategy for awakenings was provided: getting up, urinating, drinking a glass of ice-cold water quickly, and returning to bed without engaging in stimulating activities like watching TV. The importance of daily exercise, avoiding food close to bedtime, and maintaining a cool bedroom temperature was also reinforced.  Content of Session:   Reviewed current symptoms particularly around patterns of sleep with delayed onset of sleep and laying in bed for hours with mind  racing/wondering.  Effectiveness of Interventions: Engages well with psychoeducation and appears to understand the rationale behind the recommended strategies. Expresses motivation to be more consistent with interventions, particularly light exposure at sunrise and sunset.  Target Goals:   Work on improving chronic insomnia and sleep disturbance with significant impact on adjustment and coping  issues.  Goals Last Reviewed:   02/12/2024  Goals Addressed Today:    The primary goal addressed was improving sleep quality and duration through behavioral and environmental strategies. This involved a detailed review of sleep hygiene principles and the biological reasons for them. The session aimed to empower the individual with a deeper understanding of their condition and the non-pharmacological tools available to manage it, focusing on stacking multiple small interventions to achieve a cumulative positive effect.  Impression/Diagnosis:   Christy Holland is a 53 year old female referred for neuropsychological consultation by Greig Forbes, NP with [facility].  Patient was referred for therapeutic interventions around significant sleep disturbance and intrusive thinking affecting onset of sleep along with anxiety and negative thinking exacerbating onset of asleep.  Patient has been prescribed trazodone  to help with sleep and has been prescribed clonazepam  by her pulmonologist Quita Salt, MD.  Patient has also been prescribed Cymbalta by her PCP.  Patient has been diagnosed with obstructive sleep apnea with CPAP use 3 to 4 years ago initially by Quita Salt, MD and has also been followed by Greig Forbes, NP with [facility].  Diagnosis:   Adjustment disorder with mixed anxiety and depressed mood  Primary insomnia  OSA (obstructive sleep apnea)    Norleen Asa, Psy.D. Clinical Psychologist Neuropsychologist           Today we worked on sleeping issues including sleep hygiene and other foundational issues.  This was the first of our 3 scheduled therapeutic visits.  Complete report to an be found when this is finished.

## 2024-06-17 ENCOUNTER — Encounter: Payer: Self-pay | Admitting: *Deleted

## 2024-06-17 NOTE — Progress Notes (Signed)
 Letter mailed to the pt.

## 2024-06-18 NOTE — Patient Instructions (Signed)

## 2024-06-18 NOTE — Progress Notes (Unsigned)
 PATIENT: Christy Holland DOB: 1971-07-18  REASON FOR VISIT: follow up HISTORY FROM: patient  No chief complaint on file.    HISTORY OF PRESENT ILLNESS:  06/18/24 ALL: Christy Holland returns for follow up for OSA on CPAP and insomnia.   She continues CPAP nightly. Last saw Dr Neysa 05/2024 and doing well on therapy. Klonopin  was increased to 1.5mg  nightly.  Trazodone ?  Neurocog eval with Dr Corina not concerning for neurodegenerative causes of memory loss at this time. Most likely related to anxiety. He continues to see her for counseling.   05/23/2023 ALL:  Christy Holland returns for follow up for OSA on CPAP and insomnia. She reports continueing CPAP therapy most every night. She does feel that it helps significantly with snoring and sleep but she continues to fight with machine. She has a difficult time getting comfortable. She continues to have trouble getting to and staying asleep. She continues trazodone  150mg  daily. She is also taking clonazepam  1-2mg  nightly written by Dr Neysa. She was seen in consult with him for dyspnea 03/2021 and has continued yearly follow up. Despite taking both trazodone  and clonazepam , she continues to have trouble with insomnia. We referred her to psychology 10/2022. She has an appt with Dr Corina 07/2023. He does not routinely see insomnia.   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 She has lost about 30lbs. She is followed by [facility] for weight management.     11/17/2022 ALL: Christy Holland returns for follow up for OSA on CPAP and insomnia. She continues CPAP most nights. She is tolerating it well but admits pain has contributed to lower compliance. She denies concerns with machine or supplies.   She continues trazodone  150mg  QHS. She feels that it helps her doze but she is not sleeping well. She is having significant pain in right shoulder, knees and back. She is seeing orthopedics. She is planning rotator cuff repair in future. She has pinched  nerves in her neck and has right arm pain and tingling. She is currently followed by NS with [facility] and getting injection therapy. PT was not helpful. She also endorses some depression that could be contributing. We referred her to psychiatry in 2022, prior to being established with PCP, but she was not seen. [facility] is now [facility]. She has not discussed concerns of depression with PCP.   After reviewing her chart at completion of out visit, it was noted that she was seen in follow up with Dr Neysa, pulmonology, for CPAP management and insomnia. Clonazepam  0.5mg  started. PDMP shows she consistently fills 90 tablets every month.     11/17/2021 ALL: Christy Holland returns for follow up for OSA on CPAP. Her download below demonstrates that she is wearing her CPAP for 25 of the 30 days for a compliance of 83.3%. She wears the machine for >4 hours 80% of the time. Patient reports that she continues to get some sinus congestion and believes it's from the CPAP. She reports that she is cleaning the machine regularly.   She endorses that she continues to feel that she in not well rested or sleeping throughout the night. She was previously taking trazodone , she found this helpful for her. Will consider restarting this. Referral was placed to psychiatry for concerns of psychophysiologic insomnia. She has not scheduled an appt.   Patient states that she has been established with a PCP at [facility].     02/11/2021 ALL: Christy Holland returns for follow up for OSA on  CPAP and insomnia. She continues fairly regular use of CPAP therapy. She also continues trazodone  150mg  daily. She continued to have difficulty sleeping and was encouraged to establish care with PCP and to consider psychiatry referral for management of anxiety. She has not yet scheduled an appt with PCP. She tried to get an appt with psychiatry but was having a hard time finding someone to take her insurance. She feels that she is doing  ok. Still having difficulty with sleep. No concerns with CPAP. Trazodone  is helping.   Compliance report dated 01/11/2021-02/10/2021 revelas that she used CPAP 25/30 days. 4 hour compliance was 77.4%. Residual AHI was 2.4/hr on 5-11cmH20. No significant leak noted.   11/12/2020 ALL:  Christy Holland returns for follow up on OSA and insomnia. We increased trazodone  to 150mg  at bedtime in 07/2020. It helps to get her to sleep but continuing to have trouble staying asleep. She usually has to use the bathroom and then has difficulty getting back to sleep. She gets 3-4 ours each night. She feels that her mind starts working and she can't return to sleep. She continues to care for her mother who is ill. She travels to [facility] often to take care of her. Sometimes she does not take her CPAP machine with her. She denies any concerns with CPAP machine or supplies. No difficulty with tolerability of therapy.   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 Cardiology workup showed 30% PVC burden but no other significant findings. She was started on flecainide  and metoprolol  but discontinued these medications several months ago. She is uncertain why, She does not have PCP. Last labs in 2018.   Compliance report dated 10/03/2020 through 11/03/2020 reveals that she used CPAP 20 one of the past 30 days for compliance of 65.6%.  She used CPAP greater than 4 hours 59.4%.  Residual AHI was 2.7 on 5 to 11 cm of water.  There was no significant leak noted.  08/10/2020 ALL: Christy Holland is a 53 y.o. female here today for follow up for OSA on CPAP. She has continues intermittent use of CPAP therapy. She continues trazodone  100mg  for insomnia. She feels that she is able to go to sleep but unable to stay asleep. She usually has to use the restroom and is unable to get back to sleep. She usually takes trazodone  around 9:30, goes to bed around 10pm. Sometimes she may fall asleep watching TV, sometimes not. She usually  wakes around 2am and is awake for about an hour.  Sometimes she may drift back off to sleep around 3 AM and then wakes at 5:30 AM.  She does not feel anxious or depressed throughout the day.  She admits to inconsistent sleep patterns as her mother is ill.  She has been traveling to [facility] to help take care of her.  She has not been using CPAP when traveling.  Compliance report dated 07/06/2020 through 08/04/2020 reveals that she used CPAP 19 of the past 30 days for compliance of 63%.  She is CPAP greater than 4 hours 53% of the time.  Residual AHI was 2.6 on 5 to 11 cm of water.  No significant leak noted.  HISTORY: (copied from my note on 02/05/2020)  Christy Holland is a 53 y.o. female here today for follow up.  She is doing well with CPAP therapy.  She is using her machine every night.  She continues to have difficulty with insomnia.  We started her on trazodone  50  mg at bedtime.  This has been helpful in helping her get to sleep but she continues to have trouble staying asleep.  She is interested in increasing the dose.  She has tried Benadryl and melatonin in the past with no relief.  She does not feel that anxiety or depression are contributing but is followed closely by her primary care provider.   Compliance report dated 01/06/2020 through 02/04/2020 reveals that she used CPAP 30 of the past 30 days for compliance of 100%.  She used CPAP greater than 4 hours 73.3% of the time.  Average usage was 4 hours and 59 minutes.  Residual AHI was 3.8 on 5 to 11 cm of water pressure and an EPR of 3.  There was no significant leak noted.   HISTORY: (copied from my note on 11/07/2019)   Christy Holland is a 53 y.o. female here today for follow up for OSA on CPAP.  She reports that she is doing fairly well on CPAP therapy.  She is working towards nightly use and meeting the 4-hour recommendation.  She denies difficulty with CPAP machine.  She does continue to note insomnia.  She has tried over-the-counter options  including melatonin and Benadryl with no success.   Compliance report dated 10/08/2019 through 11/06/2019 reveals that she used CPAP 28 of the last 30 days for compliance of 93.3%.  She used CPAP greater than 4 hours 83% of time.  Average usage was 5 hours and 27 minutes.  Residual AHI was 3.8 on 5 to 11 cm of water and an EPR of 3.  There was no significant leak noted.   REVIEW OF SYSTEMS: Out of a complete 14 system review of symptoms, the patient complains only of the following symptoms, insomnia, chronic pain of multiple sites and all other reviewed systems are negative. Sinus congestions Insomnia  ESS: 18/24, previously 14/24   ALLERGIES: Allergies  Allergen Reactions   Sulfamethoxazole-Trimethoprim Rash   Lactose Nausea And Vomiting and Other (See Comments)   Sulfa Antibiotics    Sulfasalazine Hives    Patient tolerates NSAIDS    HOME MEDICATIONS: Outpatient Medications Prior to Visit  Medication Sig Dispense Refill   amoxicillin  (AMOXIL ) 500 MG capsule Take 500 mg by mouth 3 (three) times daily. (Patient not taking: Reported on 05/30/2024)     cetirizine (ZYRTEC) 10 MG tablet Take 1 tablet by mouth daily.     clonazePAM  (KLONOPIN ) 0.5 MG tablet TAKE 1 TO 4 TABLETS BY MOUTH FOR SLEEP AS NEEDED 90 tablet 5   clonazePAM  (KLONOPIN ) 1 MG tablet TAKE 1 TABLET(1 MG) BY MOUTH TWICE DAILY 30 tablet 1   DULoxetine (CYMBALTA) 30 MG capsule TAKE 1 CAPSULE(30 MG) BY MOUTH DAILY     fluticasone  (FLONASE ) 50 MCG/ACT nasal spray Place 1 spray into both nostrils daily. Begin by using 2 sprays in each nare daily for 3 to 5 days, then decrease to 1 spray in each nare daily. 15.8 mL 2   Fluticasone -Umeclidin-Vilant (TRELEGY ELLIPTA ) 100-62.5-25 MCG/ACT AEPB 1 sample given     ipratropium (ATROVENT ) 0.06 % nasal spray Place 2 sprays into both nostrils 3 (three) times daily. As needed for nasal congestion, runny nose 15 mL 1   meloxicam (MOBIC) 15 MG tablet Take 15 mg by mouth daily.     metFORMIN  (GLUCOPHAGE-XR) 500 MG 24 hr tablet Take 500 mg by mouth 2 (two) times daily.     metoprolol  tartrate (LOPRESSOR ) 25 MG tablet Take 1 tablet (25 mg total) by mouth  2 (two) times daily. 180 tablet 3   Multiple Vitamin (MULTIVITAMIN) capsule Take 1 capsule by mouth daily.     MYRBETRIQ 25 MG TB24 tablet Take 25 mg by mouth daily.     omeprazole (PRILOSEC) 40 MG capsule TAKE 1 CAPSULE BY MOUTH BEFORE BREAKFAST AND BEFORE EVENING MEAL. TAKE 30 MINUTES BEFORE MEALS.     pregabalin (LYRICA) 50 MG capsule Take 50 mg by mouth 3 (three) times daily.     Semaglutide, 2 MG/DOSE, (OZEMPIC, 2 MG/DOSE,) 8 MG/3ML SOPN Inject 2 mg into the skin once a week. (Patient not taking: Reported on 05/30/2024)     spironolactone (ALDACTONE) 50 MG tablet Take 50 mg by mouth 2 (two) times daily. (Patient not taking: Reported on 05/30/2024)     topiramate (TOPAMAX) 50 MG tablet Take 1 tablet by mouth daily.     traZODone  (DESYREL ) 150 MG tablet Take 1 tablet (150 mg total) by mouth at bedtime. 90 tablet 3   tretinoin  (RETIN-A ) 0.05 % cream Apply topically at bedtime. For the 1st month apply 2 night weekly (m and [place]), after 1st month increase usage to 3 nights weekly (M-W-F) 45 g 2   Vitamin D, Ergocalciferol, (DRISDOL) 1.25 MG (50000 UT) CAPS capsule Take 50,000 Units by mouth every 7 (seven) days.     No facility-administered medications prior to visit.    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   Rhinitis, chronic 08/21/2017   Sinusitis 02/13/2018   Snoring 08/21/2017    PAST SURGICAL HISTORY: Past Surgical History:  Procedure Laterality Date   PVC ABLATION N/A 06/18/2021   Procedure: PVC ABLATION;  Surgeon: Waddell Danelle ORN, MD;  Location: [facility];  Service: Cardiovascular;  Laterality: N/A;    FAMILY HISTORY: Family History  Problem  Relation Age of Onset   Hypertension Mother    Hyperlipidemia Mother    Diabetes Mother    Hypertension Father    Hyperlipidemia Father    Diabetes Father    Heart attack Father 53   Stroke Father     SOCIAL HISTORY: Social History   Socioeconomic History   Marital status: Married    Spouse name: Not on file   Number of children: 3   Years of education: Not on file   Highest education level: Not on file  Occupational History   Not on file  Tobacco Use   Smoking status: Never    Passive exposure: Never   Smokeless tobacco: Never  Vaping Use   Vaping status: Never Used  Substance and Sexual Activity   Alcohol use: No    Alcohol/week: 0.0 standard drinks of alcohol   Drug use: No   Sexual activity: Yes    Birth control/protection: None  Other Topics Concern   Not on file  Social History Narrative   Right Handed   No Caffeine Use    Social Drivers of Health   Financial Resource Strain: Low Risk  (11/18/2022)   Received from [facility]   Overall Financial Resource Strain (CARDIA)    Difficulty of Paying Living Expenses: Not hard at all  Food Insecurity: Low Risk  (03/07/2023)   Received from [facility]   Hunger Vital Sign    Within the past 12 months, you worried that your food would run out before you got money to buy more:  Never true    Within the past 12 months, the food you bought just didn't last and you didn't have money to get more. : Never true  Transportation Needs: No Transportation Needs (03/07/2023)   Received from [facility]    In the past 12 months, has lack of reliable transportation kept you from medical appointments, meetings, work or from getting things needed for daily living? : No  Physical Activity: Not on file  Stress: Not on file  Social Connections: Unknown (02/20/2022)   [facility] from [facility]   Social Network    Social Network: Not on file  Intimate Partner Violence: Not At Risk (12/06/2023)   Received from  [facility]   HITS    Over the last 12 months how often did your partner physically hurt you?: Never    Over the last 12 months how often did your partner insult you or talk down to you?: Never    Over the last 12 months how often did your partner threaten you with physical harm?: Never    Over the last 12 months how often did your partner scream or curse at you?: Never     PHYSICAL EXAM  There were no vitals filed for this visit.     There is no height or weight on file to calculate BMI.  Generalized: Well developed, in no acute distress  Cardiology: normal rate and rhythm, PVC's, no murmur noted Respiratory: clear to auscultation bilaterally  Neurological examination  Mentation: Alert oriented to time, place, history taking. Follows all commands speech and language fluent Cranial nerve II-XII: Pupils were equal round reactive to light. Extraocular movements were full, visual field were full  Motor: The motor testing reveals 5 over 5 strength of all 4 extremities. Good symmetric motor tone is noted throughout.  Gait and station: Gait is normal.    DIAGNOSTIC DATA (LABS, IMAGING, TESTING) - I reviewed patient records, labs, notes, testing and imaging myself where available.      No data to display           Lab Results  Component Value Date   WBC 5.4 06/10/2021   HGB 13.0 06/10/2021   HCT 38.8 06/10/2021   MCV 89 06/10/2021   PLT 196 06/10/2021      Component Value Date/Time   NA 140 06/10/2021 1248   K 4.2 06/10/2021 1248   CL 102 06/10/2021 1248   CO2 29 06/10/2021 1248   GLUCOSE 101 (H) 06/10/2021 1248   BUN 7 06/10/2021 1248   CREATININE 0.77 06/10/2021 1248   CALCIUM 9.4 06/10/2021 1248   PROT 6.9 07/31/2017 1712   ALBUMIN 4.2 07/31/2017 1712   AST 26 07/31/2017 1712   ALT 11 07/31/2017 1712   ALKPHOS 63 07/31/2017 1712   BILITOT <0.2 07/31/2017 1712   GFRNONAA 90 07/31/2017 1712   GFRAA 104 07/31/2017 1712   No results found for: CHOL, HDL,  LDLCALC, LDLDIRECT, TRIG, CHOLHDL No results found for: YHAJ8R No results found for: VITAMINB12 Lab Results  Component Value Date   TSH 1.450 07/31/2017     ASSESSMENT AND PLAN 53 y.o. year old female  has a past medical history of Chronic maxillary sinusitis (08/21/2017), Frequent PVCs, Holter monitor, abnormal (08/22/2019), Hypertrophy of tonsil and adenoid (08/21/2017), Rhinitis, chronic (08/21/2017), Sinusitis (02/13/2018), and Snoring (08/21/2017). here with   No diagnosis found.   SYNIA DOUGLASS is doing fairly well with compliance. Current download shows optimal compliance with daily and 4  hour use. We will update supply orders as indicated. Risks of untreated sleep apnea review and education materials provided.  She continues to struggle with insomnia.  She does feel that trazodone  helps her doze but she is not getting restful sleep. We have reviewed sleep hygiene and I have encouraged her to work on these at home. We previously referred her to psychiatry for insomnia and concerns of depression prior to being established with PCP. She could not find an affordable provider at that time.  I have encouraged her to see psychology for CBT and discuss depression with PCP. She was scheduled with Dr Corina but I will send to another psychologist as he does not routinely see patients for insomnia. Could consider psychiatry in the future if needed. She denies SI/HI. I have discussed concerns of taking clonazepam  with trazodone , and pregabalin. This could contribute to excessive daytime sleepiness. Could consider additional sleep testing but would need to discontinue these medications. She was advised to discuss care plan with Dr Neysa. I would advise she see either our office or Dr Neysa for CPAP and insomnia follow up but not both. Healthy lifestyle habits encouraged. She will follow up pending next visit with Dr Neysa. She verbalizes understanding and agreement with this plan.     No orders of the defined types were placed in this encounter.    No orders of the defined types were placed in this encounter.    I spent 30 minutes of face-to-face and non-face-to-face time with patient.  This included previsit chart review, lab review, study review, order entry, electronic health record documentation, patient education.   Greig Forbes, FNP-C 06/18/2024, 1:20 PM [facility] [address], [address] [place], [place] [address] [phone]

## 2024-06-19 ENCOUNTER — Encounter: Payer: Self-pay | Admitting: Family Medicine

## 2024-06-19 ENCOUNTER — Telehealth: Payer: Self-pay

## 2024-06-19 ENCOUNTER — Ambulatory Visit (INDEPENDENT_AMBULATORY_CARE_PROVIDER_SITE_OTHER): Admitting: Family Medicine

## 2024-06-19 VITALS — BP 109/72 | HR 79 | Ht 67.0 in | Wt 145.2 lb

## 2024-06-19 DIAGNOSIS — R413 Other amnesia: Secondary | ICD-10-CM | POA: Diagnosis not present

## 2024-06-19 DIAGNOSIS — G4733 Obstructive sleep apnea (adult) (pediatric): Secondary | ICD-10-CM | POA: Diagnosis not present

## 2024-06-19 DIAGNOSIS — G4700 Insomnia, unspecified: Secondary | ICD-10-CM

## 2024-06-19 DIAGNOSIS — F418 Other specified anxiety disorders: Secondary | ICD-10-CM | POA: Diagnosis not present

## 2024-06-19 NOTE — Telephone Encounter (Signed)
 Called pt and asked her to bring her CPAP Machine to her appointment on today 06/19/2024.

## 2024-07-03 ENCOUNTER — Ambulatory Visit: Payer: Self-pay | Admitting: Family Medicine

## 2024-07-09 LAB — APOE ALZHEIMER'S RISK

## 2024-07-09 LAB — ATN PROFILE
A -- Beta-amyloid 42/40 Ratio: 0.121 (ref >0.102)
Beta-amyloid 40: 182.01 pg/mL
Beta-amyloid 42: 21.98 pg/mL
N -- NfL, Plasma: 1.33 pg/mL (ref 0.00–2.99)
T -- p-tau181: 0.48 pg/mL (ref 0.00–0.95)

## 2024-07-15 NOTE — Therapy (Incomplete)
 OUTPATIENT PHYSICAL THERAPY CERVICAL/ LUMBAR EVALUATION   Patient Name: Christy Holland MRN: [id number] DOB:1971/08/12, 53 y.o., female Today's Date: 07/16/2024  END OF SESSION:  PT End of Session - 07/16/24 1220     Visit Number 1    Date for Recertification  09/13/24    Authorization Type [employer] / [employer] gel[employer] requested)    PT Start Time [phone]    PT Stop Time 1023    PT Time Calculation (min) 42 min    Activity Tolerance Patient tolerated treatment well    Behavior During Therapy [facility] for tasks assessed/performed          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/2017   Rhinitis, chronic 08/21/2017   Sinusitis 02/13/2018   Snoring 08/21/2017   Past Surgical History:  Procedure Laterality Date   PVC ABLATION N/A 06/18/2021   Procedure: PVC ABLATION;  Surgeon: Waddell Danelle ORN, MD;  Location: [facility];  Service: Cardiovascular;  Laterality: N/A;   Patient Active Problem List   Diagnosis Date Noted   Menorrhagia 05/01/2024   Osteoarthritis of right knee 01/26/2024   Bilateral carpal tunnel syndrome 12/27/2023   Arthralgia of right knee 11/22/2023   Low back pain 11/14/2023   Neck pain 11/14/2023   Episodic recurrent vertigo 07/31/2023   Balance disorder 07/17/2023   Dizziness 07/17/2023   Carpal tunnel syndrome on both sides 04/04/2022   Chronic right shoulder pain 04/04/2022   Cervical radiculopathy 04/04/2022   Class 2 severe obesity due to excess calories with serious comorbidity and body mass index (BMI) of 36.0 to 36.9 in adult 03/17/2022   History of cardiac radiofrequency ablation 01/18/2022   Insomnia 11/12/2021   Mass of left breast 10/19/2021   Acne vulgaris 09/28/2021   Dyspnea on exertion 04/03/2021   Holter monitor, abnormal 08/22/2019   Frequent PVCs 03/01/2019   Sinusitis 02/13/2018   Chronic  maxillary sinusitis 08/21/2017   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/21/2017   Rhinitis, chronic 08/21/2017   OSA (obstructive sleep apnea) 08/21/2017    PCP: Leonce Carola PARAS, PA-C   REFERRING PROVIDER: Faye Lauraine PARAS, FNP  REFERRING DIAG:  Diagnosis  M54.12 (ICD-10-CM) - Radiculopathy, cervical region    THERAPY DIAG:  Cervicalgia  Muscle weakness (generalized)  Other low back pain  Cramp and spasm  Rationale for Evaluation and Treatment: Rehabilitation  ONSET DATE: Chronic   SUBJECTIVE:  SUBJECTIVE STATEMENT: Patient presents with chronic neck pain that hurts constantly. She has done injections and nerve blocks in the past. She reports having DDD and compressed nerves. It is more challenging looking to the right than the left. She reports numbness and tingling down her arms to all of her fingers. She reports differences in her grip strength and she drops objects. Pain impacts her sleep at night. She has daily headaches that last multiple days when she is having a bad flare up.She also reports chronic back pain that interferes with her standing and sitting tolerance. She has numbness and tingling in both of her legs that goes all the way down to her feet.  Hand dominance: Right  PERTINENT HISTORY:  Hx back neck and right shoulder pain; OA of right knee  PAIN:  Are you having pain? Yes: NPRS scale: 8(currently) 15(worst) / 10 Pain location: bilateral cervical spine starting at base of head Pain description: achy; sharp Aggravating factors: cervical rotation (Rt > Lt); driving; standing> 5 mins; sitting > 5-10 mins; walking> 5-58mins Relieving factors: Ice/ Heat; stretching  PRECAUTIONS: None  RED FLAGS: None     WEIGHT BEARING RESTRICTIONS: No  FALLS:  Has  patient fallen in last 6 months? 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  DIAGNOSTIC FINDINGS:  Cervical Spine MR 12/29/2013 IMPRESSION:  1. Cervical spondylosis and degenerative disc disease, causing  moderate impingement at C5-6 and C6-7 as detailed above.   PATIENT SURVEYS:  NDI: 34/50 68%  Minimum Detectable Change (90% confidence): 5 points or 10% points  ODI: 35/50 70%  COGNITION: Overall cognitive status: Within functional limits for tasks assessed  SENSATION: Numbness and tingling in UE and LEs 0/4 on light touch sensation bilateral but able to feel deeper pressure sensation  POSTURE: rounded shoulders and forward head   CERVICAL ROM: * pain  Active ROM A/PROM (deg) eval  Flexion 22*  Extension 25*  Right lateral flexion 20*  Left lateral flexion 12*  Right rotation 30*  Left rotatio/n 30*   (Blank row/s = not tested)    UPPER  EXTREMITY MMT: Grossly 4-/5 pain with shoulder abduction & flexion testing Grip strength to be tested    LOWER  EXTREMITY FFU:Hmnddob 4- to 4 /5 bilateral  (very weak hip flexors, abductors)   FUNCTIONAL TESTS:  5 times sit to stand: 23.40sec hands on knees  TREATMENT DATE: 07/16/2024 Initial Evaluation & HEP created  Sitting posture while  driving   PATIENT EDUCATION:  Education details: Provided patient education and discussion on quality of life and stress management. Discussed motivators, challenges, and goals for future.  Added aquatics & DN doc to patient instructions Person educated: Patient Education method: Explanation, Demonstration, and Handouts Education comprehension: verbalized understanding, returned demonstration, and needs further education  HOME EXERCISE PROGRAM: Access Code: [email or web address] URL: [email or web address] Date: 07/16/2024 Prepared by: Kristeen Sar  Exercises - Seated Scapular Retraction  - 1 x daily - 7 x weekly - 1 sets - 10 reps - Seated Cervical Retraction  - 1 x daily - 7 x weekly - 1 sets - 10 reps - Sidelying Thoracic Rotation with Open Book  - 1 x daily - 7 x weekly - 1 sets - 10 reps  ASSESSMENT:  CLINICAL IMPRESSION: Patient is a 53 y.o. female who was seen today for physical therapy evaluation and treatment for neck and back pain. Lashaya presents with chronic neck and back pain there interferes with her functional mobility and ability to perform work duties. Her cervical rotation is limited and painful and affects her ability to look in her blind spots. She reports having daily headaches that sometimes last multiple days if she is having a bad flare up. She reports numbness and tingling in her upper and lower extremities that also affects her grip strength. Based on evaluation noted poor posture, muscle weakness, and decreased ROM. When testing light touch sensation patient did not feel any points on her thigh and lower leg bilaterally . She felt deep pressure sensation. Educated patient to return to her MD to get a nerve conduction study to see if she has neuropathy. She said she was going to get tested for it in the past, but never got it done. She understood the safety aspect involved of not having full sensation in her lower extremities. Patient will benefit from skilled PT to  address the below impairments and improve overall function.   OBJECTIVE IMPAIRMENTS: decreased endurance, decreased mobility, difficulty walking, decreased ROM, decreased strength, hypomobility, increased fascial restrictions, increased muscle spasms, impaired flexibility, postural dysfunction, and pain.   ACTIVITY LIMITATIONS: carrying, lifting, bending, sitting, standing, squatting, sleeping, stairs, transfers, dressing, reach over head, and hygiene/grooming  PARTICIPATION LIMITATIONS: meal prep, cleaning, laundry, driving, shopping, community activity, and occupation  PERSONAL FACTORS: Age, Fitness, Time since onset of injury/illness/exacerbation, and 1-2 comorbidities: OA are also affecting patient's functional outcome.   REHAB POTENTIAL: Good  CLINICAL DECISION MAKING: Evolving/moderate complexity  EVALUATION COMPLEXITY: Moderate   GOALS: Goals reviewed with patient? Yes  SHORT TERM GOALS: Target date: 08/13/2024  Patient will be independent with initial HEP. Baseline:  Goal status: INITIAL  2.  Patient will report > or = to 30% improvement in sleep quality since starting PT. Baseline: disturbed for 3-4 hours Goal status: INITIAL  3.  Patient will demonstrate correct seated and standing posture with no verbal cues to decrease mechanical strain. Baseline:  Goal status: INITIAL  4.  Patient will be able to stand for at least 10 mins with < or = to 5/10 back pain for performance of ADLs and house chores.  Baseline: 5 mins Goal status: INITIAL    LONG TERM GOALS: Target date: 09/13/2024  Patient will demonstrate independence in advanced HEP. Baseline:  Goal status: INITIAL  2.  Patient will report > or = to 60% improvement in neck and back pain since starting PT. Baseline:  Goal status: INITIAL  3.  Patient will complete 5STS in <  or= to 19 sec due to improved muscle strength and to be closer to age predicted norms. Baseline: 23.40 sec Goal status:  INITIAL   4.  Patient to score < or = to 29/50 on NDI due to improved function and decreased disability.  Baseline: 34/50  Goal status: INITIAL  5.  Patient to score < or = to 25 on ODI due to decreased self perceived disability.  Baseline: 35/50 Goal status: INITIAL  6.  Patient will demonstrate improved cervical ROM by 5-8 degrees to be able to look in her blind spot while driving. Baseline:  Goal status: INITIAL   PLAN:  PT FREQUENCY: 2x/week  PT DURATION: 8 weeks  PLANNED INTERVENTIONS: 97164- PT Re-evaluation, 97110-Therapeutic exercises, 97530- Therapeutic activity, 97112- Neuromuscular re-education, 97535- Self Care, [id number]- Manual therapy, [phone]- Gait training, [phone]- Canalith repositioning, [phone]- Aquatic Therapy, [phone]- Electrical stimulation (unattended), [phone]- Electrical stimulation (manual), [id number]- Vasopneumatic device, [id number]- Ultrasound, [id number]- Traction (mechanical), [id number]- Ionotophoresis 4mg /ml Dexamethasone, [id number] (1-2 muscles), [id number] (3+ muscles)- Dry Needling, Patient/Family education, Balance training, Stair training, Taping, Joint mobilization, Joint manipulation, Spinal manipulation, Spinal mobilization, Vestibular training, Cryotherapy, and Moist heat  PLAN FOR NEXT SESSION: Review HEP; initiate core exercises; cervical melt method; possibly cervical traction; assess grip strength; nerve glides   Kristeen Sar, PT 07/16/24 12:21 PM [facility] [address], [address] [place], [place] [address] Phone # [phone] Fax [phone]

## 2024-07-16 ENCOUNTER — Other Ambulatory Visit: Payer: Self-pay

## 2024-07-16 ENCOUNTER — Encounter: Payer: Self-pay | Admitting: Physical Therapy

## 2024-07-16 ENCOUNTER — Ambulatory Visit: Attending: Family Medicine | Admitting: Physical Therapy

## 2024-07-16 DIAGNOSIS — R252 Cramp and spasm: Secondary | ICD-10-CM | POA: Insufficient documentation

## 2024-07-16 DIAGNOSIS — M6281 Muscle weakness (generalized): Secondary | ICD-10-CM | POA: Diagnosis present

## 2024-07-16 DIAGNOSIS — M542 Cervicalgia: Secondary | ICD-10-CM | POA: Diagnosis present

## 2024-07-16 DIAGNOSIS — M5459 Other low back pain: Secondary | ICD-10-CM | POA: Insufficient documentation

## 2024-07-16 NOTE — Patient Instructions (Addendum)
 [facility]  Welcome to [facility]! Here you will find all the information you will need regarding your pool therapy. If you have further questions at any time, please call our office at [phone]. After completing your initial evaluation in the [facility], you may be eligible to complete a portion of your therapy in the pool. A typical week of therapy will consist of 1-2 typical physical therapy visits at our [facility] location and an additional session of therapy in the pool located at the [facility] at [facility]. [address] [place] [address]. The phone number at the pool site is [phone]. Please call this number if you are running late or need to cancel your appointment.  Check-in on MyChart then meet your therapist at the pool deck. (If you can't access MyChart, you may check in with the therapist at the pool deck.)   Each session will last approximately 45 minutes. All scheduling and payments for aquatic therapy sessions, including cancelations, will be done through our [facility] location.  To be eligible for aquatic therapy, these criteria must be met: You must be able to independently change in the locker room and get to the pool deck. A caregiver can come with you to help if needed however they do need to be the same sex to enter the locker room. Or you may change in a bathroom privately with opposite sex caregiver if needed. There are benches for a caregiver to sit on next to the pool.  Handicap parking is available in the front and there is a drop off option for even closer accessibility.  Please arrive 15 minutes prior to your appointment to prepare for your pool session. You must sign in at the front desk upon your arrival. Please be sure to attend to any toileting needs prior to entering the pool. Locker rooms for changing are available.  There is direct access to the pool deck from the locker  room. You can lock your belongings in a locker or bring them with you poolside. Your therapist will greet you on the pool deck. There may be other swimmers in the pool at the same time but your session is one-on-one with the therapist.    What to Expect Arrive 15 min early for your appointment and check in with rehab front desk. Please limit use of body lotions and hair products before entering the pool. Locker rooms are available for showering, changing and toileting. Appointments are 45 minutes with your therapist. (This does not include changing times) The pool is approximately 500 feet from the nearest parking lot. There are benches and chairs along the walk. Please bring a support person if you need assistance traveling the distanceto the pool or assistance with changing/toileting. Stairs with handrails as well as a lift chair are available at the pool.  Depth is 3'6"-4'8" and temperature is between 88-90 degrees. The pool deck is tile flooring and gets slippery, water shoes are strongly encouraged but not required. Please wear a bathing suit or athletic shorts and a t-shirt. Recommended to bring your own towel. Severe weather:Thunder or lightning results in closure of the pool deck for 30 minutes and is extended with each incidence. Your appointment may be moved to land or canceled with the option to reschedule. Tell your therapist if you have any of the following: Open wounds Active infection Fear of water Bowel or bladder incontinence  Benefits of Aquatic Therapy:  Reduces Stress on Joints  and Muscles  The buoyancy of water supports body weight, making movement easier and less painful.  Builds Strength and Stability  The viscosity of water provides resistance that allows individuals to strengthen muscles while also providing a safe environment to improve balance and coordination.  Promotes Relaxation  The warm water and the feeling of being supported can help reduce stress and be  beneficial for overall well-being.   Trigger Point Dry Needling  What is Trigger Point Dry Needling (DN)? DN is a physical therapy technique used to treat muscle pain and dysfunction. Specifically, DN helps deactivate muscle trigger points (muscle knots).  A thin filiform needle is used to penetrate the skin and stimulate the underlying trigger point. The goal is for a local twitch response (LTR) to occur and for the trigger point to relax. No medication of any kind is injected during the procedure.  Benefits of Trigger Point Dry Needling Reduces localized tension and stiffness promoting muscle function. Promotes range of motion and flexibility of the area being treated. Relieves localized and referred muscle related pain.  Promotes localized blood flow. Benefits of DN increase when performed with formal therapy including strengthening and stretching.   What Does Trigger Point Dry Needling Feel Like?  The procedure feels different for each individual patient. Some patients report that they do not actually feel the needle enter the skin and overall the process is not painful. Very mild bleeding may occur. However, many patients feel a deep cramping in the muscle in which the needle was inserted. This is the local twitch response.   How Will I feel after the treatment? Soreness is normal, and the onset of soreness may not occur for a few hours. Typically this soreness does not last longer than two days.  Bruising is uncommon, however; ice can be used to decrease any possible bruising.  In rare cases feeling tired or nauseous after the treatment is normal. In addition, your symptoms may get worse before they get better, this period will typically not last longer than 24 hours.   What Can I do After My Treatment? Increase your hydration by drinking more water for the next 24 hours.  You may place ice or heat on the areas treated that have become sore, however, do not use heat on inflamed or  bruised areas. Heat often brings more relief post needling. You can continue your regular activities, but vigorous activity is not recommended initially after the treatment for 24 hours. DN is best combined with other physical therapy such as strengthening, stretching, and other therapies.   What are the complications? While your therapist has had extensive training in minimizing the risks of trigger point dry needling, it is important to understand the risks of any procedure.  Risks include bleeding, pain, fatigue, hematoma, infection, vertigo, nausea or nerve involvement. Monitor for any changes to your skin or sensation. Contact your therapist or MD with concerns.  A rare but serious complication is a pneumothorax over or near your middle and upper chest and back If you have dry needling in this area, monitor for the following symptoms: Shortness of breath on exertion and/or Difficulty taking a deep breath and/or Chest Pain and/or A dry cough If any of the above symptoms develop, please go to the nearest emergency room or call 911. Tell them you had dry needling over your thorax and report any symptoms you are having. Please follow-up with your treating therapist after you complete the medical evaluation.

## 2024-07-18 ENCOUNTER — Encounter (HOSPITAL_BASED_OUTPATIENT_CLINIC_OR_DEPARTMENT_OTHER): Attending: Family Medicine | Admitting: Physical Therapy

## 2024-07-18 ENCOUNTER — Encounter (HOSPITAL_BASED_OUTPATIENT_CLINIC_OR_DEPARTMENT_OTHER): Payer: Self-pay | Admitting: Physical Therapy

## 2024-07-18 DIAGNOSIS — R252 Cramp and spasm: Secondary | ICD-10-CM | POA: Diagnosis present

## 2024-07-18 DIAGNOSIS — M6281 Muscle weakness (generalized): Secondary | ICD-10-CM | POA: Diagnosis present

## 2024-07-18 DIAGNOSIS — M5459 Other low back pain: Secondary | ICD-10-CM | POA: Diagnosis present

## 2024-07-18 DIAGNOSIS — M542 Cervicalgia: Secondary | ICD-10-CM | POA: Diagnosis present

## 2024-07-18 NOTE — Therapy (Signed)
 OUTPATIENT PHYSICAL THERAPY CERVICAL/ LUMBAR TREATMENT   Patient Name: Christy Holland MRN: [id number] DOB:[birth year], 53 y.o., female Today's Date: 07/18/2024  END OF SESSION:  PT End of Session - 07/18/24 1119     Visit Number 2    Date for Recertification  09/13/24    Authorization Type [employer] / [employer]    Authorization Time Period 9/23-11/21/25    Authorization - Visit Number 1    Authorization - Number of Visits 8    PT Start Time 1104    PT Stop Time 1142    PT Time Calculation (min) 38 min    Behavior During Therapy [facility] for tasks assessed/performed          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/2017   Rhinitis, chronic 08/21/2017   Sinusitis 02/13/2018   Snoring 08/21/2017   Past Surgical History:  Procedure Laterality Date   PVC ABLATION N/A 06/18/2021   Procedure: PVC ABLATION;  Surgeon: Waddell Danelle ORN, MD;  Location: [facility] INVASIVE [facility];  Service: Cardiovascular;  Laterality: N/A;   Patient Active Problem List   Diagnosis Date Noted   Menorrhagia 05/01/2024   Osteoarthritis of right knee 01/26/2024   Bilateral carpal tunnel syndrome 12/27/2023   Arthralgia of right knee 11/22/2023   Low back pain 11/14/2023   Neck pain 11/14/2023   Episodic recurrent vertigo 07/31/2023   Balance disorder 07/17/2023   Dizziness 07/17/2023   Carpal tunnel syndrome on both sides 04/04/2022   Chronic right shoulder pain 04/04/2022   Cervical radiculopathy 04/04/2022   Class 2 severe obesity due to excess calories with serious comorbidity and body mass index (BMI) of 36.0 to 36.9 in adult 03/17/2022   History of cardiac radiofrequency ablation 01/18/2022   Insomnia 11/12/2021   Mass of left breast 10/19/2021   Acne vulgaris 09/28/2021   Dyspnea on exertion 04/03/2021   Holter monitor, abnormal 08/22/2019   Frequent PVCs  03/01/2019   Sinusitis 02/13/2018   Chronic maxillary sinusitis 08/21/2017   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/21/2017   Rhinitis, chronic 08/21/2017   OSA (obstructive sleep apnea) 08/21/2017    PCP: Leonce Carola PARAS, PA-C   REFERRING PROVIDER: Faye Lauraine PARAS, FNP  REFERRING DIAG:  Diagnosis  M54.12 (ICD-10-CM) - Radiculopathy, cervical region    THERAPY DIAG:  Cervicalgia  Muscle weakness (generalized)  Other low back pain  Cramp and spasm  Rationale for Evaluation and Treatment: Rehabilitation  ONSET DATE: Chronic   SUBJECTIVE:  SUBJECTIVE STATEMENT: Pt reports no changes from evaluation.  She reports she doesn't know how to swim, but is not afraid of the water.    From initial evaluation:  Patient presents with chronic neck pain that hurts constantly. She has done injections and nerve blocks in the past. She reports having DDD and compressed nerves. It is more challenging looking to the right than the left. She reports numbness and tingling down her arms to all of her fingers. She reports differences in her grip strength and she drops objects. Pain impacts her sleep at night. She has daily headaches that last multiple days when she is having a bad flare up.She also reports chronic back pain that interferes with her standing and sitting tolerance. She has numbness and tingling in both of her legs that goes all the way down to her feet.  Hand dominance: Right  PERTINENT HISTORY:  Hx back neck and right shoulder pain; OA of right knee  PAIN:  Are you having pain? Yes: NPRS scale: 8/ 10 Pain location: bilateral cervical spine starting at base of head; lower back at beltline Pain description: achy; sharp Aggravating factors: cervical rotation (Rt > Lt); driving;  standing> 5 mins; sitting > 5-10 mins; walking> 5-42mins Relieving factors: Ice/ Heat; stretching  PRECAUTIONS: None  RED FLAGS: None     WEIGHT BEARING RESTRICTIONS: No  FALLS:  Has patient fallen in last 6 months? 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  DIAGNOSTIC FINDINGS:  Cervical Spine MR 12/29/2013 IMPRESSION:  1. Cervical spondylosis and degenerative disc disease, causing  moderate impingement at C5-6 and C6-7 as detailed above.   PATIENT SURVEYS:  NDI: 34/50 68%  Minimum Detectable Change (90% confidence): 5 points or 10% points  ODI: 35/50 70%  COGNITION: Overall cognitive status: Within functional limits for tasks assessed  SENSATION: Numbness and tingling in UE and LEs 0/4 on light touch sensation bilateral but able to feel deeper pressure sensation  POSTURE: rounded shoulders and forward head   CERVICAL ROM: * pain  Active ROM A/PROM (deg) eval  Flexion 22*  Extension 25*  Right lateral flexion 20*  Left lateral flexion 12*  Right rotation 30*  Left rotatio/n 30*   (Blank row/s = not tested)    UPPER  EXTREMITY MMT: Grossly 4-/5 pain with shoulder abduction & flexion testing Grip strength to be tested    LOWER  EXTREMITY FFU:Hmnddob 4- to 4 /5 bilateral  (very weak hip flexors, abductors)   FUNCTIONAL TESTS:  5 times sit to stand: 23.40sec hands on knees  TREATMENT DATE:  [facility]  Adult PT Treatment:                                             Date: 07/18/24 Pt seen for aquatic therapy today.  Treatment took place in water 3.5-4.75 ft in depth at the [facility] pool. Temp of  water was 91.  Pt entered/exited the pool via stairs independently with bil rail.  - Intro to aquatic therapy principles - unsupported walking forward/ backward - side stepping with arm add/abdct -> with horz abdct/add with rainbow hand floats on surface - shoulder circles -> scap depression with squeeze -> with chin tuck - open book in staggered stance with hip against wall x 5 each side - marching with alternating row  - straddling noodle with UE support on wall: cycling; hip abdct/ add ; hip flexion/ extension  Pt requires the buoyancy and hydrostatic pressure of water for support, and to offload joints by unweighting joint load by at least 50 % in navel deep water and by at least 75-80% in chest to neck deep water.  Viscosity of the water is needed for resistance of strengthening. Water current perturbations provides challenge to standing balance requiring increased core activation.     PATIENT EDUCATION:  Education details: intro to aquatic therapy  Person educated: Patient Education method: Explanation, Demonstration Education comprehension: verbalized understanding, returned demonstration, and needs further education  HOME EXERCISE PROGRAM: Access Code: [email or web address] URL: [email or web address] Date: 07/16/2024 Prepared by: Kristeen Sar  Exercises - Seated Scapular Retraction  - 1 x daily - 7 x weekly - 1 sets - 10 reps - Seated Cervical Retraction  - 1 x daily - 7 x weekly - 1 sets - 10 reps - Sidelying Thoracic Rotation with Open Book  - 1 x daily - 7 x weekly - 1 sets - 10 reps  ASSESSMENT:  CLINICAL IMPRESSION: Pt demonstrates safety and independence in aquatic setting with therapist instructing from deck. She is confident in setting, moving throughout all  depths easily, but requires UE support when in deepest part of therapy pool. Pt reported 1 point reduction with aquatic exercise and 3 point reduction with suspended cycling on noodle at end of session. Pt was instructed on what to expect post-session. Will plan to progress as tolerated and issue list of area pools at next visit.  Goals are ongoing.     From initial evaluation:  Patient is a 53 y.o. female who was seen today for physical therapy evaluation and treatment for neck and back pain. [place] presents with chronic neck and back pain there interferes with her functional mobility and ability to perform work duties. Her cervical rotation is limited and painful and affects her ability to look in her blind spots. She reports having daily headaches that sometimes last multiple days if she is having a bad flare up. She reports numbness and tingling in her upper and lower extremities that also affects her grip strength. Based on evaluation noted poor posture, muscle weakness, and decreased ROM. When testing light touch sensation patient did not feel any points on her thigh and lower leg bilaterally . She felt deep pressure sensation. Educated patient to return to her MD to get a nerve conduction study to see if she has neuropathy. She said she was going to get tested for it in the past, but never got it done. She understood the safety aspect involved of not having full sensation in her lower extremities. Patient will benefit from skilled PT to address the below impairments and improve overall function.   OBJECTIVE IMPAIRMENTS: decreased endurance, decreased mobility, difficulty walking, decreased ROM, decreased strength, hypomobility, increased fascial restrictions, increased muscle spasms, impaired flexibility, postural dysfunction, and pain.   ACTIVITY LIMITATIONS: carrying, lifting, bending, sitting, standing, squatting, sleeping, stairs, transfers, dressing, reach over head, and  hygiene/grooming  PARTICIPATION LIMITATIONS: meal prep, cleaning, laundry, driving, shopping, community activity, and occupation  PERSONAL FACTORS: Age, Fitness, Time since onset of injury/illness/exacerbation, and 1-2 comorbidities: OA are also affecting patient's functional outcome.   REHAB POTENTIAL: Good  CLINICAL DECISION MAKING: Evolving/moderate complexity  EVALUATION COMPLEXITY: Moderate   GOALS: Goals reviewed with patient? Yes  SHORT TERM GOALS: Target date: 08/13/2024  Patient will be independent with initial HEP. Baseline:  Goal status: INITIAL  2.  Patient will report > or = to 30% improvement in sleep quality since starting PT. Baseline: disturbed for 3-4 hours Goal status: INITIAL  3.  Patient will demonstrate correct seated and standing posture with no verbal cues to decrease mechanical strain. Baseline:  Goal status: INITIAL  4.  Patient will be able to stand for at least 10 mins with < or = to 5/10 back pain for performance of ADLs and house chores.  Baseline: 5 mins Goal status: INITIAL    LONG TERM GOALS: Target date: 09/13/2024  Patient will demonstrate independence in advanced HEP. Baseline:  Goal status: INITIAL  2.  Patient will report > or = to 60% improvement in neck and back pain since starting PT. Baseline:  Goal status: INITIAL  3.  Patient will complete 5STS in < or= to 19 sec due to improved muscle strength and to be closer to age predicted norms. Baseline: 23.40 sec Goal status: INITIAL   4.  Patient to score < or = to 29/50 on NDI due to improved function and decreased disability.  Baseline: 34/50  Goal status: INITIAL  5.  Patient to score < or = to 25 on ODI due to decreased self perceived disability.  Baseline: 35/50 Goal status: INITIAL  6.  Patient will demonstrate improved cervical ROM by 5-8 degrees to be able to look in her blind spot while driving. Baseline:  Goal status: INITIAL   PLAN:  PT FREQUENCY:  2x/week  PT DURATION: 8 weeks  PLANNED INTERVENTIONS: 97164- PT Re-evaluation, 97110-Therapeutic exercises, 97530- Therapeutic activity, 97112- Neuromuscular re-education, 97535- Self Care, [id number]- Manual therapy, [phone]- Gait training, [phone]- Canalith repositioning, [phone]- Aquatic Therapy, [phone]- Electrical stimulation (unattended), [phone]- Electrical stimulation (manual), [id number]- Vasopneumatic device, [id number]- Ultrasound, [id number]- Traction (mechanical), [id number]- Ionotophoresis 4mg /ml Dexamethasone, [id number] (1-2 muscles), [id number] (3+ muscles)- Dry Needling, Patient/Family education, Balance training, Stair training, Taping, Joint mobilization, Joint manipulation, Spinal manipulation, Spinal mobilization, Vestibular training, Cryotherapy, and Moist heat  PLAN FOR NEXT SESSION: Review HEP; initiate core exercises; cervical melt method; possibly cervical traction; assess grip strength; nerve glides   Delon Aquas, PTA 07/18/24 12:42 PM [facility] [address] [place], [place], [address] Phone: [phone]   Fax:  [phone]

## 2024-07-23 ENCOUNTER — Ambulatory Visit (HOSPITAL_BASED_OUTPATIENT_CLINIC_OR_DEPARTMENT_OTHER): Attending: Family Medicine | Admitting: Physical Therapy

## 2024-07-23 ENCOUNTER — Encounter (HOSPITAL_BASED_OUTPATIENT_CLINIC_OR_DEPARTMENT_OTHER): Payer: Self-pay | Admitting: Physical Therapy

## 2024-07-23 DIAGNOSIS — M6281 Muscle weakness (generalized): Secondary | ICD-10-CM | POA: Diagnosis present

## 2024-07-23 DIAGNOSIS — M5459 Other low back pain: Secondary | ICD-10-CM | POA: Diagnosis present

## 2024-07-23 DIAGNOSIS — M542 Cervicalgia: Secondary | ICD-10-CM | POA: Insufficient documentation

## 2024-07-23 NOTE — Therapy (Signed)
 OUTPATIENT PHYSICAL THERAPY CERVICAL/ LUMBAR TREATMENT   Patient Name: Christy Holland MRN: [id number] DOB:1971-07-25, 53 y.o., female Today's Date: 07/23/2024  END OF SESSION:  PT End of Session - 07/23/24 1409     Visit Number 3    Date for Recertification  09/13/24    Authorization Type [employer] / [employer]    Authorization Time Period 9/23-11/21/25    Authorization - Visit Number 2    Authorization - Number of Visits 8    PT Start Time 1400    PT Stop Time 1440    PT Time Calculation (min) 40 min    Activity Tolerance Patient tolerated treatment well    Behavior During Therapy [facility] for tasks assessed/performed          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/2017   Rhinitis, chronic 08/21/2017   Sinusitis 02/13/2018   Snoring 08/21/2017   Past Surgical History:  Procedure Laterality Date   PVC ABLATION N/A 06/18/2021   Procedure: PVC ABLATION;  Surgeon: Waddell Danelle ORN, MD;  Location: [facility];  Service: Cardiovascular;  Laterality: N/A;   Patient Active Problem List   Diagnosis Date Noted   Menorrhagia 05/01/2024   Osteoarthritis of right knee 01/26/2024   Bilateral carpal tunnel syndrome 12/27/2023   Arthralgia of right knee 11/22/2023   Low back pain 11/14/2023   Neck pain 11/14/2023   Episodic recurrent vertigo 07/31/2023   Balance disorder 07/17/2023   Dizziness 07/17/2023   Carpal tunnel syndrome on both sides 04/04/2022   Chronic right shoulder pain 04/04/2022   Cervical radiculopathy 04/04/2022   Class 2 severe obesity due to excess calories with serious comorbidity and body mass index (BMI) of 36.0 to 36.9 in adult 03/17/2022   History of cardiac radiofrequency ablation 01/18/2022   Insomnia 11/12/2021   Mass of left breast 10/19/2021   Acne vulgaris 09/28/2021   Dyspnea on exertion 04/03/2021    Holter monitor, abnormal 08/22/2019   Frequent PVCs 03/01/2019   Sinusitis 02/13/2018   Chronic maxillary sinusitis 08/21/2017   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/21/2017   Rhinitis, chronic 08/21/2017   OSA (obstructive sleep apnea) 08/21/2017    PCP: Leonce Carola PARAS, PA-C   REFERRING PROVIDER: Faye Lauraine PARAS, FNP  REFERRING DIAG:  Diagnosis  M54.12 (ICD-10-CM) - Radiculopathy, cervical region    THERAPY DIAG:  Cervicalgia  Muscle weakness (generalized)  Other low back pain  Rationale for Evaluation and Treatment: Rehabilitation  ONSET DATE: Chronic   SUBJECTIVE:  SUBJECTIVE STATEMENT: Pt reports called [employer] to check temps and all of them were about 85-97 degrees.  She reports she was a little sore after first session.    From initial evaluation:  Patient presents with chronic neck pain that hurts constantly. She has done injections and nerve blocks in the past. She reports having DDD and compressed nerves. It is more challenging looking to the right than the left. She reports numbness and tingling down her arms to all of her fingers. She reports differences in her grip strength and she drops objects. Pain impacts her sleep at night. She has daily headaches that last multiple days when she is having a bad flare up.She also reports chronic back pain that interferes with her standing and sitting tolerance. She has numbness and tingling in both of her legs that goes all the way down to her feet.  Hand dominance: Right  PERTINENT HISTORY:  Hx back neck and right shoulder pain; OA of right knee  PAIN:  Are you having pain? Yes: NPRS scale: 7/ 10 Pain location: bilateral cervical spine starting at base of head; lower back at beltline Pain description: achy;  sharp Aggravating factors: cervical rotation (Rt > Lt); driving; standing> 5 mins; sitting > 5-10 mins; walking> 5-38mins Relieving factors: Ice/ Heat; stretching  PRECAUTIONS: None  RED FLAGS: None     WEIGHT BEARING RESTRICTIONS: No  FALLS:  Has patient fallen in last 6 months? 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  DIAGNOSTIC FINDINGS:  Cervical Spine MR 12/29/2013 IMPRESSION:  1. Cervical spondylosis and degenerative disc disease, causing  moderate impingement at C5-6 and C6-7 as detailed above.   PATIENT SURVEYS:  NDI: 34/50 68%  Minimum Detectable Change (90% confidence): 5 points or 10% points  ODI: 35/50 70%  COGNITION: Overall cognitive status: Within functional limits for tasks assessed  SENSATION: Numbness and tingling in UE and LEs 0/4 on light touch sensation bilateral but able to feel deeper pressure sensation  POSTURE: rounded shoulders and forward head   CERVICAL ROM: * pain  Active ROM A/PROM (deg) eval  Flexion 22*  Extension 25*  Right lateral flexion 20*  Left lateral flexion 12*  Right rotation 30*  Left rotatio/n 30*   (Blank row/s = not tested)    UPPER  EXTREMITY MMT: Grossly 4-/5 pain with shoulder abduction & flexion testing Grip strength to be tested    LOWER  EXTREMITY FFU:Hmnddob 4- to 4 /5 bilateral  (very weak hip flexors, abductors)   FUNCTIONAL TESTS:  5 times  sit to stand: 23.40sec hands on knees  TREATMENT DATE:  [facility] Adult PT Treatment:                                             Date: 07/23/24 Pt seen for aquatic therapy today.  Treatment took place in water 3.5-4.75 ft in depth at the [facility]. Temp of water was 91.  Pt entered/exited the pool via stairs independently with bil rail.   - unsupported walking forward/ backward - side stepping with arm add/abdct  - straddling noodle with doggy paddle UE: cycling; with UE support on wall: hip abdct/ add ; hip flexion/ extension - side stepping with arm add/abdct with short hollow noodles  - staggered stance with horiz abdct/ add with short hollow noodles - queen nods - gentle - forward march with alternating row with short hollow noodles - suitcase carry with  bil/ single rainbow hand float at side while marching forward/ backward  - STS from bench in water with feet on blue step with cues for neutral head and forward arm reach x 5 - STS from 3rd step in water, x 5    Pt requires the buoyancy and hydrostatic pressure of water for support, and to offload joints by unweighting joint load by at least 50 % in navel deep water and by at least 75-80% in chest to neck deep water.  Viscosity of the water is needed for resistance of strengthening. Water current perturbations provides challenge to standing balance requiring increased core activation.     PATIENT EDUCATION:  Education details: intro to aquatic therapy  Person educated: Patient Education method: Explanation, Demonstration Education comprehension: verbalized understanding, returned demonstration, and needs further education  HOME EXERCISE PROGRAM: Access Code: [email or web address] URL: [email or web address] Date: 07/16/2024 Prepared by: Kristeen Sar  Exercises - Seated Scapular Retraction  - 1 x daily - 7 x weekly - 1 sets - 10 reps - Seated Cervical Retraction  - 1 x daily - 7 x weekly - 1 sets - 10 reps -  Sidelying Thoracic Rotation with Open Book  - 1 x daily - 7 x weekly - 1 sets - 10 reps  ASSESSMENT:  CLINICAL IMPRESSION: Pt able to maintain balance on noodle with suspended cycling.  Pt reported reduction of pain by 1 point.  Will continue to progress as tolerated.  Plan to trial lap pool next session (for checking tolerance to 85deg temp) Goals are ongoing.     From initial evaluation:  Patient is a 53 y.o. female who was seen today for physical therapy evaluation and treatment for neck and back pain. Christy Holland presents with chronic neck and back pain there interferes with her functional mobility and ability to perform work duties. Her cervical rotation is limited and painful and affects her ability to look in her blind spots. She reports having daily headaches that sometimes last multiple days if she is having a bad flare up. She reports numbness and tingling in her upper and lower extremities that also affects her grip strength. Based on evaluation noted poor posture, muscle weakness, and decreased ROM. When testing light touch sensation patient did not feel any points on her thigh and lower leg bilaterally . She felt deep pressure sensation. Educated patient to return to her MD to get a nerve conduction study to see if she has neuropathy. She said she was going to get tested for it in the past, but never got it done. She understood the safety aspect involved of not having full sensation in her lower extremities. Patient will benefit from skilled PT to address the below impairments and improve overall function.   OBJECTIVE IMPAIRMENTS: decreased endurance, decreased mobility, difficulty walking, decreased ROM, decreased strength, hypomobility, increased fascial restrictions, increased muscle spasms, impaired flexibility, postural dysfunction, and pain.   ACTIVITY LIMITATIONS: carrying, lifting, bending, sitting, standing, squatting, sleeping,  stairs, transfers, dressing, reach over head, and  hygiene/grooming  PARTICIPATION LIMITATIONS: meal prep, cleaning, laundry, driving, shopping, community activity, and occupation  PERSONAL FACTORS: Age, Fitness, Time since onset of injury/illness/exacerbation, and 1-2 comorbidities: OA are also affecting patient's functional outcome.   REHAB POTENTIAL: Good  CLINICAL DECISION MAKING: Evolving/moderate complexity  EVALUATION COMPLEXITY: Moderate   GOALS: Goals reviewed with patient? Yes  SHORT TERM GOALS: Target date: 08/13/2024  Patient will be independent with initial HEP. Baseline:  Goal status: INITIAL  2.  Patient will report > or = to 30% improvement in sleep quality since starting PT. Baseline: disturbed for 3-4 hours Goal status: INITIAL  3.  Patient will demonstrate correct seated and standing posture with no verbal cues to decrease mechanical strain. Baseline:  Goal status: INITIAL  4.  Patient will be able to stand for at least 10 mins with < or = to 5/10 back pain for performance of ADLs and house chores.  Baseline: 5 mins Goal status: INITIAL    LONG TERM GOALS: Target date: 09/13/2024  Patient will demonstrate independence in advanced HEP. Baseline:  Goal status: INITIAL  2.  Patient will report > or = to 60% improvement in neck and back pain since starting PT. Baseline:  Goal status: INITIAL  3.  Patient will complete 5STS in < or= to 19 sec due to improved muscle strength and to be closer to age predicted norms. Baseline: 23.40 sec Goal status: INITIAL   4.  Patient to score < or = to 29/50 on NDI due to improved function and decreased disability.  Baseline: 34/50  Goal status: INITIAL  5.  Patient to score < or = to 25 on ODI due to decreased self perceived disability.  Baseline: 35/50 Goal status: INITIAL  6.  Patient will demonstrate improved cervical ROM by 5-8 degrees to be able to look in her blind spot while driving. Baseline:  Goal status: INITIAL   PLAN:  PT FREQUENCY:  2x/week  PT DURATION: 8 weeks  PLANNED INTERVENTIONS: [id number]- PT Re-evaluation, 97110-Therapeutic exercises, 97530- Therapeutic activity, 97112- Neuromuscular re-education, 97535- Self Care, 02859- Manual therapy, [id number]- Gait training, [phone]- Canalith repositioning, [phone]- Aquatic Therapy, [phone]- Electrical stimulation (unattended), [phone]- Electrical stimulation (manual), [id number]- Vasopneumatic device, [id number]- Ultrasound, [id number]- Traction (mechanical), [id number]- Ionotophoresis 4mg /ml Dexamethasone, 79439 (1-2 muscles), [id number] (3+ muscles)- Dry Needling, Patient/Family education, Balance training, Stair training, Taping, Joint mobilization, Joint manipulation, Spinal manipulation, Spinal mobilization, Vestibular training, Cryotherapy, and Moist heat  PLAN FOR NEXT SESSION: Review HEP; initiate core exercises; cervical melt method; possibly cervical traction; assess grip strength; nerve glides  Delon Aquas, PTA 07/23/24 6:06 PM [facility] [address] [place], [place], [address] Phone: [phone]   Fax:  [phone]

## 2024-07-25 ENCOUNTER — Ambulatory Visit: Attending: Family Medicine | Admitting: Physical Therapy

## 2024-07-25 ENCOUNTER — Encounter: Payer: Self-pay | Admitting: Physical Therapy

## 2024-07-25 DIAGNOSIS — M6281 Muscle weakness (generalized): Secondary | ICD-10-CM | POA: Insufficient documentation

## 2024-07-25 DIAGNOSIS — R252 Cramp and spasm: Secondary | ICD-10-CM | POA: Insufficient documentation

## 2024-07-25 DIAGNOSIS — M542 Cervicalgia: Secondary | ICD-10-CM | POA: Diagnosis present

## 2024-07-25 DIAGNOSIS — M5459 Other low back pain: Secondary | ICD-10-CM | POA: Insufficient documentation

## 2024-07-25 NOTE — Therapy (Signed)
 OUTPATIENT PHYSICAL THERAPY CERVICAL/ LUMBAR TREATMENT   Patient Name: Christy Holland MRN: [id number] DOB:[birth year], 53 y.o., female Today's Date: 07/25/2024  END OF SESSION:  PT End of Session - 07/25/24 1419     Visit Number 4    Date for Recertification  09/13/24    Authorization Type [employer] / [employer] Approved 8 visits- 07/16/24-09/13/24-auth# [id number]    Authorization Time Period 9/23-11/21/25    Authorization - Visit Number 3    Authorization - Number of Visits 8    PT Start Time 1235    PT Stop Time 1322    PT Time Calculation (min) 47 min    Activity Tolerance Patient tolerated treatment well    Behavior During Therapy [facility] for tasks assessed/performed           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/2017   Rhinitis, chronic 08/21/2017   Sinusitis 02/13/2018   Snoring 08/21/2017   Past Surgical History:  Procedure Laterality Date   PVC ABLATION N/A 06/18/2021   Procedure: PVC ABLATION;  Surgeon: Waddell Danelle ORN, MD;  Location: [facility];  Service: Cardiovascular;  Laterality: N/A;   Patient Active Problem List   Diagnosis Date Noted   Menorrhagia 05/01/2024   Osteoarthritis of right knee 01/26/2024   Bilateral carpal tunnel syndrome 12/27/2023   Arthralgia of right knee 11/22/2023   Low back pain 11/14/2023   Neck pain 11/14/2023   Episodic recurrent vertigo 07/31/2023   Balance disorder 07/17/2023   Dizziness 07/17/2023   Carpal tunnel syndrome on both sides 04/04/2022   Chronic right shoulder pain 04/04/2022   Cervical radiculopathy 04/04/2022   Class 2 severe obesity due to excess calories with serious comorbidity and body mass index (BMI) of 36.0 to 36.9 in adult 03/17/2022   History of cardiac radiofrequency ablation 01/18/2022   Insomnia 11/12/2021   Mass of left breast 10/19/2021    Acne vulgaris 09/28/2021   Dyspnea on exertion 04/03/2021   Holter monitor, abnormal 08/22/2019   Frequent PVCs 03/01/2019   Sinusitis 02/13/2018   Chronic maxillary sinusitis 08/21/2017   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/21/2017   Rhinitis, chronic 08/21/2017   OSA (obstructive sleep apnea) 08/21/2017    PCP: Leonce Carola PARAS, PA-C   REFERRING PROVIDER: Faye Lauraine PARAS, FNP  REFERRING DIAG:  Diagnosis  M54.12 (ICD-10-CM) - Radiculopathy, cervical region    THERAPY DIAG:  Cervicalgia  Muscle weakness (generalized)  Other low back pain  Cramp and spasm  Rationale for Evaluation and Treatment: Rehabilitation  ONSET DATE: Chronic   SUBJECTIVE:  SUBJECTIVE STATEMENT: Patient reports she is doing good today. Back pain is 8/10. She has been compliant with HEP exercises. She is enjoying the aquatic environment.    From initial evaluation:  Patient presents with chronic neck pain that hurts constantly. She has done injections and nerve blocks in the past. She reports having DDD and compressed nerves. It is more challenging looking to the right than the left. She reports numbness and tingling down her arms to all of her fingers. She reports differences in her grip strength and she drops objects. Pain impacts her sleep at night. She has daily headaches that last multiple days when she is having a bad flare up.She also reports chronic back pain that interferes with her standing and sitting tolerance. She has numbness and tingling in both of her legs that goes all the way down to her feet.  Hand dominance: Right  PERTINENT HISTORY:  Hx back neck and right shoulder pain; OA of right knee  PAIN:  Are you having pain? Yes: NPRS scale: 7/ 10 Pain location: bilateral cervical spine  starting at base of head; lower back at beltline Pain description: achy; sharp Aggravating factors: cervical rotation (Rt > Lt); driving; standing> 5 mins; sitting > 5-10 mins; walking> 5-72mins Relieving factors: Ice/ Heat; stretching  PRECAUTIONS: None  RED FLAGS: None     WEIGHT BEARING RESTRICTIONS: No  FALLS:  Has patient fallen in last 6 months? 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  DIAGNOSTIC FINDINGS:  Cervical Spine MR 12/29/2013 IMPRESSION:  1. Cervical spondylosis and degenerative disc disease, causing  moderate impingement at C5-6 and C6-7 as detailed above.   PATIENT SURVEYS:  NDI: 34/50 68%  Minimum Detectable Change (90% confidence): 5 points or 10% points  ODI: 35/50 70%  COGNITION: Overall cognitive status: Within functional limits for tasks assessed  SENSATION: Numbness and tingling in UE and LEs 0/4 on light touch sensation bilateral but able to feel deeper pressure sensation  POSTURE: rounded shoulders and forward head   CERVICAL ROM: * pain  Active ROM A/PROM (deg) eval  Flexion 22*  Extension 25*  Right lateral flexion 20*  Left lateral flexion 12*  Right rotation 30*  Left rotatio/n 30*   (Blank row/s = not tested)    UPPER  EXTREMITY MMT: Grossly 4-/5 pain with shoulder abduction & flexion testing Grip strength to be tested    LOWER  EXTREMITY FFU:Hmnddob 4- to 4 /5  bilateral  (very weak hip flexors, abductors)   FUNCTIONAL TESTS:  5 times sit to stand: 23.40sec hands on knees  TREATMENT DATE:  [facility] Adult PT Treatment:                                              07/25/2024 NuStep Level 5 5 mins- PT present to discuss status Seated scap retraction x 12 Seated cervical retraction x 12 3way  stability ball stretch x 8 each direction  Sidelying open books x 8 Hooklying TA activation with red stability ball x 15  Trigger Point Dry Needling  Initial Treatment: Pt instructed on Dry Needling rational, procedures, and possible side effects. Pt instructed to expect mild to moderate muscle soreness later in the day and/or into the next day.  Pt instructed in methods to reduce muscle soreness. Pt instructed to continue prescribed HEP. Patient was educated on signs and symptoms of infection and other risk factors and advised to seek medical attention should they occur.  Patient verbalized understanding of these instructions and education.   Patient Verbal Consent Given: Yes Education Handout Provided: Previously Provided Muscles Treated: bilateral lumbar multifidi; bilateral gluteals  Electrical Stimulation Performed: No Treatment Response/Outcome: Utilized skilled palpation to identify trigger points.  During dry needling able to palpate muscle twitch and muscle elongation    Single knee to chest x 30 sec bilateral  Supine glute stretch x 30 sec bilateral   Date: 07/23/24 Pt seen for aquatic therapy today.  Treatment took place in water 3.5-4.75 ft in depth at the [facility] pool. Temp of water was 91.  Pt entered/exited the pool via stairs independently with bil rail.   - unsupported walking forward/ backward - side stepping with arm add/abdct  - straddling noodle with doggy paddle UE: cycling; with UE support on wall: hip abdct/ add ; hip flexion/ extension - side stepping with arm add/abdct with short hollow noodles  - staggered stance  with horiz abdct/ add with short hollow noodles - queen nods - gentle - forward march with alternating row with short hollow noodles - suitcase carry with  bil/ single rainbow hand float at side while marching forward/ backward  - STS from bench in water with feet on blue step with cues for neutral head and forward arm reach x 5 - STS from 3rd step in water, x 5    Pt requires the buoyancy and hydrostatic pressure of water for support, and to offload joints by unweighting joint load by at least 50 % in navel deep water and by at least 75-80% in chest to neck deep water.  Viscosity of the water is needed for resistance of strengthening. Water current perturbations provides challenge to standing balance requiring increased core activation.     PATIENT EDUCATION:  Education details: intro to aquatic therapy  Person educated: Patient Education method: Explanation, Demonstration Education comprehension: verbalized understanding, returned demonstration, and needs further education  HOME EXERCISE PROGRAM: Access Code: [email or web address] URL: [email or web address] Date: 07/25/2024 Prepared by: Kristeen Sar  Exercises - Seated Scapular Retraction  - 1 x daily - 7 x weekly - 1 sets - 10 reps - Seated Cervical Retraction  - 1 x daily - 7 x weekly - 1 sets - 10 reps - Sidelying Thoracic Rotation with Open Book  - 1 x daily - 7 x weekly - 1 sets - 10 reps - Supine Single Knee to Chest Stretch  - 1 x daily - 7 x weekly - 2 sets - 20-30 hold - Supine Gluteus Stretch  - 1 x daily - 7 x weekly - 2 sets - 20-30 hold  ASSESSMENT:  CLINICAL IMPRESSION: Christy Holland presents to her first land follow up since evaluation. She verbalized 8/10 low back pain today that is bilateral. She has been complaint with HEP and required minimal verbal cues for form correction. Introduced core strengthening exercise today and patient required verbal and visual cues for correct exercise performance. Dry  needling was  successful at eliciting muscle twitch and elongation. Educated patient to stay hydrated and continue with HEP exercises.    From initial evaluation:  Patient is a 53 y.o. female who was seen today for physical therapy evaluation and treatment for neck and back pain. Christy Holland presents with chronic neck and back pain there interferes with her functional mobility and ability to perform work duties. Her cervical rotation is limited and painful and affects her ability to look in her blind spots. She reports having daily headaches that sometimes last multiple days if she is having a bad flare up. She reports numbness and tingling in her upper and lower extremities that also affects her grip strength. Based on evaluation noted poor posture, muscle weakness, and decreased ROM. When testing light touch sensation patient did not feel any points on her thigh and lower leg bilaterally . She felt deep pressure sensation. Educated patient to return to her MD to get a nerve conduction study to see if she has neuropathy. She said she was going to get tested for it in the past, but never got it done. She understood the safety aspect involved of not having full sensation in her lower extremities. Patient will benefit from skilled PT to address the below impairments and improve overall function.   OBJECTIVE IMPAIRMENTS: decreased endurance, decreased mobility, difficulty walking, decreased ROM, decreased strength, hypomobility, increased fascial restrictions, increased muscle spasms, impaired flexibility, postural dysfunction, and pain.   ACTIVITY LIMITATIONS: carrying, lifting, bending, sitting, standing, squatting, sleeping, stairs, transfers, dressing, reach over head, and hygiene/grooming  PARTICIPATION LIMITATIONS: meal prep, cleaning, laundry, driving, shopping, community activity, and occupation  PERSONAL FACTORS: Age, Fitness, Time since onset of injury/illness/exacerbation, and 1-2 comorbidities: OA are also  affecting patient's functional outcome.   REHAB POTENTIAL: Good  CLINICAL DECISION MAKING: Evolving/moderate complexity  EVALUATION COMPLEXITY: Moderate   GOALS: Goals reviewed with patient? Yes  SHORT TERM GOALS: Target date: 08/13/2024  Patient will be independent with initial HEP. Baseline:  Goal status: INITIAL  2.  Patient will report > or = to 30% improvement in sleep quality since starting PT. Baseline: disturbed for 3-4 hours Goal status: INITIAL  3.  Patient will demonstrate correct seated and standing posture with no verbal cues to decrease mechanical strain. Baseline:  Goal status: INITIAL  4.  Patient will be able to stand for at least 10 mins with < or = to 5/10 back pain for performance of ADLs and house chores.  Baseline: 5 mins Goal status: INITIAL    LONG TERM GOALS: Target date: 09/13/2024  Patient will demonstrate independence in advanced HEP. Baseline:  Goal status: INITIAL  2.  Patient will report > or = to 60% improvement in neck and back pain since starting PT. Baseline:  Goal status: INITIAL  3.  Patient will complete 5STS in < or= to 19 sec due to improved muscle strength and to be closer to age predicted norms. Baseline: 23.40 sec Goal status: INITIAL   4.  Patient to score < or = to 29/50 on NDI due to improved function and decreased disability.  Baseline: 34/50  Goal status: INITIAL  5.  Patient to score < or = to 25 on ODI due to decreased self perceived disability.  Baseline: 35/50 Goal status: INITIAL  6.  Patient will demonstrate improved cervical ROM by 5-8 degrees to be able to look in her blind spot while driving. Baseline:  Goal status: INITIAL   PLAN:  PT FREQUENCY: 2x/week  PT  DURATION: 8 weeks  PLANNED INTERVENTIONS: [id number]- PT Re-evaluation, 97110-Therapeutic exercises, 97530- Therapeutic activity, [id number]- Neuromuscular re-education, 97535- Self Care, [id number]- Manual therapy, [phone]- Gait training, [phone]- Canalith  repositioning, [phone]- Aquatic Therapy, [phone]- Electrical stimulation (unattended), [phone]- Electrical stimulation (manual), [id number]- Vasopneumatic device, [id number]- Ultrasound, [id number]- Traction (mechanical), [id number]- Ionotophoresis 4mg /ml Dexamethasone, 20560 (1-2 muscles), 20561 (3+ muscles)- Dry Needling, Patient/Family education, Balance training, Stair training, Taping, Joint mobilization, Joint manipulation, Spinal manipulation, Spinal mobilization, Vestibular training, Cryotherapy, and Moist heat  PLAN FOR NEXT SESSION: assess response to DN #1 initiate core exercises; cervical melt method; possibly cervical traction; assess grip strength; nerve glides  Kristeen Sar, PT 07/25/24 2:22 PM

## 2024-07-31 ENCOUNTER — Telehealth: Payer: Self-pay

## 2024-07-31 ENCOUNTER — Encounter: Admitting: Physical Therapy

## 2024-07-31 NOTE — Telephone Encounter (Signed)
 Pt is scheduled to see Dr Neysa tomorrow for a f/u. I could not find pt in [facility]. I called Brad from [facility] and he also could not pull pt. We attempted to pull pt in [facility] and she also did not pull up. ATC pt X1. LMTCB

## 2024-08-01 ENCOUNTER — Ambulatory Visit: Admitting: Internal Medicine

## 2024-08-02 ENCOUNTER — Encounter: Payer: Self-pay | Admitting: Physical Therapy

## 2024-08-02 ENCOUNTER — Ambulatory Visit: Admitting: Physical Therapy

## 2024-08-02 DIAGNOSIS — M542 Cervicalgia: Secondary | ICD-10-CM | POA: Diagnosis not present

## 2024-08-02 DIAGNOSIS — R252 Cramp and spasm: Secondary | ICD-10-CM

## 2024-08-02 DIAGNOSIS — M5459 Other low back pain: Secondary | ICD-10-CM

## 2024-08-02 DIAGNOSIS — M6281 Muscle weakness (generalized): Secondary | ICD-10-CM

## 2024-08-02 NOTE — Therapy (Signed)
 OUTPATIENT PHYSICAL THERAPY CERVICAL/ LUMBAR TREATMENT   Patient Name: Christy Holland MRN: [id number] DOB:04/25/1971, 53 y.o., female Today's Date: 08/02/2024  END OF SESSION:  PT End of Session - 08/02/24 1436     Visit Number 5    Date for Recertification  09/13/24    Authorization Type [employer] / [employer] Approved 8 visits- 07/16/24-09/13/24-auth# [id number]    Authorization Time Period 9/23-11/21/25    Authorization - Visit Number 4    Authorization - Number of Visits 8    PT Start Time 1435    PT Stop Time 1515    PT Time Calculation (min) 40 min    Activity Tolerance Patient tolerated treatment well    Behavior During Therapy [facility] for tasks assessed/performed            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/2017   Rhinitis, chronic 08/21/2017   Sinusitis 02/13/2018   Snoring 08/21/2017   Past Surgical History:  Procedure Laterality Date   PVC ABLATION N/A 06/18/2021   Procedure: PVC ABLATION;  Surgeon: Waddell Danelle ORN, MD;  Location: [facility];  Service: Cardiovascular;  Laterality: N/A;   Patient Active Problem List   Diagnosis Date Noted   Menorrhagia 05/01/2024   Osteoarthritis of right knee 01/26/2024   Bilateral carpal tunnel syndrome 12/27/2023   Arthralgia of right knee 11/22/2023   Low back pain 11/14/2023   Neck pain 11/14/2023   Episodic recurrent vertigo 07/31/2023   Balance disorder 07/17/2023   Dizziness 07/17/2023   Carpal tunnel syndrome on both sides 04/04/2022   Chronic right shoulder pain 04/04/2022   Cervical radiculopathy 04/04/2022   Class 2 severe obesity due to excess calories with serious comorbidity and body mass index (BMI) of 36.0 to 36.9 in adult 03/17/2022   History of cardiac radiofrequency ablation 01/18/2022   Insomnia 11/12/2021   Mass of left breast 10/19/2021    Acne vulgaris 09/28/2021   Dyspnea on exertion 04/03/2021   Holter monitor, abnormal 08/22/2019   Frequent PVCs 03/01/2019   Sinusitis 02/13/2018   Chronic maxillary sinusitis 08/21/2017   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/21/2017   Rhinitis, chronic 08/21/2017   OSA (obstructive sleep apnea) 08/21/2017    PCP: Leonce Carola PARAS, PA-C   REFERRING PROVIDER: Faye Lauraine PARAS, FNP  REFERRING DIAG:  Diagnosis  M54.12 (ICD-10-CM) - Radiculopathy, cervical region    THERAPY DIAG:  Cervicalgia  Muscle weakness (generalized)  Other low back pain  Cramp and spasm  Rationale for Evaluation and Treatment: Rehabilitation  ONSET DATE: Chronic   SUBJECTIVE:  SUBJECTIVE STATEMENT: No new complaints today.   From initial evaluation:  Patient presents with chronic neck pain that hurts constantly. She has done injections and nerve blocks in the past. She reports having DDD and compressed nerves. It is more challenging looking to the right than the left. She reports numbness and tingling down her arms to all of her fingers. She reports differences in her grip strength and she drops objects. Pain impacts her sleep at night. She has daily headaches that last multiple days when she is having a bad flare up.She also reports chronic back pain that interferes with her standing and sitting tolerance. She has numbness and tingling in both of her legs that goes all the way down to her feet.  Hand dominance: Right  PERTINENT HISTORY:  Hx back neck and right shoulder pain; OA of right knee  PAIN:  Are you having pain? Yes: NPRS scale: 7/ 10 Pain location: bilateral cervical spine starting at base of head; lower back at beltline Pain description: achy; sharp Aggravating factors: cervical rotation  (Rt > Lt); driving; standing> 5 mins; sitting > 5-10 mins; walking> 5-47mins Relieving factors: Ice/ Heat; stretching  PRECAUTIONS: None  RED FLAGS: None     WEIGHT BEARING RESTRICTIONS: No  FALLS:  Has patient fallen in last 6 months? 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  DIAGNOSTIC FINDINGS:  Cervical Spine MR 12/29/2013 IMPRESSION:  1. Cervical spondylosis and degenerative disc disease, causing  moderate impingement at C5-6 and C6-7 as detailed above.   PATIENT SURVEYS:  NDI: 34/50 68%  Minimum Detectable Change (90% confidence): 5 points or 10% points  ODI: 35/50 70%  COGNITION: Overall cognitive status: Within functional limits for tasks assessed  SENSATION: Numbness and tingling in UE and LEs 0/4 on light touch sensation bilateral but able to feel deeper pressure sensation  POSTURE: rounded shoulders and forward head   CERVICAL ROM: * pain  Active ROM A/PROM (deg) eval  Flexion 22*  Extension 25*  Right lateral flexion 20*  Left lateral flexion 12*  Right rotation 30*  Left rotatio/n 30*   (Blank row/s = not tested)    UPPER  EXTREMITY MMT: Grossly 4-/5 pain with shoulder abduction & flexion testing Grip strength to be tested    LOWER  EXTREMITY FFU:Hmnddob 4- to 4 /5 bilateral  (very weak hip flexors, abductors)   FUNCTIONAL TESTS:  5 times sit to stand: 23.40sec hands on  knees  TREATMENT DATE:  [facility] Adult PT Treatment:                                               08/02/24:Pt seen for aquatic therapy today.  Treatment took place in water 3.5-4.75 ft in depth at the [facility] pool. Temp of water was 91.  Pt entered/exited the pool via stairs independently with bil rail. -75% water depth water walking:  forward/back 6 lengths with running arms, sidestepping with short hollow noodle shld abd/add 6 lengths -High knee march 4 lengths holding rainbow floats by her side. - 1 yellow float by the right side walk 4 length, then switch hands 4 lengths -Hip 3 ways 15x each Bil light touch on wall for balance -Neutral stance then staggered with shld horz add/abd 10x in each position. -Sit to stand from 3rd step 10x: VC to drive feet into the floor as she stood up -Bicycle in corner holding wall 5 min in horseback on yellow noodle.  07/25/2024 NuStep Level 5 5 mins- PT present to discuss status Seated scap retraction x 12 Seated cervical retraction x 12 3way stability ball stretch x 8 each direction  Sidelying open books x 8 Hooklying TA activation with red stability ball x 15  Trigger Point Dry Needling  Initial Treatment: Pt instructed on Dry Needling rational, procedures, and possible side effects. Pt instructed to expect mild to moderate muscle soreness later in the day and/or into the next day.  Pt instructed in methods to reduce muscle soreness. Pt instructed to continue prescribed HEP. Patient was educated on signs and symptoms of infection and other risk factors and advised to seek medical attention should they occur.  Patient verbalized understanding of these instructions and education.   Patient Verbal Consent Given: Yes Education Handout Provided: Previously Provided Muscles Treated: bilateral lumbar multifidi; bilateral gluteals  Electrical Stimulation Performed: No Treatment Response/Outcome: Utilized skilled palpation to identify trigger  points.  During dry needling able to palpate muscle twitch and muscle elongation    Single knee to chest x 30 sec bilateral  Supine glute stretch x 30 sec bilateral   Date: 07/23/24 Pt seen for aquatic therapy today.  Treatment took place in water 3.5-4.75 ft in depth at the [facility] pool. Temp of water was 91.  Pt entered/exited the pool via stairs independently with bil rail.   - unsupported walking forward/ backward - side stepping with arm add/abdct  - straddling noodle with doggy paddle UE: cycling; with UE support on wall: hip abdct/ add ; hip flexion/ extension - side stepping with arm add/abdct with short hollow noodles  - staggered stance with horiz abdct/ add with short hollow noodles - queen nods - gentle - forward march with alternating row with short hollow noodles - suitcase carry with  bil/ single rainbow hand float at side while marching forward/ backward  - STS from bench in water with feet on blue step with cues for neutral head and forward arm reach x 5 - STS from 3rd step in water, x 5    Pt requires the buoyancy and hydrostatic pressure of water for support, and to offload joints by unweighting joint load by at least 50 % in navel deep water and by at least 75-80% in chest to neck deep water.  Viscosity of the water is needed for resistance of strengthening. Water current perturbations provides challenge to standing balance requiring increased core activation.     PATIENT EDUCATION:  Education details: intro to aquatic therapy  Person educated: Patient Education method: Explanation, Demonstration Education comprehension: verbalized understanding, returned demonstration, and needs further education  HOME EXERCISE PROGRAM: Access Code: [email or web address] URL: [email or web address] Date: 07/25/2024 Prepared by: Kristeen Sar  Exercises - Seated Scapular Retraction  - 1 x daily - 7 x weekly - 1 sets - 10 reps - Seated Cervical Retraction  - 1 x  daily - 7 x weekly - 1 sets - 10  reps - Sidelying Thoracic Rotation with Open Book  - 1 x daily - 7 x weekly - 1 sets - 10 reps - Supine Single Knee to Chest Stretch  - 1 x daily - 7 x weekly - 2 sets - 20-30 hold - Supine Gluteus Stretch  - 1 x daily - 7 x weekly - 2 sets - 20-30 hold  ASSESSMENT:  CLINICAL IMPRESSION: Pt arrives to aquatic PT treatment with essentially no pain. She reports doing very well after needling with no soreness. Increased some resistance in the pool today to challenge strength. She tolerated it all very well with no pain or fatigue.    From initial evaluation:  Patient is a 53 y.o. female who was seen today for physical therapy evaluation and treatment for neck and back pain. Sophy presents with chronic neck and back pain there interferes with her functional mobility and ability to perform work duties. Her cervical rotation is limited and painful and affects her ability to look in her blind spots. She reports having daily headaches that sometimes last multiple days if she is having a bad flare up. She reports numbness and tingling in her upper and lower extremities that also affects her grip strength. Based on evaluation noted poor posture, muscle weakness, and decreased ROM. When testing light touch sensation patient did not feel any points on her thigh and lower leg bilaterally . She felt deep pressure sensation. Educated patient to return to her MD to get a nerve conduction study to see if she has neuropathy. She said she was going to get tested for it in the past, but never got it done. She understood the safety aspect involved of not having full sensation in her lower extremities. Patient will benefit from skilled PT to address the below impairments and improve overall function.   OBJECTIVE IMPAIRMENTS: decreased endurance, decreased mobility, difficulty walking, decreased ROM, decreased strength, hypomobility, increased fascial restrictions, increased muscle spasms,  impaired flexibility, postural dysfunction, and pain.   ACTIVITY LIMITATIONS: carrying, lifting, bending, sitting, standing, squatting, sleeping, stairs, transfers, dressing, reach over head, and hygiene/grooming  PARTICIPATION LIMITATIONS: meal prep, cleaning, laundry, driving, shopping, community activity, and occupation  PERSONAL FACTORS: Age, Fitness, Time since onset of injury/illness/exacerbation, and 1-2 comorbidities: OA are also affecting patient's functional outcome.   REHAB POTENTIAL: Good  CLINICAL DECISION MAKING: Evolving/moderate complexity  EVALUATION COMPLEXITY: Moderate   GOALS: Goals reviewed with patient? Yes  SHORT TERM GOALS: Target date: 08/13/2024  Patient will be independent with initial HEP. Baseline:  Goal status: INITIAL  2.  Patient will report > or = to 30% improvement in sleep quality since starting PT. Baseline: disturbed for 3-4 hours Goal status: INITIAL  3.  Patient will demonstrate correct seated and standing posture with no verbal cues to decrease mechanical strain. Baseline:  Goal status: INITIAL  4.  Patient will be able to stand for at least 10 mins with < or = to 5/10 back pain for performance of ADLs and house chores.  Baseline: 5 mins Goal status: INITIAL    LONG TERM GOALS: Target date: 09/13/2024  Patient will demonstrate independence in advanced HEP. Baseline:  Goal status: INITIAL  2.  Patient will report > or = to 60% improvement in neck and back pain since starting PT. Baseline:  Goal status: INITIAL  3.  Patient will complete 5STS in < or= to 19 sec due to improved muscle strength and to be closer to age predicted norms. Baseline: 23.40 sec Goal status: INITIAL  4.  Patient to score < or = to 29/50 on NDI due to improved function and decreased disability.  Baseline: 34/50  Goal status: INITIAL  5.  Patient to score < or = to 25 on ODI due to decreased self perceived disability.  Baseline: 35/50 Goal status:  INITIAL  6.  Patient will demonstrate improved cervical ROM by 5-8 degrees to be able to look in her blind spot while driving. Baseline:  Goal status: INITIAL   PLAN:  PT FREQUENCY: 2x/week  PT DURATION: 8 weeks  PLANNED INTERVENTIONS: [id number]- PT Re-evaluation, 97110-Therapeutic exercises, 97530- Therapeutic activity, 97112- Neuromuscular re-education, 97535- Self Care, [id number]- Manual therapy, [phone]- Gait training, [phone]- Canalith repositioning, [id number]- Aquatic Therapy, [phone]- Electrical stimulation (unattended), [phone]- Electrical stimulation (manual), [id number]- Vasopneumatic device, [id number]- Ultrasound, [id number]- Traction (mechanical), [id number]- Ionotophoresis 4mg /ml Dexamethasone, [id number] (1-2 muscles), [id number] (3+ muscles)- Dry Needling, Patient/Family education, Balance training, Stair training, Taping, Joint mobilization, Joint manipulation, Spinal manipulation, Spinal mobilization, Vestibular training, Cryotherapy, and Moist heat  PLAN FOR NEXT SESSION:[place] next: initiate core exercises; cervical melt method; possibly cervical traction; assess grip strength; nerve glides.  Delon Darner, PTA 08/02/24 3:10 PM

## 2024-08-05 ENCOUNTER — Ambulatory Visit: Admitting: Physical Therapy

## 2024-08-05 ENCOUNTER — Encounter: Payer: Self-pay | Admitting: Physical Therapy

## 2024-08-05 DIAGNOSIS — M6281 Muscle weakness (generalized): Secondary | ICD-10-CM

## 2024-08-05 DIAGNOSIS — M5459 Other low back pain: Secondary | ICD-10-CM

## 2024-08-05 DIAGNOSIS — M542 Cervicalgia: Secondary | ICD-10-CM

## 2024-08-05 DIAGNOSIS — R252 Cramp and spasm: Secondary | ICD-10-CM

## 2024-08-05 NOTE — Therapy (Signed)
 OUTPATIENT PHYSICAL THERAPY CERVICAL/ LUMBAR TREATMENT   Patient Name: Christy Holland MRN: [id number] DOB:Oct 30, 1970, 53 y.o., female Today's Date: 08/05/2024  END OF SESSION:  PT End of Session - 08/05/24 0944     Visit Number 6    Date for Recertification  09/13/24    Authorization Type [employer] / [employer] Approved 8 visits- 07/16/24-09/13/24-auth# [id number]  (DN signed 10/13)    Authorization Time Period 9/23-11/21/25    Authorization - Visit Number 5    Authorization - Number of Visits 8    PT Start Time 0855    PT Stop Time 0935    PT Time Calculation (min) 40 min    Activity Tolerance Patient tolerated treatment well    Behavior During Therapy [facility] for tasks assessed/performed             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/2017   Rhinitis, chronic 08/21/2017   Sinusitis 02/13/2018   Snoring 08/21/2017   Past Surgical History:  Procedure Laterality Date   PVC ABLATION N/A 06/18/2021   Procedure: PVC ABLATION;  Surgeon: Waddell Danelle ORN, MD;  Location: [facility];  Service: Cardiovascular;  Laterality: N/A;   Patient Active Problem List   Diagnosis Date Noted   Menorrhagia 05/01/2024   Osteoarthritis of right knee 01/26/2024   Bilateral carpal tunnel syndrome 12/27/2023   Arthralgia of right knee 11/22/2023   Low back pain 11/14/2023   Neck pain 11/14/2023   Episodic recurrent vertigo 07/31/2023   Balance disorder 07/17/2023   Dizziness 07/17/2023   Carpal tunnel syndrome on both sides 04/04/2022   Chronic right shoulder pain 04/04/2022   Cervical radiculopathy 04/04/2022   Class 2 severe obesity due to excess calories with serious comorbidity and body mass index (BMI) of 36.0 to 36.9 in adult 03/17/2022   History of cardiac radiofrequency ablation 01/18/2022   Insomnia 11/12/2021   Mass of left  breast 10/19/2021   Acne vulgaris 09/28/2021   Dyspnea on exertion 04/03/2021   Holter monitor, abnormal 08/22/2019   Frequent PVCs 03/01/2019   Sinusitis 02/13/2018   Chronic maxillary sinusitis 08/21/2017   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/21/2017   Rhinitis, chronic 08/21/2017   OSA (obstructive sleep apnea) 08/21/2017    PCP: Leonce Carola PARAS, PA-C   REFERRING PROVIDER: Faye Lauraine PARAS, FNP  REFERRING DIAG:  Diagnosis  M54.12 (ICD-10-CM) - Radiculopathy, cervical region    THERAPY DIAG:  Cervicalgia  Muscle weakness (generalized)  Other low back pain  Cramp and spasm  Rationale for Evaluation and Treatment: Rehabilitation  ONSET DATE: Chronic   SUBJECTIVE:  SUBJECTIVE STATEMENT:  Patient reports she is doing good today. She has having some lower back tightness. She felt relief after dry needling for a few days.   From initial evaluation:  Patient presents with chronic neck pain that hurts constantly. She has done injections and nerve blocks in the past. She reports having DDD and compressed nerves. It is more challenging looking to the right than the left. She reports numbness and tingling down her arms to all of her fingers. She reports differences in her grip strength and she drops objects. Pain impacts her sleep at night. She has daily headaches that last multiple days when she is having a bad flare up.She also reports chronic back pain that interferes with her standing and sitting tolerance. She has numbness and tingling in both of her legs that goes all the way down to her feet.  Hand dominance: Right  PERTINENT HISTORY:  Hx back neck and right shoulder pain; OA of right knee  PAIN:  Are you having pain? Yes: NPRS scale: 7/ 10 Pain location: bilateral  cervical spine starting at base of head; lower back at beltline Pain description: achy; sharp Aggravating factors: cervical rotation (Rt > Lt); driving; standing> 5 mins; sitting > 5-10 mins; walking> 5-24mins Relieving factors: Ice/ Heat; stretching  PRECAUTIONS: None  RED FLAGS: None     WEIGHT BEARING RESTRICTIONS: No  FALLS:  Has patient fallen in last 6 months? 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  DIAGNOSTIC FINDINGS:  Cervical Spine MR 12/29/2013 IMPRESSION:  1. Cervical spondylosis and degenerative disc disease, causing  moderate impingement at C5-6 and C6-7 as detailed above.   PATIENT SURVEYS:  NDI: 34/50 68%  Minimum Detectable Change (90% confidence): 5 points or 10% points  ODI: 35/50 70%  COGNITION: Overall cognitive status: Within functional limits for tasks assessed  SENSATION: Numbness and tingling in UE and LEs 0/4 on light touch sensation bilateral but able to feel deeper pressure sensation  POSTURE: rounded shoulders and forward head   CERVICAL ROM: * pain  Active ROM A/PROM (deg) eval  Flexion 22*  Extension 25*  Right lateral flexion 20*  Left lateral flexion 12*  Right rotation 30*  Left rotatio/n 30*   (Blank row/s = not tested)    UPPER  EXTREMITY MMT: Grossly 4-/5 pain with shoulder abduction & flexion testing Grip strength to be tested    LOWER  EXTREMITY FFU:Hmnddob 4-  to 4 /5 bilateral  (very weak hip flexors, abductors)   FUNCTIONAL TESTS:  5 times sit to stand: 23.40sec hands on knees  TREATMENT DATE:  [facility] Adult PT Treatment:                                              08/05/2024  NuStep Level 6 6 mins- PT present to discuss status 3way stability ball stretch x 8 each direction  Trigger Point Dry Needling  Subsequent Treatment: Instructions provided previously at initial dry needling treatment.   Patient Verbal Consent Given: Yes Education Handout Provided: Previously Provided Muscles Treated: bilateral lumbar multifidi; bilateral upper traps ; bilateral cervical suboccipitals  Electrical Stimulation Performed: No Treatment Response/Outcome: Utilized skilled palpation to identify trigger points.  During dry needling able to palpate muscle twitch and muscle elongation  Soft tissue mobilization performed to further promote tissue elongation and decreased pain.    Standing shoulder row & extension with red TB (added to HEP)       08/02/24:Pt seen for aquatic therapy today.  Treatment took place in water 3.5-4.75 ft in depth at the [facility] pool. Temp of water was 91.  Pt entered/exited the pool via stairs independently with bil rail. -75% water depth water walking: forward/back 6 lengths with running arms, sidestepping with short hollow noodle shld abd/add 6 lengths -High knee march 4 lengths holding rainbow floats by her side. - 1 yellow float by the right side walk 4 length, then switch hands 4 lengths -Hip 3 ways 15x each Bil light touch on wall for balance -Neutral stance then staggered with shld horz add/abd 10x in each position. -Sit to stand from 3rd step 10x: VC to drive feet into the floor as she stood up -Bicycle in corner holding wall 5 min in horseback on yellow noodle.  07/25/2024 NuStep Level 5 5 mins- PT present to discuss status Seated scap retraction x 12 Seated cervical retraction x 12 3way stability ball  stretch x 8 each direction  Sidelying open books x 8 Hooklying TA activation with red stability ball x 15  Trigger Point Dry Needling  Initial Treatment: Pt instructed on Dry Needling rational, procedures, and possible side effects. Pt instructed to expect mild to moderate muscle soreness later in the day and/or into the next day.  Pt instructed in methods to reduce muscle soreness. Pt instructed to continue prescribed HEP. Patient was educated on signs and symptoms of infection and other risk factors and advised to seek medical attention should they occur.  Patient verbalized understanding of these instructions and education.   Patient Verbal Consent Given: Yes Education Handout Provided: Previously Provided Muscles Treated: bilateral lumbar multifidi; bilateral gluteals  Electrical Stimulation Performed: No Treatment Response/Outcome: Utilized skilled palpation to identify trigger points.  During dry needling able to palpate muscle twitch and muscle elongation    Single knee to chest x 30 sec bilateral  Supine glute stretch x 30 sec bilateral      PATIENT EDUCATION:  Education details: intro to aquatic therapy  Person educated: Patient Education method: Explanation, Demonstration Education comprehension: verbalized understanding, returned demonstration, and needs further education  HOME EXERCISE PROGRAM: Access Code: [email or web address] URL: [email or web address] Date: 08/05/2024 Prepared by: Kristeen Sar  Exercises - Seated Scapular Retraction  - 1 x daily - 7 x weekly - 1 sets - 10 reps - Seated Cervical Retraction  - 1 x daily - 7 x weekly - 1 sets - 10 reps - Sidelying Thoracic Rotation with Open Book  - 1 x daily - 7 x weekly - 1 sets - 10 reps - Supine Single Knee to Chest Stretch  - 1 x daily - 7 x weekly - 2 sets - 20-30 hold - Supine Gluteus Stretch  - 1 x daily - 7 x weekly - 2 sets - 20-30 hold - Standing Shoulder Row with Anchored Resistance  - 1 x daily - 7  x weekly - 2 sets -  10 reps - Shoulder extension with resistance - Neutral  - 1 x daily - 7 x weekly - 2 sets - 10 reps  ASSESSMENT:  CLINICAL IMPRESSION:  Loree verbalized increased lumbar tightness today. She responded well to dry needling last session and felt relief for a few days. Updated HEP to include postural strengthening exercises. Dry needling was successful at eliciting muscle twitch and elongation for all targeted muscles. Incorporated suboccipitals and upper traps today due to patient verbalizing feeling increased tension. Patient will benefit from skilled PT to address the below impairments and improve overall function.      From initial evaluation:  Patient is a 53 y.o. female who was seen today for physical therapy evaluation and treatment for neck and back pain. Ervin presents with chronic neck and back pain there interferes with her functional mobility and ability to perform work duties. Her cervical rotation is limited and painful and affects her ability to look in her blind spots. She reports having daily headaches that sometimes last multiple days if she is having a bad flare up. She reports numbness and tingling in her upper and lower extremities that also affects her grip strength. Based on evaluation noted poor posture, muscle weakness, and decreased ROM. When testing light touch sensation patient did not feel any points on her thigh and lower leg bilaterally . She felt deep pressure sensation. Educated patient to return to her MD to get a nerve conduction study to see if she has neuropathy. She said she was going to get tested for it in the past, but never got it done. She understood the safety aspect involved of not having full sensation in her lower extremities. Patient will benefit from skilled PT to address the below impairments and improve overall function.   OBJECTIVE IMPAIRMENTS: decreased endurance, decreased mobility, difficulty walking, decreased ROM, decreased  strength, hypomobility, increased fascial restrictions, increased muscle spasms, impaired flexibility, postural dysfunction, and pain.   ACTIVITY LIMITATIONS: carrying, lifting, bending, sitting, standing, squatting, sleeping, stairs, transfers, dressing, reach over head, and hygiene/grooming  PARTICIPATION LIMITATIONS: meal prep, cleaning, laundry, driving, shopping, community activity, and occupation  PERSONAL FACTORS: Age, Fitness, Time since onset of injury/illness/exacerbation, and 1-2 comorbidities: OA are also affecting patient's functional outcome.   REHAB POTENTIAL: Good  CLINICAL DECISION MAKING: Evolving/moderate complexity  EVALUATION COMPLEXITY: Moderate   GOALS: Goals reviewed with patient? Yes  SHORT TERM GOALS: Target date: 08/13/2024  Patient will be independent with initial HEP. Baseline:  Goal status: INITIAL  2.  Patient will report > or = to 30% improvement in sleep quality since starting PT. Baseline: disturbed for 3-4 hours Goal status: INITIAL  3.  Patient will demonstrate correct seated and standing posture with no verbal cues to decrease mechanical strain. Baseline:  Goal status: INITIAL  4.  Patient will be able to stand for at least 10 mins with < or = to 5/10 back pain for performance of ADLs and house chores.  Baseline: 5 mins Goal status: INITIAL    LONG TERM GOALS: Target date: 09/13/2024  Patient will demonstrate independence in advanced HEP. Baseline:  Goal status: INITIAL  2.  Patient will report > or = to 60% improvement in neck and back pain since starting PT. Baseline:  Goal status: INITIAL  3.  Patient will complete 5STS in < or= to 19 sec due to improved muscle strength and to be closer to age predicted norms. Baseline: 23.40 sec Goal status: INITIAL   4.  Patient to score <  or = to 29/50 on NDI due to improved function and decreased disability.  Baseline: 34/50  Goal status: INITIAL  5.  Patient to score < or = to 25 on  ODI due to decreased self perceived disability.  Baseline: 35/50 Goal status: INITIAL  6.  Patient will demonstrate improved cervical ROM by 5-8 degrees to be able to look in her blind spot while driving. Baseline:  Goal status: INITIAL   PLAN:  PT FREQUENCY: 2x/week  PT DURATION: 8 weeks  PLANNED INTERVENTIONS: 97164- PT Re-evaluation, 97110-Therapeutic exercises, 97530- Therapeutic activity, 97112- Neuromuscular re-education, 97535- Self Care, 02859- Manual therapy, [phone]- Gait training, [phone]- Canalith repositioning, [id number]- Aquatic Therapy, [phone]- Electrical stimulation (unattended), [phone]- Electrical stimulation (manual), [id number]- Vasopneumatic device, [id number]- Ultrasound, [id number]- Traction (mechanical), [id number]- Ionotophoresis 4mg /ml Dexamethasone, [id number] (1-2 muscles), [id number] (3+ muscles)- Dry Needling, Patient/Family education, Balance training, Stair training, Taping, Joint mobilization, Joint manipulation, Spinal manipulation, Spinal mobilization, Vestibular training, Cryotherapy, and Moist heat  PLAN FOR NEXT SESSION:[place] next: assess updated HEP & dry needling; core and postural strengthening; cervical melt method;   Kristeen Sar, PT 08/05/24 9:46 AM

## 2024-08-05 NOTE — Progress Notes (Unsigned)
 HPI F never smoker followed for Insomnia, Minimal OSA( 6.6/ hr)/ Snoring,  complicated by Dyspnea on exertion, PVCs/ Ablation, Chronic Rhinitis, Chronic Maxillary /sinusitis, Tonsil Hypertrophy, Obesity,  HST ([facility]) 05/08/2019- AHI 6.6/ hr, body weight 218 lbs PFT 05/08/24- WNL except DLCO relatively increased for lung volume     Done for c/o dyspnea on exertion ===============================================================   04/01/24- 52 yoF never smoker followed for Insomnia, Minimal OSA( 6.6/ hr)/ Snoring,  complicated by Dyspnea on exertion, PVCs/ Ablation, Chronic Rhinitis, Chronic Maxillary /sinusitis, Tonsil Hypertrophy, Obesity,  -Clonazepam  0.5 mg 1-2 CPAP  auto 5-11/ [employer]  [employer] - compliance  SD card is blank Full face mask won't seal. She does admit she feels better off with CPAP Clonazepam  does help sleep quality- discussed.  05/28/24- 52 yoF never smoker followed for Insomnia, Minimal OSA( 6.6/ hr)/ Snoring,  complicated by Dyspnea on exertion, PVCs/ Ablation, Chronic Rhinitis, Chronic Maxillary /sinusitis, Tonsil Hypertrophy, Obesity,  -Clonazepam  0.5 mg 1-2 CPAP  auto 5-11/ [employer]  [employer] - compliance  - Body weight today-147 lbs PFT 05/08/24- WNL except DLCO relatively increased for lung volume     Done for c/o dyspnea on exertion [employer] refit mask and put in new SD card after last visit, but nobody told her to bring the card today. Feels drowsy, but still not sleeping deeply on clonazepam  2x 0.5 mg. She will try 3 tabs.  Dyspnea on exertion with some dry cough, no wheeze. Discussed the use of AI scribe software for clinical note transcription with the patient, who gave verbal consent to proceed.  History of Present Illness   Christy Holland is a 53 year old female who presents with sleep disturbances and shortness of breath on exertion.  She uses a CPAP machine for sleep apnea, which is beneficial, with no issues regarding the  machine or mask. Clonazepam , 1 mg at night, causes drowsiness but does not maintain sleep. She experiences shortness of breath upon exertion and denies smoking. She is unaware of any heart problems but had excessive PVCs that were corrected. Occasional dry cough is present. She was slightly anemic with a hemoglobin level of 11.7 in June. No wheezing is noted.     Cardiac hx ablation for PVCs, but no angina or CHF. Mild anemia. Weighed 231 lbs in 2022. Now 147.  Assessment and Plan;    Obstructive sleep apnea Obstructive sleep apnea well-managed with CPAP. Improved quality of life reported. SD card unavailable for review. - Contact [employer] for CPAP machine download. - Request she bring SD card for nurse download.  Insomnia associated with obstructive sleep apnea Insomnia persists despite clonazepam  1 mg at night causing drowsiness but not maintaining sleep. - Advise taking three x 0.5 mg clonazepam  tablets at night with caution for daytime effects.  Chronic cough, Dyspnea on exertion Chronic cough, occasionally productive, with normal pulmonary function tests and no significant anemia or smoking history. Previous chest x-ray normal. - Order chest x-ray to rule out contributing issues. - Provide Trelegy inhaler sample for trial use, instruct once daily use and mouth rinse.      08/06/24- 53 yoF never smoker followed for Insomnia, Minimal OSA( 6.6/ hr)/ Snoring,  complicated by Dyspnea on exertion, PVCs/ Ablation, Chronic Rhinitis, Chronic Maxillary /sinusitis, Tonsil Hypertrophy, Obesity,  -Clonazepam  0.5 mg 1-2 CPAP  auto 5-11/ [employer]  [employer] - compliance  - Body weight today- We gave Trelegy sample for DOE last visit.  CXR  05/28/24 IMPRESSION: No active cardiopulmonary disease.  ROS-see HPI   + = positive Constitutional:    +weight loss, night sweats, fevers, chills, f+atigue, lassitude. HEENT:    +headaches, difficulty swallowing, tooth/dental problems, sore  throat,       sneezing, itching, ear ache, +nasal congestion, post nasal drip, snoring CV:    chest pain, orthopnea, PND, swelling in lower extremities, anasarca,                                   dizziness, palpitations Resp:   shortness of breath with exertion or at rest.                productive cough,   non-productive cough, coughing up of blood.              change in color of mucus.  wheezing.   Skin:    rash or lesions. GI:  No-   heartburn, indigestion, abdominal pain, nausea, vomiting, diarrhea,                 change in bowel habits, loss of appetite GU: dysuria, change in color of urine, no urgency or frequency.   flank pain. MS:   joint pain, stiffness, decreased range of motion, back pain. Neuro-     nothing unusual Psych:  change in mood or affect.  depression or anxiety.   memory loss.  OBJ- Physical Exam General- Alert, Oriented, Affect-appropriate, Distress- none acute, Slender- note significant weight loss since 2022( Ozempic) Skin- rash-none, lesions- none, excoriation- none Lymphadenopathy- none Head- atraumatic            Eyes- Gross vision intact, PERRLA, conjunctivae and secretions clear            Ears- Hearing, canals-normal            Nose- +turbinate edema, no-Septal dev, mucus, polyps, erosion, perforation             Throat- Mallampati II , mucosa clear , drainage- none, tonsils- atrophic, + teeth Neck- flexible , trachea midline, no stridor , thyroid  nl, carotid no bruit Chest - symmetrical excursion , unlabored           Heart/CV- RRR , no murmur , no gallop  , no rub, nl s1 s2                           - JVD- none , edema- none, stasis changes- none, varices- none           Lung- clear to P&A, wheeze- none, cough- none , dullness-none, rub- none           Chest wall-  Abd-  Br/ Gen/ Rectal- Not done, not indicated Extrem- cyanosis- none, clubbing, none, atrophy- none, strength- nl Neuro- grossly intact to observation

## 2024-08-06 ENCOUNTER — Encounter: Payer: Self-pay | Admitting: Internal Medicine

## 2024-08-06 ENCOUNTER — Ambulatory Visit (INDEPENDENT_AMBULATORY_CARE_PROVIDER_SITE_OTHER): Admitting: Internal Medicine

## 2024-08-06 VITALS — BP 101/67 | HR 81 | Temp 98.4°F | Ht 67.0 in | Wt 148.4 lb

## 2024-08-06 DIAGNOSIS — Z23 Encounter for immunization: Secondary | ICD-10-CM | POA: Diagnosis not present

## 2024-08-06 DIAGNOSIS — R0602 Shortness of breath: Secondary | ICD-10-CM

## 2024-08-06 DIAGNOSIS — G4733 Obstructive sleep apnea (adult) (pediatric): Secondary | ICD-10-CM

## 2024-08-06 MED ORDER — ALBUTEROL SULFATE HFA 108 (90 BASE) MCG/ACT IN AERS: 2.0000 | INHALATION_SPRAY | Freq: Four times a day (QID) | RESPIRATORY_TRACT | 6 refills | Status: DC | PRN

## 2024-08-06 NOTE — Patient Instructions (Addendum)
 Order- DME [employer]- please replace old CPAP machine, motor life exceeded. Auto 5-11, mask of choice, humidifier, supplies, AirView/ card  Script sent to albuterol rescue inhaler- inhale 2 puffs every 4-6 hours as needed for shortness of breath/ wheeze.  Order- flu vax standard  We talked about dehumidifiers from a hardware store to reduce humidity and mold in a home.

## 2024-08-07 ENCOUNTER — Ambulatory Visit (HOSPITAL_BASED_OUTPATIENT_CLINIC_OR_DEPARTMENT_OTHER): Admitting: Physical Therapy

## 2024-08-08 ENCOUNTER — Encounter (HOSPITAL_BASED_OUTPATIENT_CLINIC_OR_DEPARTMENT_OTHER): Attending: Family Medicine | Admitting: Physical Therapy

## 2024-08-08 DIAGNOSIS — M542 Cervicalgia: Secondary | ICD-10-CM | POA: Insufficient documentation

## 2024-08-08 DIAGNOSIS — M5459 Other low back pain: Secondary | ICD-10-CM | POA: Insufficient documentation

## 2024-08-08 DIAGNOSIS — R252 Cramp and spasm: Secondary | ICD-10-CM | POA: Diagnosis present

## 2024-08-08 DIAGNOSIS — M6281 Muscle weakness (generalized): Secondary | ICD-10-CM | POA: Diagnosis present

## 2024-08-08 NOTE — Therapy (Signed)
 OUTPATIENT PHYSICAL THERAPY CERVICAL/ LUMBAR TREATMENT   Patient Name: Christy Holland MRN: [id number] DOB:06/20/1971, 53 y.o., female Today's Date: 08/08/2024  END OF SESSION:  PT End of Session - 08/08/24 1627     Visit Number 7    Date for Recertification  09/13/24    Authorization Type [employer] / [employer] Approved 8 visits- 07/16/24-09/13/24-auth# [id number]  (DN signed 10/13)    Authorization Time Period 9/23-11/21/25    Authorization - Visit Number 6    Authorization - Number of Visits 8    PT Start Time 1618    PT Stop Time 1656    PT Time Calculation (min) 38 min    Activity Tolerance Patient tolerated treatment well    Behavior During Therapy [facility] for tasks assessed/performed             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/2017   Rhinitis, chronic 08/21/2017   Sinusitis 02/13/2018   Snoring 08/21/2017   Past Surgical History:  Procedure Laterality Date   PVC ABLATION N/A 06/18/2021   Procedure: PVC ABLATION;  Surgeon: Waddell Danelle ORN, MD;  Location: [facility];  Service: Cardiovascular;  Laterality: N/A;   Patient Active Problem List   Diagnosis Date Noted   Menorrhagia 05/01/2024   Osteoarthritis of right knee 01/26/2024   Bilateral carpal tunnel syndrome 12/27/2023   Arthralgia of right knee 11/22/2023   Low back pain 11/14/2023   Neck pain 11/14/2023   Episodic recurrent vertigo 07/31/2023   Balance disorder 07/17/2023   Dizziness 07/17/2023   Carpal tunnel syndrome on both sides 04/04/2022   Chronic right shoulder pain 04/04/2022   Cervical radiculopathy 04/04/2022   Class 2 severe obesity due to excess calories with serious comorbidity and body mass index (BMI) of 36.0 to 36.9 in adult 03/17/2022   History of cardiac radiofrequency ablation 01/18/2022   Insomnia 11/12/2021   Mass of left  breast 10/19/2021   Acne vulgaris 09/28/2021   Dyspnea on exertion 04/03/2021   Holter monitor, abnormal 08/22/2019   Frequent PVCs 03/01/2019   Sinusitis 02/13/2018   Chronic maxillary sinusitis 08/21/2017   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/21/2017   Rhinitis, chronic 08/21/2017   OSA (obstructive sleep apnea) 08/21/2017    PCP: Leonce Carola PARAS, PA-C   REFERRING PROVIDER: Faye Lauraine PARAS, FNP  REFERRING DIAG:  Diagnosis  M54.12 (ICD-10-CM) - Radiculopathy, cervical region    THERAPY DIAG:  Cervicalgia  Muscle weakness (generalized)  Other low back pain  Cramp and spasm  Rationale for Evaluation and Treatment: Rehabilitation  ONSET DATE: Chronic   SUBJECTIVE:  SUBJECTIVE STATEMENT:  Patient reports she had temporary relief with DN last session.  Pool provides relief for remainder of day, but returns following day.  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   She reports she has been trying to work on core strength and reports some improvement in tightness.    From initial evaluation:  Patient presents with chronic neck pain that hurts constantly. She has done injections and nerve blocks in the past. She reports having DDD and compressed nerves. It is more challenging looking to the right than the left. She reports numbness and tingling down her arms to all of her fingers. She reports differences in her grip strength and she drops objects. Pain impacts her sleep at night. She has daily headaches that last multiple days when she is having a bad flare up.She also reports chronic back pain that interferes with her standing and sitting tolerance. She has numbness and tingling in both of her legs that goes all the way down to her feet.  Hand dominance: Right  PERTINENT  HISTORY:  Hx back neck and right shoulder pain; OA of right knee  PAIN:  Are you having pain? Yes: NPRS scale: 8-9/ 10 Pain location: bilateral cervical spine starting at base of head; lower back at beltline Pain description: achy; sharp Aggravating factors: cervical rotation (Rt > Lt); driving; standing> 5 mins; sitting > 5-10 mins; walking> 5-62mins Relieving factors: Ice/ Heat; stretching  PRECAUTIONS: None  RED FLAGS: None     WEIGHT BEARING RESTRICTIONS: No  FALLS:  Has patient fallen in last 6 months? 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  DIAGNOSTIC FINDINGS:  Cervical Spine MR 12/29/2013 IMPRESSION:  1. Cervical spondylosis and degenerative disc disease, causing  moderate impingement at C5-6 and C6-7 as detailed above.   PATIENT SURVEYS:  NDI: 34/50 68%  Minimum Detectable Change (90% confidence): 5 points or 10% points  ODI: 35/50 70%  COGNITION: Overall cognitive status: Within functional limits for tasks assessed  SENSATION: Numbness and tingling in UE and LEs 0/4 on light touch sensation bilateral but able to feel deeper pressure sensation  POSTURE: rounded shoulders and forward head   CERVICAL ROM: * pain  Active ROM A/PROM (deg) eval AROM  08/08/24  Flexion 22*   Extension 25*   Right lateral flexion 20*   Left lateral flexion 12*   Right rotation 30* 40  Left rotatio/n 30* 42    (Blank row/s = not tested)    UPPER  EXTREMITY MMT: Grossly 4-/5 pain with shoulder abduction & flexion testing Grip strength to be tested    LOWER  EXTREMITY FFU:Hmnddob 4- to 4 /5 bilateral  (very weak hip flexors, abductors)   FUNCTIONAL TESTS:  5 times sit to stand: 23.40sec hands on knees  TREATMENT DATE:  08/08/24:Pt seen for aquatic therapy today.  Treatment took place in water 3.5-4.75 ft in depth at the [facility] pool. Temp of water was 91.  Pt entered/exited the pool via stairs independently with  bil rail.  -75% water depth water walking: forward/back 3 laps with running arms, sidestepping with arm add/ abdct 2 laps - side stepping with arm add/abdct with rainbow hand floats -> yellow hand floats - suitcase carry with bil and unilateral yellow hand floats under water at side, walking forward/ backward  - UE on yellow hand floats:  3 way LE kick x 5 each side -High knee march with reciprocal row with yellow hand floats - bow and arrow with same side step back 2 x 5 - staggered stance with tricep press down with yellow hand floats - TrA set with rainbow float pull down in wide stance x 10 -Bicycle in corner holding wall 5 min in horseback on yellow noodle  [facility] Adult PT Treatment:                                              08/05/2024  NuStep Level 6 6 mins- PT present to discuss status 3way stability ball stretch x 8 each direction   Trigger Point Dry Needling  Subsequent Treatment: Instructions provided previously at initial dry needling treatment.   Patient Verbal Consent Given: Yes Education Handout Provided: Previously Provided Muscles Treated: bilateral lumbar multifidi; bilateral upper traps ; bilateral cervical suboccipitals  Electrical Stimulation Performed: No Treatment Response/Outcome: Utilized skilled palpation to identify trigger points.  During dry needling able to palpate muscle twitch and muscle elongation  Soft tissue mobilization  performed to further promote tissue elongation and decreased pain.    Standing shoulder row & extension with red TB (added to HEP)  08/02/24:Pt seen for aquatic therapy today.  Treatment took place in water 3.5-4.75 ft in depth at the [facility] pool. Temp of water was 91.  Pt entered/exited the pool via stairs independently with bil rail. -75% water depth water walking: forward/back 6 lengths with running arms, sidestepping with short hollow noodle shld abd/add 6 lengths -High knee march 4 lengths holding rainbow floats by her side. - 1 yellow float by the right side walk 4 length, then switch hands 4 lengths -Hip 3 ways 15x each Bil light touch on wall for balance -Neutral stance then staggered with shld horz add/abd 10x in each position. -Sit to stand from 3rd step 10x: VC to drive feet into the floor as she stood up -Bicycle in corner holding wall 5 min in horseback on yellow noodle.  07/25/2024 NuStep Level 5 5 mins- PT present to discuss status Seated scap retraction x 12 Seated cervical retraction x 12 3way stability ball stretch x 8 each direction  Sidelying open books x 8 Hooklying TA activation with red stability ball x 15  Trigger Point Dry Needling  Initial Treatment: Pt instructed on Dry Needling rational, procedures, and possible side effects. Pt instructed to expect mild to moderate muscle soreness later in the day and/or into the next day.  Pt instructed in methods to reduce muscle soreness. Pt instructed to continue prescribed HEP. Patient was educated on signs and symptoms of infection and other risk factors and advised to seek medical attention should they occur.  Patient verbalized understanding of these instructions and education.   Patient Verbal Consent Given: Yes Education Handout Provided: Previously Provided Muscles Treated: bilateral lumbar multifidi; bilateral gluteals  Electrical Stimulation Performed: No Treatment Response/Outcome: Utilized  skilled palpation to identify trigger points.  During dry needling able to palpate muscle  twitch and muscle elongation    Single knee to chest x 30 sec bilateral  Supine glute stretch x 30 sec bilateral      PATIENT EDUCATION:  Education details: intro to aquatic therapy  Person educated: Patient Education method: Explanation, Demonstration Education comprehension: verbalized understanding, returned demonstration, and needs further education  HOME EXERCISE PROGRAM: Access Code: [email or web address] URL: [email or web address] Date: 08/05/2024 Prepared by: Kristeen Sar  Exercises - Seated Scapular Retraction  - 1 x daily - 7 x weekly - 1 sets - 10 reps - Seated Cervical Retraction  - 1 x daily - 7 x weekly - 1 sets - 10 reps - Sidelying Thoracic Rotation with Open Book  - 1 x daily - 7 x weekly - 1 sets - 10 reps - Supine Single Knee to Chest Stretch  - 1 x daily - 7 x weekly - 2 sets - 20-30 hold - Supine Gluteus Stretch  - 1 x daily - 7 x weekly - 2 sets - 20-30 hold - Standing Shoulder Row with Anchored Resistance  - 1 x daily - 7 x weekly - 2 sets - 10 reps - Shoulder extension with resistance - Neutral  - 1 x daily - 7 x weekly - 2 sets - 10 reps  ASSESSMENT:  CLINICAL IMPRESSION:  Pt demonstrated 8-10 improvement in active cervical rotation; making good progress towards LTG 6. She reported greatest relief of symptoms when suspended on noodle cycling.   Patient will benefit from skilled PT to address the below impairments and improve overall function.     From initial evaluation:  Patient is a 53 y.o. female who was seen today for physical therapy evaluation and treatment for neck and back pain. Romanda presents with chronic neck and back pain there interferes with her functional mobility and ability to perform work duties. Her cervical rotation is limited and painful and affects her ability to look in her blind spots. She reports having daily headaches that sometimes last  multiple days if she is having a bad flare up. She reports numbness and tingling in her upper and lower extremities that also affects her grip strength. Based on evaluation noted poor posture, muscle weakness, and decreased ROM. When testing light touch sensation patient did not feel any points on her thigh and lower leg bilaterally . She felt deep pressure sensation. Educated patient to return to her MD to get a nerve conduction study to see if she has neuropathy. She said she was going to get tested for it in the past, but never got it done. She understood the safety aspect involved of not having full sensation in her lower extremities. Patient will benefit from skilled PT to address the below impairments and improve overall function.   OBJECTIVE IMPAIRMENTS: decreased endurance, decreased mobility, difficulty walking, decreased ROM, decreased strength, hypomobility, increased fascial restrictions, increased muscle spasms, impaired flexibility, postural dysfunction, and pain.   ACTIVITY LIMITATIONS: carrying, lifting, bending, sitting, standing, squatting, sleeping, stairs, transfers, dressing, reach over head, and hygiene/grooming  PARTICIPATION LIMITATIONS: meal prep, cleaning, laundry, driving, shopping, community activity, and occupation  PERSONAL FACTORS: Age, Fitness, Time since onset of injury/illness/exacerbation, and 1-2 comorbidities: OA are also affecting patient's functional outcome.   REHAB POTENTIAL: Good  CLINICAL DECISION MAKING: Evolving/moderate complexity  EVALUATION COMPLEXITY: Moderate   GOALS: Goals reviewed with patient? Yes  SHORT TERM GOALS: Target date: 08/13/2024  Patient will be independent with initial HEP. Baseline:  Goal status: INITIAL  2.  Patient will report > or =  to 30% improvement in sleep quality since starting PT. Baseline: disturbed for 3-4 hours Goal status: in progress - (no change) -08/08/24  3.  Patient will demonstrate correct seated and  standing posture with no verbal cues to decrease mechanical strain. Baseline:  Goal status: INITIAL  4.  Patient will be able to stand for at least 10 mins with < or = to 5/10 back pain for performance of ADLs and house chores.  Baseline: 5 mins Goal status: INITIAL    LONG TERM GOALS: Target date: 09/13/2024  Patient will demonstrate independence in advanced HEP. Baseline:  Goal status: INITIAL  2.  Patient will report > or = to 60% improvement in neck and back pain since starting PT. Baseline:  Goal status: INITIAL  3.  Patient will complete 5STS in < or= to 19 sec due to improved muscle strength and to be closer to age predicted norms. Baseline: 23.40 sec Goal status: INITIAL   4.  Patient to score < or = to 29/50 on NDI due to improved function and decreased disability.  Baseline: 34/50  Goal status: INITIAL  5.  Patient to score < or = to 25 on ODI due to decreased self perceived disability.  Baseline: 35/50 Goal status: INITIAL  6.  Patient will demonstrate improved cervical ROM by 5-8 degrees to be able to look in her blind spot while driving. Baseline: see chart  Goal status: In progress - 08/08/24   PLAN:  PT FREQUENCY: 2x/week  PT DURATION: 8 weeks  PLANNED INTERVENTIONS: [id number]- PT Re-evaluation, 97110-Therapeutic exercises, 97530- Therapeutic activity, 97112- Neuromuscular re-education, 97535- Self Care, [id number]- Manual therapy, [phone]- Gait training, [phone]- Canalith repositioning, [phone]- Aquatic Therapy, [phone]- Electrical stimulation (unattended), [phone]- Electrical stimulation (manual), [id number]- Vasopneumatic device, [id number]- Ultrasound, [id number]- Traction (mechanical), [id number]- Ionotophoresis 4mg /ml Dexamethasone, [id number] (1-2 muscles), [id number] (3+ muscles)- Dry Needling, Patient/Family education, Balance training, Stair training, Taping, Joint mobilization, Joint manipulation, Spinal manipulation, Spinal mobilization, Vestibular training, Cryotherapy, and Moist heat  PLAN  FOR NEXT SESSION:[place] next: assess updated HEP & dry needling; core and postural strengthening; cervical melt method;   Delon Aquas, PTA 08/08/24 5:56 PM [facility] [address] [place], [place], [address] Phone: [phone]   Fax:  [phone]

## 2024-08-13 ENCOUNTER — Ambulatory Visit: Admitting: Physical Therapy

## 2024-08-16 ENCOUNTER — Ambulatory Visit: Admitting: Physical Therapy

## 2024-08-20 ENCOUNTER — Encounter: Payer: Self-pay | Admitting: Physical Therapy

## 2024-08-20 ENCOUNTER — Ambulatory Visit: Admitting: Physical Therapy

## 2024-08-20 DIAGNOSIS — M542 Cervicalgia: Secondary | ICD-10-CM

## 2024-08-20 DIAGNOSIS — M5459 Other low back pain: Secondary | ICD-10-CM

## 2024-08-20 DIAGNOSIS — M6281 Muscle weakness (generalized): Secondary | ICD-10-CM

## 2024-08-20 DIAGNOSIS — R252 Cramp and spasm: Secondary | ICD-10-CM

## 2024-08-20 NOTE — Therapy (Signed)
 OUTPATIENT PHYSICAL THERAPY CERVICAL/ LUMBAR TREATMENT   Patient Name: Christy Holland MRN: [id number] DOB:[birth year], 53 y.o., female Today's Date: 08/20/2024  END OF SESSION:  PT End of Session - 08/20/24 1109     Visit Number 8    Date for Recertification  09/13/24    Authorization Type [employer] auth pending (requested 16 10/31 to 12/26)    Authorization - Visit Number 7    Authorization - Number of Visits 8    PT Start Time 1110    PT Stop Time 1152    PT Time Calculation (min) 42 min    Activity Tolerance Patient tolerated treatment well    Behavior During Therapy [facility] for tasks assessed/performed              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/2017   Rhinitis, chronic 08/21/2017   Sinusitis 02/13/2018   Snoring 08/21/2017   Past Surgical History:  Procedure Laterality Date   PVC ABLATION N/A 06/18/2021   Procedure: PVC ABLATION;  Surgeon: Waddell Danelle ORN, MD;  Location: [facility] INVASIVE [facility];  Service: Cardiovascular;  Laterality: N/A;   Patient Active Problem List   Diagnosis Date Noted   Menorrhagia 05/01/2024   Osteoarthritis of right knee 01/26/2024   Bilateral carpal tunnel syndrome 12/27/2023   Arthralgia of right knee 11/22/2023   Low back pain 11/14/2023   Neck pain 11/14/2023   Episodic recurrent vertigo 07/31/2023   Balance disorder 07/17/2023   Dizziness 07/17/2023   Carpal tunnel syndrome on both sides 04/04/2022   Chronic right shoulder pain 04/04/2022   Cervical radiculopathy 04/04/2022   Class 2 severe obesity due to excess calories with serious comorbidity and body mass index (BMI) of 36.0 to 36.9 in adult 03/17/2022   History of cardiac radiofrequency ablation 01/18/2022   Insomnia 11/12/2021   Mass of left breast 10/19/2021   Acne vulgaris 09/28/2021   Dyspnea on exertion 04/03/2021    Holter monitor, abnormal 08/22/2019   Frequent PVCs 03/01/2019   Sinusitis 02/13/2018   Chronic maxillary sinusitis 08/21/2017   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/21/2017   Rhinitis, chronic 08/21/2017   OSA (obstructive sleep apnea) 08/21/2017    PCP: Leonce Carola PARAS, PA-C   REFERRING PROVIDER: Faye Lauraine PARAS, FNP  REFERRING DIAG:  Diagnosis  M54.12 (ICD-10-CM) - Radiculopathy, cervical region    THERAPY DIAG:  Cervicalgia  Muscle weakness (generalized)  Other low back pain  Cramp and spasm  Rationale for Evaluation and Treatment: Rehabilitation  ONSET DATE: Chronic   SUBJECTIVE:  SUBJECTIVE STATEMENT:  Pain in the neck and lower back. The pain has eased some, but trying to learn to manage without medication.   From initial evaluation:  Patient presents with chronic neck pain that hurts constantly. She has done injections and nerve blocks in the past. She reports having DDD and compressed nerves. It is more challenging looking to the right than the left. She reports numbness and tingling down her arms to all of her fingers. She reports differences in her grip strength and she drops objects. Pain impacts her sleep at night. She has daily headaches that last multiple days when she is having a bad flare up.She also reports chronic back pain that interferes with her standing and sitting tolerance. She has numbness and tingling in both of her legs that goes all the way down to her feet.  Hand dominance: Right  PERTINENT HISTORY:  Hx back neck and right shoulder pain; OA of right knee  PAIN:  Are you having pain? Yes: NPRS scale: 7/ 10 Pain location: bilateral cervical spine starting at base of head; lower back at beltline Pain description: achy; sharp Aggravating  factors: cervical rotation (Rt > Lt); driving; standing> 5 mins; sitting > 5-10 mins; walking> 5-38mins Relieving factors: Ice/ Heat; stretching  Low back 9/10 today due to increased sitting  PRECAUTIONS: None  RED FLAGS: None     WEIGHT BEARING RESTRICTIONS: No  FALLS:  Has patient fallen in last 6 months? 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  DIAGNOSTIC FINDINGS:  Cervical Spine MR 12/29/2013 IMPRESSION:  1. Cervical spondylosis and degenerative disc disease, causing  moderate impingement at C5-6 and C6-7 as detailed above.   PATIENT SURVEYS:  EVAL NDI: 34/50 68% 08/20/24  34 / 50 = 68.0 %  Minimum Detectable Change (90% confidence): 5 points or 10% points  EVAL ODI: 35/50 70% 08/20/24 35 / 50 = 70.0 %  COGNITION: Overall cognitive status: Within functional limits for tasks assessed  SENSATION: Numbness and tingling in UE and LEs 0/4 on light touch sensation bilateral but able to feel deeper pressure sensation  POSTURE: rounded shoulders and forward head   CERVICAL ROM: * pain  Active ROM A/PROM (deg) eval AROM  08/08/24 AROM  08/20/24  Flexion 22*  15  Extension 25*  22  Right lateral flexion 20*  12  Left lateral flexion 12*  15  Right rotation 30* 40   Left rotatio/n 30* 42    (Blank row/s = not tested)    UPPER  EXTREMITY MMT: Grossly 4-/5 pain with shoulder abduction & flexion  testing Grip strength to be tested    LOWER  EXTREMITY FFU:Hmnddob 4- to 4 /5 bilateral  (very weak hip flexors, abductors)   FUNCTIONAL TESTS:  5 times sit to stand: 23.40sec hands on knees  TREATMENT DATE:  [facility] Adult PT Treatment:  08/20/2024  Reauth completed NDI ODI Checked goals/ROM Standing chin tucks x 5 - cues to avoid protraction Standing shoulder row & extension with red TB x 20 ea cues for TA  Trigger Point Dry Needling  Subsequent Treatment: Instructions provided previously at initial dry needling treatment.   Patient Verbal Consent Given: Yes Education Handout Provided: Previously Provided Muscles Treated: B suboccipitals, cervical multifidi, UT, gluteals  Electrical Stimulation Performed: No Treatment Response/Outcome: Utilized skilled palpation to identify bony landmarks and trigger points.  Able to illicit twitch response and muscle elongation.  Soft tissue mobilization to muscles needled to further promote tissue elongation and decreased pain.        08/08/24:Pt seen for aquatic therapy today.  Treatment took place in water 3.5-4.75 ft in depth at the [facility] pool. Temp of water was 91.  Pt entered/exited the pool via stairs independently with bil rail.  -75% water depth water walking: forward/back 3 laps with running arms, sidestepping with arm add/ abdct 2 laps - side stepping with arm add/abdct with rainbow hand floats -> yellow hand floats - suitcase carry with bil and unilateral yellow hand floats under water at side, walking forward/ backward  - UE on yellow hand floats:  3 way LE kick x 5 each side -High knee march with reciprocal row with yellow hand floats - bow and arrow with same side step back 2 x 5 - staggered stance with tricep press down with yellow hand floats - TrA set with rainbow float pull down in wide stance x 10 -Bicycle in corner holding wall 5 min in horseback on yellow  noodle  [facility] Adult PT Treatment:                                              08/05/2024  NuStep Level 6 6 mins- PT present to discuss status 3way stability ball stretch x 8 each direction   Trigger Point Dry Needling  Subsequent Treatment: Instructions provided previously at initial dry needling treatment.   Patient Verbal Consent Given: Yes Education Handout Provided: Previously Provided Muscles Treated: bilateral lumbar multifidi; bilateral upper traps ; bilateral cervical suboccipitals  Electrical Stimulation Performed: No Treatment Response/Outcome: Utilized skilled palpation to identify trigger points.  During dry needling able to palpate muscle twitch and muscle elongation  Soft tissue mobilization performed to further promote tissue elongation and decreased pain.    Standing shoulder row & extension with red TB (added to HEP)  08/02/24:Pt seen for aquatic therapy today.  Treatment took place in water 3.5-4.75 ft in depth at the [facility] pool. Temp of water was 91.  Pt entered/exited the pool via stairs independently with bil rail. -75% water depth water walking: forward/back 6 lengths with running arms, sidestepping with short hollow noodle shld abd/add 6 lengths -High knee march 4 lengths holding rainbow floats by her side. - 1 yellow float by the right side walk 4 length, then switch hands 4 lengths -Hip 3 ways 15x each Bil light touch on wall for balance -Neutral stance then staggered with shld horz add/abd 10x in each position. -Sit to stand from 3rd step 10x: VC to drive feet into the floor as she stood up -Bicycle in corner holding wall 5 min in horseback on yellow noodle.  07/25/2024 NuStep Level 5 5 mins- PT present to discuss status Seated scap retraction x 12 Seated cervical  retraction x 12 3way stability ball stretch x 8 each direction  Sidelying open books x 8 Hooklying TA activation with red stability ball x 15  Trigger Point Dry  Needling  Initial Treatment: Pt instructed on Dry Needling rational, procedures, and possible side effects. Pt instructed to expect mild to moderate muscle soreness later in the day and/or into the next day.  Pt instructed in methods to reduce muscle soreness. Pt instructed to continue prescribed HEP. Patient was educated on signs and symptoms of infection and other risk factors and advised to seek medical attention should they occur.  Patient verbalized understanding of these instructions and education.   Patient Verbal Consent Given: Yes Education Handout Provided: Previously Provided Muscles Treated: bilateral lumbar multifidi; bilateral gluteals  Electrical Stimulation Performed: No Treatment Response/Outcome: Utilized skilled palpation to identify trigger points.  During dry needling able to palpate muscle twitch and muscle elongation    Single knee to chest x 30 sec bilateral  Supine glute stretch x 30 sec bilateral      PATIENT EDUCATION:  Education details: intro to aquatic therapy  Person educated: Patient Education method: Explanation, Demonstration Education comprehension: verbalized understanding, returned demonstration, and needs further education  HOME EXERCISE PROGRAM: Access Code: [email or web address] URL: [email or web address] Date: 08/05/2024 Prepared by: Kristeen Sar  Exercises - Seated Scapular Retraction  - 1 x daily - 7 x weekly - 1 sets - 10 reps - Seated Cervical Retraction  - 1 x daily - 7 x weekly - 1 sets - 10 reps - Sidelying Thoracic Rotation with Open Book  - 1 x daily - 7 x weekly - 1 sets - 10 reps - Supine Single Knee to Chest Stretch  - 1 x daily - 7 x weekly - 2 sets - 20-30 hold - Supine Gluteus Stretch  - 1 x daily - 7 x weekly - 2 sets - 20-30 hold - Standing Shoulder Row with Anchored Resistance  - 1 x daily - 7 x weekly - 2 sets - 10 reps - Shoulder extension with resistance - Neutral  - 1 x daily - 7 x weekly - 2 sets - 10  reps  ASSESSMENT:  CLINICAL IMPRESSION:  Patient is progressing slowly with pain control, but has met her postural and HEP short term goals. She reports relief x 2 days after the aquatic therapy. She also reports relief with DN x 2 days with HA. She reports decreased frequency and intensity of the N/T in her upper and lower extremities. Functionally, her NDI and ODI have not changed. She continues to be limited with the activities listed below, however she did report ability to stand between 5-10 min now before needing to sit versus 5 min at evaluation. Her cervical rotation has increased B, however she is still limited in all neck ROM. Ms. Holland is compliant with her HEP and continues to demonstrate potential for improvement. She would benefit from continued skilled therapy to address impairments.  I recommend 2x/wk for 8 weeks split between land and aquatic PT.   From initial evaluation:  Patient is a 53 y.o. female who was seen today for physical therapy evaluation and treatment for neck and back pain. Christy presents with chronic neck and back pain there interferes with her functional mobility and ability to perform work duties. Her cervical rotation is limited and painful and affects her ability to look in her blind spots. She reports having daily headaches that sometimes last multiple days if she is having a bad flare up.  She reports numbness and tingling in her upper and lower extremities that also affects her grip strength. Based on evaluation noted poor posture, muscle weakness, and decreased ROM. When testing light touch sensation patient did not feel any points on her thigh and lower leg bilaterally . She felt deep pressure sensation. Educated patient to return to her MD to get a nerve conduction study to see if she has neuropathy. She said she was going to get tested for it in the past, but never got it done. She understood the safety aspect involved of not having full sensation in her lower  extremities. Patient will benefit from skilled PT to address the below impairments and improve overall function.   OBJECTIVE IMPAIRMENTS: decreased endurance, decreased mobility, difficulty walking, decreased ROM, decreased strength, hypomobility, increased fascial restrictions, increased muscle spasms, impaired flexibility, postural dysfunction, and pain.   ACTIVITY LIMITATIONS: carrying, lifting, bending, sitting, standing, squatting, sleeping, stairs, transfers, dressing, reach over head, and hygiene/grooming  PARTICIPATION LIMITATIONS: meal prep, cleaning, laundry, driving, shopping, community activity, and occupation  PERSONAL FACTORS: Age, Fitness, Time since onset of injury/illness/exacerbation, and 1-2 comorbidities: OA are also affecting patient's functional outcome.   REHAB POTENTIAL: Good  CLINICAL DECISION MAKING: Evolving/moderate complexity  EVALUATION COMPLEXITY: Moderate   GOALS: Goals reviewed with patient? Yes  SHORT TERM GOALS: Target date: 08/13/2024  Patient will be independent with initial HEP. Baseline:  Goal status: MET 08/20/24  2.  Patient will report > or = to 30% improvement in sleep quality since starting PT. Baseline: disturbed for 3-4 hours Goal status: in progress - (no change) -08/20/24  3.  Patient will demonstrate correct seated and standing posture with no verbal cues to decrease mechanical strain. Baseline:  Goal status: MET 08/20/24  4.  Patient will be able to stand for at least 10 mins with < or = to 5/10 back pain for performance of ADLs and house chores.  Baseline: 5 mins Goal status: IN PROGRESS 08/20/24 average pain 6/10 in back can stand 5-10 min before needing to sit.    LONG TERM GOALS: Target date: 09/13/2024  Patient will demonstrate independence in advanced HEP. Baseline:  Goal status: IN PROGRESS  2.  Patient will report > or = to 60% improvement in neck and back pain since starting PT. Baseline:  Goal status: IN  PROGRESS  3.  Patient will complete 5STS in < or= to 19 sec due to improved muscle strength and to be closer to age predicted norms. Baseline: 23.40 sec Goal status: INITIAL   4.  Patient to score < or = to 29/50 on NDI due to improved function and decreased disability.  Baseline: 34/50  Goal status: IN PROGRESS  5.  Patient to score < or = to 25 on ODI due to decreased self perceived disability.  Baseline: 35/50 Goal status: IN PROGRESS  6.  Patient will demonstrate improved cervical ROM by 5-8 degrees to be able to look in her blind spot while driving. Baseline: see chart  Goal status: In progress - 08/20/24   PLAN:  PT FREQUENCY: 2x/week  PT DURATION: 8 weeks  PLANNED INTERVENTIONS: [id number]- PT Re-evaluation, 97110-Therapeutic exercises, 97530- Therapeutic activity, 97112- Neuromuscular re-education, 97535- Self Care, [id number]- Manual therapy, [phone]- Gait training, [phone]- Canalith repositioning, [id number]- Aquatic Therapy, [phone]- Electrical stimulation (unattended), [phone]- Electrical stimulation (manual), [id number]- Vasopneumatic device, [id number]- Ultrasound, [id number]- Traction (mechanical), [id number]- Ionotophoresis 4mg /ml Dexamethasone, [id number] (1-2 muscles), [id number] (3+ muscles)- Dry Needling, Patient/Family education, Balance training, Stair training, Taping, Joint mobilization, Joint manipulation,  Spinal manipulation, Spinal mobilization, Vestibular training, Cryotherapy, and Moist heat  PLAN FOR NEXT SESSION:  Recert needed after 11/21: land next: progress core HEP & dry needling; core and postural strengthening; cervical melt method;   Mliss Cummins, PT 08/20/24 1:07 PM

## 2024-08-22 ENCOUNTER — Encounter: Attending: Psychology | Admitting: Psychology

## 2024-08-22 ENCOUNTER — Ambulatory Visit: Admitting: Psychology

## 2024-08-22 DIAGNOSIS — F4323 Adjustment disorder with mixed anxiety and depressed mood: Secondary | ICD-10-CM | POA: Insufficient documentation

## 2024-08-22 DIAGNOSIS — G4733 Obstructive sleep apnea (adult) (pediatric): Secondary | ICD-10-CM | POA: Insufficient documentation

## 2024-08-22 DIAGNOSIS — F5101 Primary insomnia: Secondary | ICD-10-CM | POA: Insufficient documentation

## 2024-08-23 ENCOUNTER — Ambulatory Visit (HOSPITAL_BASED_OUTPATIENT_CLINIC_OR_DEPARTMENT_OTHER): Attending: Family Medicine | Admitting: Physical Therapy

## 2024-08-23 DIAGNOSIS — M6281 Muscle weakness (generalized): Secondary | ICD-10-CM | POA: Diagnosis present

## 2024-08-23 DIAGNOSIS — R252 Cramp and spasm: Secondary | ICD-10-CM | POA: Insufficient documentation

## 2024-08-23 DIAGNOSIS — M542 Cervicalgia: Secondary | ICD-10-CM | POA: Diagnosis present

## 2024-08-23 DIAGNOSIS — M5459 Other low back pain: Secondary | ICD-10-CM | POA: Insufficient documentation

## 2024-08-23 NOTE — Therapy (Signed)
 OUTPATIENT PHYSICAL THERAPY CERVICAL/ LUMBAR TREATMENT   Patient Name: Christy Holland MRN: [id number] DOB:01/24/1971, 53 y.o., female Today's Date: 08/23/2024  END OF SESSION:  PT End of Session - 08/23/24 1614     Visit Number 9    Date for Recertification  09/13/24    Authorization Type [employer] / [employer] auth pending    Authorization Time Period 9/23-11/21/25    Authorization - Visit Number 8    Authorization - Number of Visits 8    PT Start Time 1605    PT Stop Time 1643    PT Time Calculation (min) 38 min    Activity Tolerance Patient tolerated treatment well    Behavior During Therapy [facility] for tasks assessed/performed          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/2017   Rhinitis, chronic 08/21/2017   Sinusitis 02/13/2018   Snoring 08/21/2017   Past Surgical History:  Procedure Laterality Date   PVC ABLATION N/A 06/18/2021   Procedure: PVC ABLATION;  Surgeon: Waddell Danelle ORN, MD;  Location: [facility] INVASIVE [facility];  Service: Cardiovascular;  Laterality: N/A;   Patient Active Problem List   Diagnosis Date Noted   Menorrhagia 05/01/2024   Osteoarthritis of right knee 01/26/2024   Bilateral carpal tunnel syndrome 12/27/2023   Arthralgia of right knee 11/22/2023   Low back pain 11/14/2023   Neck pain 11/14/2023   Episodic recurrent vertigo 07/31/2023   Balance disorder 07/17/2023   Dizziness 07/17/2023   Carpal tunnel syndrome on both sides 04/04/2022   Chronic right shoulder pain 04/04/2022   Cervical radiculopathy 04/04/2022   Class 2 severe obesity due to excess calories with serious comorbidity and body mass index (BMI) of 36.0 to 36.9 in adult 03/17/2022   History of cardiac radiofrequency ablation 01/18/2022   Insomnia 11/12/2021   Mass of left breast 10/19/2021   Acne vulgaris 09/28/2021   Dyspnea on exertion  04/03/2021   Holter monitor, abnormal 08/22/2019   Frequent PVCs 03/01/2019   Sinusitis 02/13/2018   Chronic maxillary sinusitis 08/21/2017   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/21/2017   Rhinitis, chronic 08/21/2017   OSA (obstructive sleep apnea) 08/21/2017    PCP: Leonce Carola PARAS, PA-C   REFERRING PROVIDER: Faye Lauraine PARAS, FNP  REFERRING DIAG:  Diagnosis  M54.12 (ICD-10-CM) - Radiculopathy, cervical region    THERAPY DIAG:  Cervicalgia  Muscle weakness (generalized)  Other low back pain  Cramp and spasm  Rationale for Evaluation and Treatment: Rehabilitation  ONSET DATE: Chronic   SUBJECTIVE:  SUBJECTIVE STATEMENT:  Pt reports good relief with DN to neck last session; improved cervical ROM.   POOL ACCESS: currently none.    From initial evaluation:  Patient presents with chronic neck pain that hurts constantly. She has done injections and nerve blocks in the past. She reports having DDD and compressed nerves. It is more challenging looking to the right than the left. She reports numbness and tingling down her arms to all of her fingers. She reports differences in her grip strength and she drops objects. Pain impacts her sleep at night. She has daily headaches that last multiple days when she is having a bad flare up.She also reports chronic back pain that interferes with her standing and sitting tolerance. She has numbness and tingling in both of her legs that goes all the way down to her feet.  Hand dominance: Right  PERTINENT HISTORY:  Hx back neck and right shoulder pain; OA of right knee  PAIN:  Are you having pain? Yes: NPRS scale: 6/ 10 Pain location: bilateral cervical spine starting at base of head; lower back at beltline Pain description: achy;  sharp Aggravating factors: cervical rotation (Rt > Lt); driving; standing> 5 mins; sitting > 5-10 mins; walking> 5-49mins Relieving factors: Ice/ Heat; stretching    PRECAUTIONS: None  RED FLAGS: None     WEIGHT BEARING RESTRICTIONS: No  FALLS:  Has patient fallen in last 6 months? 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  DIAGNOSTIC FINDINGS:  Cervical Spine MR 12/29/2013 IMPRESSION:  1. Cervical spondylosis and degenerative disc disease, causing  moderate impingement at C5-6 and C6-7 as detailed above.   PATIENT SURVEYS:  EVAL NDI: 34/50 68% 08/20/24  34 / 50 = 68.0 %  Minimum Detectable Change (90% confidence): 5 points or 10% points  EVAL ODI: 35/50 70% 08/20/24 35 / 50 = 70.0 %  COGNITION: Overall cognitive status: Within functional limits for tasks assessed  SENSATION: Numbness and tingling in UE and LEs 0/4 on light touch sensation bilateral but able to feel deeper pressure sensation  POSTURE: rounded shoulders and forward head   CERVICAL ROM: * pain  Active ROM A/PROM (deg) eval AROM  08/08/24 AROM  08/20/24  Flexion 22*  15  Extension 25*  22  Right lateral flexion 20*  12  Left lateral flexion 12*  15  Right rotation 30* 40   Left rotatio/n 30* 42    (Blank row/s = not tested)    UPPER  EXTREMITY MMT: Grossly 4-/5 pain with shoulder abduction & flexion testing Grip strength to be  tested    LOWER  EXTREMITY FFU:Hmnddob 4- to 4 /5 bilateral  (very weak hip flexors, abductors)   FUNCTIONAL TESTS:  5 times sit to stand: 23.40sec hands on knees  TREATMENT DATE:  08/23/24:Pt seen for aquatic therapy today.  Treatment took place in water 3.5-4.75 ft in depth at the [facility] pool. Temp of water was 91.  Pt entered/exited the pool via stairs independently with bil rail.  -  75% water depth water walking: forward/back 3 laps with running arms cues for posture, sidestepping with arm add/ abdct 2 laps - bow and arrow with same side step back x12 - side stepping with arm add/abdct with yellow hand floats - suitcase carry with bil and unilateral yellow hand floats under water at side, walking forward/ backward  - UE on wall:  hip abdct/ add 2 x 12 - open book x 5-6 each side -Bicycle in corner holding wall 5 min in horseback on yellow noodle  [facility] Adult PT Treatment:                                              08/20/2024  Reauth completed NDI ODI Checked goals/ROM Standing chin tucks x 5 - cues to avoid protraction Standing shoulder row & extension with red TB x 20 ea cues for TA  Trigger Point Dry Needling  Subsequent Treatment: Instructions provided previously at initial dry needling treatment.   Patient Verbal Consent Given: Yes Education Handout Provided: Previously Provided Muscles Treated: B suboccipitals, cervical multifidi, UT, gluteals  Electrical Stimulation Performed: No Treatment Response/Outcome: Utilized skilled palpation to identify bony landmarks and trigger points.  Able to illicit twitch response and muscle elongation.  Soft tissue mobilization to muscles needled to further promote tissue elongation and decreased pain.       [facility] Adult PT Treatment:                                              08/23/24: Pt seen for aquatic therapy today.  Treatment took place in water 3.5-4.75 ft in depth at the [facility] pool. Temp of  water was 91.  Pt entered/exited the pool via stairs independently with bil rail.  -75% water depth water walking: forward/back 3 laps with running arms, sidestepping with arm add/ abdct 2 laps - side stepping with arm add/abdct with rainbow hand floats -> yellow hand floats - suitcase carry with bil and unilateral yellow hand floats under water at side, walking forward/ backward  - UE on yellow hand floats:  3 way LE kick x 5 each side -High knee march with reciprocal row with yellow hand floats - bow and arrow with same side step back 2 x 5 - staggered stance with tricep press down with yellow hand floats - TrA set with rainbow float pull down in wide stance x 10 -Bicycle in corner holding wall 5 min in horseback on yellow noodle  [facility] Adult PT Treatment:                                              08/05/2024  NuStep Level 6 6 mins- PT present to discuss status 3way stability ball stretch x 8 each direction   Trigger Point Dry Needling  Subsequent Treatment: Instructions provided previously at initial dry needling treatment.   Patient Verbal Consent Given: Yes Education Handout Provided: Previously Provided Muscles Treated: bilateral lumbar multifidi; bilateral upper traps ; bilateral cervical suboccipitals  Electrical Stimulation Performed: No Treatment Response/Outcome: Utilized skilled palpation to identify trigger points.  During dry needling able to palpate muscle twitch  and muscle elongation  Soft tissue mobilization performed to further promote tissue elongation and decreased pain.    Standing shoulder row & extension with red TB (added to HEP)  08/02/24: Pt seen for aquatic therapy today.  Treatment took place in water 3.5-4.75 ft in depth at the [facility] pool. Temp of water was 91.  Pt entered/exited the pool via stairs independently with bil rail. -75% water depth water walking: forward/back 6 lengths with running arms, sidestepping with short hollow noodle  shld abd/add 6 lengths -High knee march 4 lengths holding rainbow floats by her side. - 1 yellow float by the right side walk 4 length, then switch hands 4 lengths -Hip 3 ways 15x each Bil light touch on wall for balance -Neutral stance then staggered with shld horz add/abd 10x in each position. -Sit to stand from 3rd step 10x: VC to drive feet into the floor as she stood up -Bicycle in corner holding wall 5 min in horseback on yellow noodle.  07/25/2024 NuStep Level 5 5 mins- PT present to discuss status Seated scap retraction x 12 Seated cervical retraction x 12 3way stability ball stretch x 8 each direction  Sidelying open books x 8 Hooklying TA activation with red stability ball x 15  Trigger Point Dry Needling  Initial Treatment: Pt instructed on Dry Needling rational, procedures, and possible side effects. Pt instructed to expect mild to moderate muscle soreness later in the day and/or into the next day.  Pt instructed in methods to reduce muscle soreness. Pt instructed to continue prescribed HEP. Patient was educated on signs and symptoms of infection and other risk factors and advised to seek medical attention should they occur.  Patient verbalized understanding of these instructions and education.   Patient Verbal Consent Given: Yes Education Handout Provided: Previously Provided Muscles Treated: bilateral lumbar multifidi; bilateral gluteals  Electrical Stimulation Performed: No Treatment Response/Outcome: Utilized skilled palpation to identify trigger points.  During dry needling able to palpate muscle twitch and muscle elongation    Single knee to chest x 30 sec bilateral  Supine glute stretch x 30 sec bilateral      PATIENT EDUCATION:  Education details: intro to aquatic therapy  Person educated: Patient Education method: Explanation, Demonstration Education comprehension: verbalized understanding, returned demonstration, and needs further education  HOME  EXERCISE PROGRAM: Access Code: [email or web address] URL: [email or web address] Date: 08/05/2024 Prepared by: Kristeen Sar  Exercises - Seated Scapular Retraction  - 1 x daily - 7 x weekly - 1 sets - 10 reps - Seated Cervical Retraction  - 1 x daily - 7 x weekly - 1 sets - 10 reps - Sidelying Thoracic Rotation with Open Book  - 1 x daily - 7 x weekly - 1 sets - 10 reps - Supine Single Knee to Chest Stretch  - 1 x daily - 7 x weekly - 2 sets - 20-30 hold - Supine Gluteus Stretch  - 1 x daily - 7 x weekly - 2 sets - 20-30 hold - Standing Shoulder Row with Anchored Resistance  - 1 x daily - 7 x weekly - 2 sets - 10 reps - Shoulder extension with resistance - Neutral  - 1 x daily - 7 x weekly - 2 sets - 10 reps  ASSESSMENT:  CLINICAL IMPRESSION:   Pt observed with improved posture when exercising in pool. She requires less cues for head position and posture.  No increase in pain during session; gradual reduction of lower back pain with suspended cycling.  She would benefit from continued skilled therapy to address impairments.  Will encourage her to join local pool for continued relief and management of chronic pain at d/c. Issued list of area pools today.  Pt is progressing gradually towards remaining goals.   From initial evaluation:  Patient is a 53 y.o. female who was seen today for physical therapy evaluation and treatment for neck and back pain. Darshana presents with chronic neck and back pain there interferes with her functional mobility and ability to perform work duties. Her cervical rotation is limited and painful and affects her ability to look in her blind spots. She reports having daily headaches that sometimes last multiple days if she is having a bad flare up. She reports numbness and tingling in her upper and lower extremities that also affects her grip strength. Based on evaluation noted poor posture, muscle weakness, and decreased ROM. When testing light touch sensation patient  did not feel any points on her thigh and lower leg bilaterally . She felt deep pressure sensation. Educated patient to return to her MD to get a nerve conduction study to see if she has neuropathy. She said she was going to get tested for it in the past, but never got it done. She understood the safety aspect involved of not having full sensation in her lower extremities. Patient will benefit from skilled PT to address the below impairments and improve overall function.   OBJECTIVE IMPAIRMENTS: decreased endurance, decreased mobility, difficulty walking, decreased ROM, decreased strength, hypomobility, increased fascial restrictions, increased muscle spasms, impaired flexibility, postural dysfunction, and pain.   ACTIVITY LIMITATIONS: carrying, lifting, bending, sitting, standing, squatting, sleeping, stairs, transfers, dressing, reach over head, and hygiene/grooming  PARTICIPATION LIMITATIONS: meal prep, cleaning, laundry, driving, shopping, community activity, and occupation  PERSONAL FACTORS: Age, Fitness, Time since onset of injury/illness/exacerbation, and 1-2 comorbidities: OA are also affecting patient's functional outcome.   REHAB POTENTIAL: Good  CLINICAL DECISION MAKING: Evolving/moderate complexity  EVALUATION COMPLEXITY: Moderate   GOALS: Goals reviewed with patient? Yes  SHORT TERM GOALS: Target date: 08/13/2024  Patient will be independent with initial HEP. Baseline:  Goal status: MET 08/20/24  2.  Patient will report > or = to 30% improvement in sleep quality since starting PT. Baseline: disturbed for 3-4 hours Goal status: in progress - (no change) -08/20/24  3.  Patient will demonstrate correct seated and standing posture with no verbal cues to decrease mechanical strain. Baseline:  Goal status: MET 08/20/24  4.  Patient will be able to stand for at least 10 mins with < or = to 5/10 back pain for performance of ADLs and house chores.  Baseline: 5 mins Goal  status: IN PROGRESS 08/20/24 average pain 6/10 in back can stand 5-10 min before needing to sit.    LONG TERM GOALS: Target date: 09/13/2024  Patient will demonstrate independence in advanced HEP. Baseline:  Goal status: IN PROGRESS  2.  Patient will report > or = to 60% improvement in neck and back pain since starting PT. Baseline:  Goal status: IN PROGRESS  3.  Patient will complete 5STS in < or= to 19 sec due to improved muscle strength and to be closer to age predicted norms. Baseline: 23.40 sec Goal status: INITIAL   4.  Patient to score < or = to 29/50 on NDI due to improved function and decreased disability.  Baseline: 34/50  Goal status: IN PROGRESS  5.  Patient to score < or = to 25 on ODI due to  decreased self perceived disability.  Baseline: 35/50 Goal status: IN PROGRESS  6.  Patient will demonstrate improved cervical ROM by 5-8 degrees to be able to look in her blind spot while driving. Baseline: see chart  Goal status: In progress - 08/20/24   PLAN:  PT FREQUENCY: 2x/week  PT DURATION: 8 weeks  PLANNED INTERVENTIONS: [id number]- PT Re-evaluation, 97110-Therapeutic exercises, 97530- Therapeutic activity, 97112- Neuromuscular re-education, 97535- Self Care, [id number]- Manual therapy, [phone]- Gait training, [phone]- Canalith repositioning, [phone]- Aquatic Therapy, [phone]- Electrical stimulation (unattended), [phone]- Electrical stimulation (manual), [id number]- Vasopneumatic device, [id number]- Ultrasound, [id number]- Traction (mechanical), [id number]- Ionotophoresis 4mg /ml Dexamethasone, [id number] (1-2 muscles), [id number] (3+ muscles)- Dry Needling, Patient/Family education, Balance training, Stair training, Taping, Joint mobilization, Joint manipulation, Spinal manipulation, Spinal mobilization, Vestibular training, Cryotherapy, and Moist heat  PLAN FOR NEXT SESSION:  Recert needed after 11/21: land next: progress core HEP & dry needling; core and postural strengthening; cervical melt method;   Delon Aquas, PTA 08/23/24 5:01 PM [facility] [address] [place], [place], [address] Phone: [phone]   Fax:  [phone]

## 2024-08-26 ENCOUNTER — Ambulatory Visit: Attending: Family Medicine | Admitting: Physical Therapy

## 2024-08-26 ENCOUNTER — Ambulatory Visit: Admitting: Dermatology

## 2024-08-26 ENCOUNTER — Encounter: Payer: Self-pay | Admitting: Physical Therapy

## 2024-08-26 DIAGNOSIS — R252 Cramp and spasm: Secondary | ICD-10-CM | POA: Diagnosis present

## 2024-08-26 DIAGNOSIS — M542 Cervicalgia: Secondary | ICD-10-CM | POA: Diagnosis present

## 2024-08-26 DIAGNOSIS — M6281 Muscle weakness (generalized): Secondary | ICD-10-CM | POA: Insufficient documentation

## 2024-08-26 DIAGNOSIS — M5459 Other low back pain: Secondary | ICD-10-CM | POA: Diagnosis present

## 2024-08-26 NOTE — Therapy (Signed)
 OUTPATIENT PHYSICAL THERAPY CERVICAL/ LUMBAR TREATMENT   Patient Name: Christy Holland MRN: [id number] DOB:1971-07-28, 53 y.o., female Today's Date: 08/26/2024  END OF SESSION:  PT End of Session - 08/26/24 1213     Visit Number 10    Date for Recertification  09/13/24    Authorization Type Sherleen /CARELON APPROVED 4 VL 08/26/2024-09/24/2024-AUTH#[id number]    Authorization Time Period 08/26/2024-09/24/2024    Authorization - Visit Number 1    Authorization - Number of Visits 4    PT Start Time 1114    PT Stop Time 1205    PT Time Calculation (min) 51 min    Activity Tolerance Patient tolerated treatment well    Behavior During Therapy [facility] for tasks assessed/performed           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/2017   Rhinitis, chronic 08/21/2017   Sinusitis 02/13/2018   Snoring 08/21/2017   Past Surgical History:  Procedure Laterality Date   PVC ABLATION N/A 06/18/2021   Procedure: PVC ABLATION;  Surgeon: Waddell Danelle ORN, MD;  Location: [facility] INVASIVE [facility];  Service: Cardiovascular;  Laterality: N/A;   Patient Active Problem List   Diagnosis Date Noted   Menorrhagia 05/01/2024   Osteoarthritis of right knee 01/26/2024   Bilateral carpal tunnel syndrome 12/27/2023   Arthralgia of right knee 11/22/2023   Low back pain 11/14/2023   Neck pain 11/14/2023   Episodic recurrent vertigo 07/31/2023   Balance disorder 07/17/2023   Dizziness 07/17/2023   Carpal tunnel syndrome on both sides 04/04/2022   Chronic right shoulder pain 04/04/2022   Cervical radiculopathy 04/04/2022   Class 2 severe obesity due to excess calories with serious comorbidity and body mass index (BMI) of 36.0 to 36.9 in adult 03/17/2022   History of cardiac radiofrequency ablation 01/18/2022   Insomnia 11/12/2021   Mass of left breast 10/19/2021   Acne vulgaris  09/28/2021   Dyspnea on exertion 04/03/2021   Holter monitor, abnormal 08/22/2019   Frequent PVCs 03/01/2019   Sinusitis 02/13/2018   Chronic maxillary sinusitis 08/21/2017   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/21/2017   Rhinitis, chronic 08/21/2017   OSA (obstructive sleep apnea) 08/21/2017    PCP: Leonce Carola PARAS, PA-C   REFERRING PROVIDER: Faye Lauraine PARAS, FNP  REFERRING DIAG:  Diagnosis  M54.12 (ICD-10-CM) - Radiculopathy, cervical region    THERAPY DIAG:  Cervicalgia  Muscle weakness (generalized)  Other low back pain  Cramp and spasm  Rationale for Evaluation and Treatment: Rehabilitation  ONSET DATE: Chronic   SUBJECTIVE:  SUBJECTIVE STATEMENT:  Patient presents with increased tingling in her left hand today. Pain is 6-7/10.   POOL ACCESS: currently none.    From initial evaluation:  Patient presents with chronic neck pain that hurts constantly. She has done injections and nerve blocks in the past. She reports having DDD and compressed nerves. It is more challenging looking to the right than the left. She reports numbness and tingling down her arms to all of her fingers. She reports differences in her grip strength and she drops objects. Pain impacts her sleep at night. She has daily headaches that last multiple days when she is having a bad flare up.She also reports chronic back pain that interferes with her standing and sitting tolerance. She has numbness and tingling in both of her legs that goes all the way down to her feet.  Hand dominance: Right  PERTINENT HISTORY:  Hx back neck and right shoulder pain; OA of right knee  PAIN:  Are you having pain? Yes: NPRS scale: 6/ 10 Pain location: bilateral cervical spine starting at base of head; lower back at  beltline Pain description: achy; sharp Aggravating factors: cervical rotation (Rt > Lt); driving; standing> 5 mins; sitting > 5-10 mins; walking> 5-30mins Relieving factors: Ice/ Heat; stretching    PRECAUTIONS: None  RED FLAGS: None     WEIGHT BEARING RESTRICTIONS: No  FALLS:  Has patient fallen in last 6 months? 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  DIAGNOSTIC FINDINGS:  Cervical Spine MR 12/29/2013 IMPRESSION:  1. Cervical spondylosis and degenerative disc disease, causing  moderate impingement at C5-6 and C6-7 as detailed above.   PATIENT SURVEYS:  EVAL NDI: 34/50 68% 08/20/24  34 / 50 = 68.0 %  Minimum Detectable Change (90% confidence): 5 points or 10% points  EVAL ODI: 35/50 70% 08/20/24 35 / 50 = 70.0 %  COGNITION: Overall cognitive status: Within functional limits for tasks assessed  SENSATION: Numbness and tingling in UE and LEs 0/4 on light touch sensation bilateral but able to feel deeper pressure sensation  POSTURE: rounded shoulders and forward head   CERVICAL ROM: * pain  Active ROM A/PROM (deg) eval AROM  08/08/24 AROM  08/20/24  Flexion 22*  15  Extension 25*  22  Right lateral flexion 20*  12  Left lateral flexion 12*  15  Right rotation 30* 40   Left rotatio/n 30* 42    (Blank row/s = not tested)    UPPER  EXTREMITY MMT: Grossly 4-/5 pain with shoulder abduction & flexion  testing Grip strength to be tested    LOWER  EXTREMITY FFU:Hmnddob 4- to 4 /5 bilateral  (very weak hip flexors, abductors)   FUNCTIONAL TESTS:  5 times sit to stand: 23.40sec hands on knees  TREATMENT DATE:  08/26/2024 Updated on status Hooklying shoulder abduction with red TB 2 x 10 Hooklying D2 flexion with red TB 2 x 10 bilateral  Cervical MELT method (flexion/extension & rotation) x 20 each  Hooklying TA activation +  ball squeeze 2 x 10 TA activation + march x 10 bilateral  Standing shoulder extension + TA activation with green TB 2 x 10 Cervical Mechanical Traction 17 lbs of force x 15 mins     08/23/24:Pt seen for aquatic therapy today.  Treatment took place in water 3.5-4.75 ft in depth at the [facility] pool. Temp of water was 91.  Pt entered/exited the pool via stairs independently with bil rail.  -75% water depth water walking: forward/back 3 laps with running arms cues for posture, sidestepping with arm add/ abdct 2 laps - bow and arrow with same side step back x12 - side stepping with arm add/abdct with yellow hand floats - suitcase carry with bil and unilateral yellow hand floats under water at side, walking forward/ backward  - UE on wall:  hip abdct/ add 2 x 12 - open book x 5-6 each side -Bicycle in corner holding wall 5 min in horseback on yellow noodle  [facility] Adult PT Treatment:                                              08/20/2024  Reauth completed NDI ODI Checked goals/ROM Standing chin tucks x 5 - cues to avoid protraction Standing shoulder row & extension with red TB x 20 ea cues for TA  Trigger Point Dry Needling  Subsequent Treatment: Instructions provided previously at initial dry needling treatment.   Patient Verbal Consent Given: Yes Education Handout Provided: Previously Provided Muscles Treated: B suboccipitals, cervical multifidi, UT, gluteals  Electrical Stimulation Performed: No Treatment Response/Outcome: Utilized skilled  palpation to identify bony landmarks and trigger points.  Able to illicit twitch response and muscle elongation.  Soft tissue mobilization to muscles needled to further promote tissue elongation and decreased pain.       [facility] Adult PT Treatment:                                              08/23/24: Pt seen for aquatic therapy today.  Treatment took place in water 3.5-4.75 ft in depth at the [facility] pool. Temp of water was 91.  Pt entered/exited the pool via stairs independently with bil rail.  -75% water depth water walking: forward/back 3 laps with running arms, sidestepping with arm add/ abdct 2 laps - side stepping with arm add/abdct with rainbow hand floats -> yellow hand floats - suitcase carry with bil and unilateral yellow hand floats under water at side, walking forward/ backward  - UE on yellow hand floats:  3 way LE kick x 5 each side -High knee march with reciprocal row with yellow hand floats - bow and arrow with same side step back 2 x 5 - staggered stance with tricep press down with yellow hand floats - TrA set with rainbow float pull down in wide stance x 10 -Bicycle in corner holding wall 5 min in horseback on yellow noodle    PATIENT EDUCATION:  Education details: intro to aquatic therapy  Person educated: Patient Education method: Programmer, Multimedia, Demonstration Education comprehension: verbalized understanding, returned demonstration, and needs further education  HOME EXERCISE PROGRAM: Access Code: [email or web address] URL: [email or web address] Date: 08/05/2024 Prepared by: Kristeen Sar  Exercises - Seated Scapular Retraction  - 1 x daily -  7 x weekly - 1 sets - 10 reps - Seated Cervical Retraction  - 1 x daily - 7 x weekly - 1 sets - 10 reps - Sidelying Thoracic Rotation with Open Book  - 1 x daily - 7 x weekly - 1 sets - 10 reps - Supine Single Knee to Chest Stretch  - 1 x daily - 7 x weekly - 2 sets - 20-30 hold - Supine Gluteus Stretch  - 1 x  daily - 7 x weekly - 2 sets - 20-30 hold - Standing Shoulder Row with Anchored Resistance  - 1 x daily - 7 x weekly - 2 sets - 10 reps - Shoulder extension with resistance - Neutral  - 1 x daily - 7 x weekly - 2 sets - 10 reps  ASSESSMENT:  CLINICAL IMPRESSION:  Patient presents with increased tingling sensation in her left hand that began over the weekend. Initiated a trial of mechanical traction for decompression of cervical spine. Treatment session focused on postural strengthening and progressing core exercises. Patient required cues for correct breathing technique while performing exercises. She is making slow progress towards goals, but would benefit from continued therapy to decrease pain and improve function.  From initial evaluation:  Patient is a 53 y.o. female who was seen today for physical therapy evaluation and treatment for neck and back pain. Waldine presents with chronic neck and back pain there interferes with her functional mobility and ability to perform work duties. Her cervical rotation is limited and painful and affects her ability to look in her blind spots. She reports having daily headaches that sometimes last multiple days if she is having a bad flare up. She reports numbness and tingling in her upper and lower extremities that also affects her grip strength. Based on evaluation noted poor posture, muscle weakness, and decreased ROM. When testing light touch sensation patient did not feel any points on her thigh and lower leg bilaterally . She felt deep pressure sensation. Educated patient to return to her MD to get a nerve conduction study to see if she has neuropathy. She said she was going to get tested for it in the past, but never got it done. She understood the safety aspect involved of not having full sensation in her lower extremities. Patient will benefit from skilled PT to address the below impairments and improve overall function.   OBJECTIVE IMPAIRMENTS: decreased  endurance, decreased mobility, difficulty walking, decreased ROM, decreased strength, hypomobility, increased fascial restrictions, increased muscle spasms, impaired flexibility, postural dysfunction, and pain.   ACTIVITY LIMITATIONS: carrying, lifting, bending, sitting, standing, squatting, sleeping, stairs, transfers, dressing, reach over head, and hygiene/grooming  PARTICIPATION LIMITATIONS: meal prep, cleaning, laundry, driving, shopping, community activity, and occupation  PERSONAL FACTORS: Age, Fitness, Time since onset of injury/illness/exacerbation, and 1-2 comorbidities: OA are also affecting patient's functional outcome.   REHAB POTENTIAL: Good  CLINICAL DECISION MAKING: Evolving/moderate complexity  EVALUATION COMPLEXITY: Moderate   GOALS: Goals reviewed with patient? Yes  SHORT TERM GOALS: Target date: 08/13/2024  Patient will be independent with initial HEP. Baseline:  Goal status: MET 08/20/24  2.  Patient will report > or = to 30% improvement in sleep quality since starting PT. Baseline: disturbed for 3-4 hours Goal status: in progress - (no change) -08/20/24  3.  Patient will demonstrate correct seated and standing posture with no verbal cues to decrease mechanical strain. Baseline:  Goal status: MET 08/20/24  4.  Patient will be able to stand for at least 10  mins with < or = to 5/10 back pain for performance of ADLs and house chores.  Baseline: 5 mins Goal status: IN PROGRESS 08/20/24 average pain 6/10 in back can stand 5-10 min before needing to sit.    LONG TERM GOALS: Target date: 09/13/2024  Patient will demonstrate independence in advanced HEP. Baseline:  Goal status: IN PROGRESS  2.  Patient will report > or = to 60% improvement in neck and back pain since starting PT. Baseline:  Goal status: IN PROGRESS  3.  Patient will complete 5STS in < or= to 19 sec due to improved muscle strength and to be closer to age predicted norms. Baseline: 23.40  sec Goal status: INITIAL   4.  Patient to score < or = to 29/50 on NDI due to improved function and decreased disability.  Baseline: 34/50  Goal status: IN PROGRESS  5.  Patient to score < or = to 25 on ODI due to decreased self perceived disability.  Baseline: 35/50 Goal status: IN PROGRESS  6.  Patient will demonstrate improved cervical ROM by 5-8 degrees to be able to look in her blind spot while driving. Baseline: see chart  Goal status: In progress - 08/20/24   PLAN:  PT FREQUENCY: 2x/week  PT DURATION: 8 weeks  PLANNED INTERVENTIONS: 97164- PT Re-evaluation, 97110-Therapeutic exercises, 97530- Therapeutic activity, 97112- Neuromuscular re-education, 97535- Self Care, 02859- Manual therapy, [phone]- Gait training, [phone]- Canalith repositioning, [id number]- Aquatic Therapy, [phone]- Electrical stimulation (unattended), [phone]- Electrical stimulation (manual), [id number]- Vasopneumatic device, [id number]- Ultrasound, [id number]- Traction (mechanical), [id number]- Ionotophoresis 4mg /ml Dexamethasone, [id number] (1-2 muscles), 20561 (3+ muscles)- Dry Needling, Patient/Family education, Balance training, Stair training, Taping, Joint mobilization, Joint manipulation, Spinal manipulation, Spinal mobilization, Vestibular training, Cryotherapy, and Moist heat  PLAN FOR NEXT SESSION:  assess response to cervical traction; Recert needed after 11/21: land next: progress core HEP & dry needling; core and postural strengthening; cervical melt method;   Kristeen Sar, PT, DPT 08/26/24 12:16 PM

## 2024-08-28 ENCOUNTER — Encounter: Payer: Self-pay | Admitting: Dermatology

## 2024-08-28 ENCOUNTER — Ambulatory Visit: Admitting: Dermatology

## 2024-08-28 DIAGNOSIS — L810 Postinflammatory hyperpigmentation: Secondary | ICD-10-CM

## 2024-08-28 DIAGNOSIS — L700 Acne vulgaris: Secondary | ICD-10-CM | POA: Diagnosis not present

## 2024-08-28 NOTE — Patient Instructions (Addendum)
 VISIT SUMMARY:  During your visit, we discussed your ongoing reflux symptoms and skin concerns. We reviewed your current treatments and made some adjustments to your skincare regimen to help improve your condition.  YOUR PLAN:  -ACNE VULGARIS:  Acne vulgaris is a common skin condition that causes pimples and dark spots.   Your skin has shown improvement since June with lighter pigmentation and fewer bumps. Continue using tretinoin  0.025% three nights a week, but adjust to two nights if your skin becomes dry or tight.   We are introducing Effaclar Duo M (0.5% salicylic acid serum) for morning use to help exfoliate and unclog pores. Use Avene Tolerance moisturizer for hydration and Ciclophate moisturizer at night after applying tretinoin .  In  May, if your skin tolerates it, we will increase tretinoin  to 0.05% for better results. You may also consider using Rock retinol eye cream for the under-eye and neck area to prevent wrinkles.  INSTRUCTIONS:  Please continue with your current treatments and follow the new skincare regimen as discussed. If you experience any issues or have concerns, do not hesitate to contact our office. We will plan to increase your tretinoin  dosage in May if your skin is tolerating the current treatment well.     Important Information  Due to recent changes in healthcare laws, you may see results of your pathology and/or laboratory studies on MyChart before the doctors have had a chance to review them. We understand that in some cases there may be results that are confusing or concerning to you. Please understand that not all results are received at the same time and often the doctors may need to interpret multiple results in order to provide you with the best plan of care or course of treatment. Therefore, we ask that you please give us  2 business days to thoroughly review all your results before contacting the office for clarification. Should we see a critical lab  result, you will be contacted sooner.   If You Need Anything After Your Visit  If you have any questions or concerns for your doctor, please call our main line at [phone] If no one answers, please leave a voicemail as directed and we will return your call as soon as possible. Messages left after 4 pm will be answered the following business day.   You may also send us  a message via MyChart. We typically respond to MyChart messages within 1-2 business days.  For prescription refills, please ask your pharmacy to contact our office. Our fax number is [phone].  If you have an urgent issue when the clinic is closed that cannot wait until the next business day, you can page your doctor at the number below.    Please note that while we do our best to be available for urgent issues outside of office hours, we are not available 24/7.   If you have an urgent issue and are unable to reach us , you may choose to seek medical care at your doctor's office, retail clinic, urgent care center, or emergency room.  If you have a medical emergency, please immediately call 911 or go to the emergency department. In the event of inclement weather, please call our main line at [phone] for an update on the status of any delays or closures.  Dermatology Medication Tips: Please keep the boxes that topical medications come in in order to help keep track of the instructions about where and how to use these. Pharmacies typically print the medication instructions only on the  boxes and not directly on the medication tubes.   If your medication is too expensive, please contact our office at [phone] or send us  a message through MyChart.   We are unable to tell what your co-pay for medications will be in advance as this is different depending on your insurance coverage. However, we may be able to find a substitute medication at lower cost or fill out paperwork to get insurance to cover a needed medication.    If a prior authorization is required to get your medication covered by your insurance company, please allow us  1-2 business days to complete this process.  Drug prices often vary depending on where the prescription is filled and some pharmacies may offer cheaper prices.  The website www.goodrx.com contains coupons for medications through different pharmacies. The prices here do not account for what the cost may be with help from insurance (it may be cheaper with your insurance), but the website can give you the price if you did not use any insurance.  - You can print the associated coupon and take it with your prescription to the pharmacy.  - You may also stop by our office during regular business hours and pick up a GoodRx coupon card.  - If you need your prescription sent electronically to a different pharmacy, notify our office through [facility] or by phone at [phone]

## 2024-08-28 NOTE — Progress Notes (Signed)
   Follow-Up Visit   Subjective  Christy Holland is a 53 y.o. female who presents for the following: Acne Vulgaris  Patient (and/or pt guardian) consented to the use of AI-assisted tools for note generation.   Christy Holland is here for a follow up on acne and PIH. Her last visit was on 05/01/24. She was prescribed Tretinoin  0.05% and was to apply two nights a week and increase to three nights weekly after a month of use. It was recommended to use Eucerin Radiant Tone two times daily and continue Cetaphil face wash. She is tolerating this regime well at this time.   The following portions of the chart were reviewed this encounter and updated as appropriate: medications, allergies, medical history  Review of Systems:  No other skin or systemic complaints except as noted in HPI or Assessment and Plan.  Objective  Well appearing patient in no apparent distress; mood and affect are within normal limits.  Areas Examined: Face, chest and back  Relevant exam findings are noted in the Assessment and Plan.   Assessment & Plan   Acne vulgaris and PIH Improvement in skin condition since June with lighter pigmentation and fading comedones. Current regimen includes tretinoin  0.025% three nights a week. No pustules present, so clindamycin is not indicated. Introduction of salicylic acid serum for morning use to exfoliate and unclog pores without over-drying. Discussion of potential need to adjust tretinoin  frequency based on weather and skin tolerance. Future plan to increase tretinoin  to 0.05% in May for enhanced efficacy.  - Continue tretinoin  0.025% three nights a week, adjust to two nights a week if skin becomes dry or tight. - Introduce Effaclar Duo M (0.5% salicylic acid serum) for morning use. - Use Avene Tolerance moisturizer for hydration. - Use Ciclophate moisturizer at night after tretinoin  application. - Will increase tretinoin  to 0.05% in May if skin tolerates. - Consider Rock retinol eye  cream for under-eye and neck area to prevent wrinkles.     Return in about 6 months (around 02/25/2025) for acne/pih.  I, Gordan Beams, CMA, am acting as scribe for [employer], DO.   Documentation: I have reviewed the above documentation for accuracy and completeness, and I agree with the above.  Delon Lenis, DO

## 2024-08-29 ENCOUNTER — Encounter (HOSPITAL_BASED_OUTPATIENT_CLINIC_OR_DEPARTMENT_OTHER): Payer: Self-pay | Admitting: Physical Therapy

## 2024-08-29 ENCOUNTER — Ambulatory Visit (HOSPITAL_BASED_OUTPATIENT_CLINIC_OR_DEPARTMENT_OTHER): Attending: Family Medicine | Admitting: Physical Therapy

## 2024-08-29 DIAGNOSIS — M6281 Muscle weakness (generalized): Secondary | ICD-10-CM | POA: Diagnosis present

## 2024-08-29 DIAGNOSIS — R252 Cramp and spasm: Secondary | ICD-10-CM | POA: Diagnosis present

## 2024-08-29 DIAGNOSIS — M5459 Other low back pain: Secondary | ICD-10-CM | POA: Insufficient documentation

## 2024-08-29 DIAGNOSIS — M542 Cervicalgia: Secondary | ICD-10-CM | POA: Insufficient documentation

## 2024-08-29 NOTE — Therapy (Signed)
 OUTPATIENT PHYSICAL THERAPY CERVICAL/ LUMBAR TREATMENT   Patient Name: Christy Holland MRN: [id number] DOB:October 26, 1970, 53 y.o., female Today's Date: 08/29/2024  END OF SESSION:  PT End of Session - 08/29/24 1552     Visit Number 11    Date for Recertification  09/13/24    Authorization Type Sherleen /CARELON APPROVED 4 VL 08/26/2024-09/24/2024-AUTH#[id number]    Authorization Time Period 08/26/2024-09/24/2024    Authorization - Visit Number 2    Authorization - Number of Visits 4    PT Start Time 1535    PT Stop Time 1615    PT Time Calculation (min) 40 min    Activity Tolerance Patient tolerated treatment well    Behavior During Therapy [facility] for tasks assessed/performed           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/2017   Rhinitis, chronic 08/21/2017   Sinusitis 02/13/2018   Snoring 08/21/2017   Past Surgical History:  Procedure Laterality Date   PVC ABLATION N/A 06/18/2021   Procedure: PVC ABLATION;  Surgeon: Waddell Danelle ORN, MD;  Location: [facility];  Service: Cardiovascular;  Laterality: N/A;   Patient Active Problem List   Diagnosis Date Noted   Menorrhagia 05/01/2024   Osteoarthritis of right knee 01/26/2024   Bilateral carpal tunnel syndrome 12/27/2023   Arthralgia of right knee 11/22/2023   Low back pain 11/14/2023   Neck pain 11/14/2023   Episodic recurrent vertigo 07/31/2023   Balance disorder 07/17/2023   Dizziness 07/17/2023   Carpal tunnel syndrome on both sides 04/04/2022   Chronic right shoulder pain 04/04/2022   Cervical radiculopathy 04/04/2022   Class 2 severe obesity due to excess calories with serious comorbidity and body mass index (BMI) of 36.0 to 36.9 in adult 03/17/2022   History of cardiac radiofrequency ablation 01/18/2022   Insomnia 11/12/2021   Mass of left breast 10/19/2021   Acne vulgaris  09/28/2021   Dyspnea on exertion 04/03/2021   Holter monitor, abnormal 08/22/2019   Frequent PVCs 03/01/2019   Sinusitis 02/13/2018   Chronic maxillary sinusitis 08/21/2017   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/21/2017   Rhinitis, chronic 08/21/2017   OSA (obstructive sleep apnea) 08/21/2017    PCP: Leonce Carola PARAS, PA-C   REFERRING PROVIDER: Faye Lauraine PARAS, FNP  REFERRING DIAG:  Diagnosis  M54.12 (ICD-10-CM) - Radiculopathy, cervical region    THERAPY DIAG:  Cervicalgia  Muscle weakness (generalized)  Other low back pain  Cramp and spasm  Rationale for Evaluation and Treatment: Rehabilitation  ONSET DATE: Chronic   SUBJECTIVE:  SUBJECTIVE STATEMENT:  Pt reported good response to cervical traction; tingles in Lt hand slightly less.  She reports pain is pretty good today at baseline of 6/10.    POOL ACCESS: currently none.    From initial evaluation:  Patient presents with chronic neck pain that hurts constantly. She has done injections and nerve blocks in the past. She reports having DDD and compressed nerves. It is more challenging looking to the right than the left. She reports numbness and tingling down her arms to all of her fingers. She reports differences in her grip strength and she drops objects. Pain impacts her sleep at night. She has daily headaches that last multiple days when she is having a bad flare up.She also reports chronic back pain that interferes with her standing and sitting tolerance. She has numbness and tingling in both of her legs that goes all the way down to her feet.  Hand dominance: Right  PERTINENT HISTORY:  Hx back neck and right shoulder pain; OA of right knee  PAIN:  Are you having pain? Yes: NPRS scale: 6/ 10 Pain location:  bilateral cervical spine starting at base of head; lower back at beltline Pain description: achy; sharp Aggravating factors: cervical rotation (Rt > Lt); driving; standing> 5 mins; sitting > 5-10 mins; walking> 5-70mins Relieving factors: Ice/ Heat; stretching    PRECAUTIONS: None  RED FLAGS: None     WEIGHT BEARING RESTRICTIONS: No  FALLS:  Has patient fallen in last 6 months? 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  DIAGNOSTIC FINDINGS:  Cervical Spine MR 12/29/2013 IMPRESSION:  1. Cervical spondylosis and degenerative disc disease, causing  moderate impingement at C5-6 and C6-7 as detailed above.   PATIENT SURVEYS:  EVAL NDI: 34/50 68% 08/20/24  34 / 50 = 68.0 %  Minimum Detectable Change (90% confidence): 5 points or 10% points  EVAL ODI: 35/50 70% 08/20/24 35 / 50 = 70.0 %  COGNITION: Overall cognitive status: Within functional limits for tasks assessed  SENSATION: Numbness and tingling in UE and LEs 0/4 on light touch sensation bilateral but able to feel deeper pressure sensation  POSTURE: rounded shoulders and forward head   CERVICAL ROM: * pain  Active ROM A/PROM (deg) eval AROM  08/08/24 AROM  08/20/24  Flexion 22*  15  Extension 25*  22  Right lateral flexion 20*  12  Left lateral flexion 12*  15  Right rotation 30* 40   Left rotatio/n 30* 42    (Blank row/s = not tested)    UPPER   EXTREMITY MMT: Grossly 4-/5 pain with shoulder abduction & flexion testing Grip strength to be tested    LOWER  EXTREMITY FFU:Hmnddob 4- to 4 /5 bilateral  (very weak hip flexors, abductors)   FUNCTIONAL TESTS:  5 times sit to stand: 23.40sec hands on knees  TREATMENT DATE:   [facility] Adult PT Treatment:  DATE: 08/29/24 Pt seen for aquatic therapy today.  Treatment took place in water 3.5-4.75 ft in depth at the [facility] pool. Temp of water was 91.  Pt entered/exited the pool via stairs with bil rail. - discussion regarding community pool temps and clothing options (pt put lower legs in lap pool - 85) - walking forward/backward with reciprocal arm swing - side stepping with arm add/abdct -> with medium resistance bells  - forward walking/backward walking with reciprocal row motion with medium resistance bells  - bow and arrow with step back, x 10 each side (improved cervical ROM) - UE On rainbow hand floats: hip abdct/add 3 x 5 - staggered stance with full hollow noodle pull down to thighs x 10 each  - UE On yellow noodle: single leg forward leans x 8 each LE - cycling on yellow noodle with doggy paddle arms  Pt requires the buoyancy and hydrostatic pressure of water for support, and to offload joints by unweighting joint load by at least 50 % in navel deep water and by at least 75-80% in chest to neck deep water.  Viscosity of the water is needed for resistance of strengthening. Water current perturbations provides challenge to standing balance requiring increased core activation.    08/26/2024 Updated on status Hooklying shoulder abduction with red TB 2 x 10 Hooklying D2 flexion with red TB 2 x 10 bilateral  Cervical MELT method (flexion/extension & rotation) x 20 each  Hooklying TA activation + ball squeeze 2 x 10 TA activation + march x 10 bilateral  Standing shoulder extension + TA activation with green TB 2 x 10 Cervical  Mechanical Traction 17 lbs of force x 15 mins     08/23/24:Pt seen for aquatic therapy today.  Treatment took place in water 3.5-4.75 ft in depth at the [facility] pool. Temp of water was 91.  Pt entered/exited the pool via stairs independently with bil rail.  -75% water depth water walking: forward/back 3 laps with running arms cues for posture, sidestepping with arm add/ abdct 2 laps - bow and arrow with same side step back x12 - side stepping with arm add/abdct with yellow hand floats - suitcase carry with bil and unilateral yellow hand floats under water at side, walking forward/ backward  - UE on wall:  hip abdct/ add 2 x 12 - open book x 5-6 each side -Bicycle in corner holding wall 5 min in horseback on yellow noodle  [facility] Adult PT Treatment:                                              08/20/2024  Reauth completed NDI ODI Checked goals/ROM Standing chin tucks x 5 - cues to avoid protraction Standing shoulder row & extension with red TB x 20 ea cues for TA  Trigger Point Dry Needling  Subsequent Treatment: Instructions provided previously at initial dry needling treatment.   Patient Verbal Consent Given: Yes Education Handout Provided: Previously Provided Muscles Treated: B suboccipitals, cervical multifidi, UT, gluteals  Electrical Stimulation Performed: No Treatment Response/Outcome: Utilized skilled palpation to identify bony landmarks and trigger points.  Able to illicit twitch response and muscle elongation.  Soft tissue mobilization to muscles needled to further promote tissue elongation and decreased pain.      PATIENT EDUCATION:  Education details: intro to aquatic therapy  Person educated: Patient Education method:  Explanation, Demonstration Education comprehension: verbalized understanding, returned demonstration, and needs further education  HOME EXERCISE PROGRAM: Access Code: [email or web address] URL: [email or web address] Date:  08/05/2024 Prepared by: Kristeen Sar  Exercises - Seated Scapular Retraction  - 1 x daily - 7 x weekly - 1 sets - 10 reps - Seated Cervical Retraction  - 1 x daily - 7 x weekly - 1 sets - 10 reps - Sidelying Thoracic Rotation with Open Book  - 1 x daily - 7 x weekly - 1 sets - 10 reps - Supine Single Knee to Chest Stretch  - 1 x daily - 7 x weekly - 2 sets - 20-30 hold - Supine Gluteus Stretch  - 1 x daily - 7 x weekly - 2 sets - 20-30 hold - Standing Shoulder Row with Anchored Resistance  - 1 x daily - 7 x weekly - 2 sets - 10 reps - Shoulder extension with resistance - Neutral  - 1 x daily - 7 x weekly - 2 sets - 10 reps  ASSESSMENT:  CLINICAL IMPRESSION:  Pt requiring less postural cues while in the water.  Good tolerance to aquatic exercise; reported 2 point reduction in pain.  Pt is making gradual progress towards goals, but would benefit from continued therapy to decrease pain and improve function.  From initial evaluation:  Patient is a 53 y.o. female who was seen today for physical therapy evaluation and treatment for neck and back pain. Jeannia presents with chronic neck and back pain there interferes with her functional mobility and ability to perform work duties. Her cervical rotation is limited and painful and affects her ability to look in her blind spots. She reports having daily headaches that sometimes last multiple days if she is having a bad flare up. She reports numbness and tingling in her upper and lower extremities that also affects her grip strength. Based on evaluation noted poor posture, muscle weakness, and decreased ROM. When testing light touch sensation patient did not feel any points on her thigh and lower leg bilaterally . She felt deep pressure sensation. Educated patient to return to her MD to get a nerve conduction study to see if she has neuropathy. She said she was going to get tested for it in the past, but never got it done. She understood the safety aspect  involved of not having full sensation in her lower extremities. Patient will benefit from skilled PT to address the below impairments and improve overall function.   OBJECTIVE IMPAIRMENTS: decreased endurance, decreased mobility, difficulty walking, decreased ROM, decreased strength, hypomobility, increased fascial restrictions, increased muscle spasms, impaired flexibility, postural dysfunction, and pain.   ACTIVITY LIMITATIONS: carrying, lifting, bending, sitting, standing, squatting, sleeping, stairs, transfers, dressing, reach over head, and hygiene/grooming  PARTICIPATION LIMITATIONS: meal prep, cleaning, laundry, driving, shopping, community activity, and occupation  PERSONAL FACTORS: Age, Fitness, Time since onset of injury/illness/exacerbation, and 1-2 comorbidities: OA are also affecting patient's functional outcome.   REHAB POTENTIAL: Good  CLINICAL DECISION MAKING: Evolving/moderate complexity  EVALUATION COMPLEXITY: Moderate   GOALS: Goals reviewed with patient? Yes  SHORT TERM GOALS: Target date: 08/13/2024  Patient will be independent with initial HEP. Baseline:  Goal status: MET 08/20/24  2.  Patient will report > or = to 30% improvement in sleep quality since starting PT. Baseline: disturbed for 3-4 hours Goal status: in progress - (no change) -08/20/24  3.  Patient will demonstrate correct seated and standing posture with no verbal cues to decrease mechanical strain. Baseline:  Goal status:  MET 08/20/24  4.  Patient will be able to stand for at least 10 mins with < or = to 5/10 back pain for performance of ADLs and house chores.  Baseline: 5 mins Goal status: IN PROGRESS 08/20/24 average pain 6/10 in back can stand 5-10 min before needing to sit.    LONG TERM GOALS: Target date: 09/13/2024  Patient will demonstrate independence in advanced HEP. Baseline:  Goal status: IN PROGRESS  2.  Patient will report > or = to 60% improvement in neck and back pain  since starting PT. Baseline:  Goal status: IN PROGRESS  3.  Patient will complete 5STS in < or= to 19 sec due to improved muscle strength and to be closer to age predicted norms. Baseline: 23.40 sec Goal status: INITIAL   4.  Patient to score < or = to 29/50 on NDI due to improved function and decreased disability.  Baseline: 34/50  Goal status: IN PROGRESS  5.  Patient to score < or = to 25 on ODI due to decreased self perceived disability.  Baseline: 35/50 Goal status: IN PROGRESS  6.  Patient will demonstrate improved cervical ROM by 5-8 degrees to be able to look in her blind spot while driving. Baseline: see chart  Goal status: In progress - 08/20/24   PLAN:  PT FREQUENCY: 2x/week  PT DURATION: 8 weeks  PLANNED INTERVENTIONS: [id number]- PT Re-evaluation, 97110-Therapeutic exercises, 97530- Therapeutic activity, 97112- Neuromuscular re-education, 97535- Self Care, [id number]- Manual therapy, [phone]- Gait training, [phone]- Canalith repositioning, [id number]- Aquatic Therapy, [phone]- Electrical stimulation (unattended), [phone]- Electrical stimulation (manual), [id number]- Vasopneumatic device, [id number]- Ultrasound, [id number]- Traction (mechanical), [id number]- Ionotophoresis 4mg /ml Dexamethasone, [id number] (1-2 muscles), [id number] (3+ muscles)- Dry Needling, Patient/Family education, Balance training, Stair training, Taping, Joint mobilization, Joint manipulation, Spinal manipulation, Spinal mobilization, Vestibular training, Cryotherapy, and Moist heat  PLAN FOR NEXT SESSION:  assess response to cervical traction; Recert needed after 11/21: land next: progress core HEP & dry needling; core and postural strengthening; cervical melt method;   Delon Aquas, PTA 08/29/24 4:29 PM [facility] [address] [place], [place], [address] Phone: [phone]   Fax:  [phone]

## 2024-09-02 ENCOUNTER — Encounter: Payer: Self-pay | Admitting: Physical Therapy

## 2024-09-02 ENCOUNTER — Ambulatory Visit: Admitting: Physical Therapy

## 2024-09-02 DIAGNOSIS — M542 Cervicalgia: Secondary | ICD-10-CM

## 2024-09-02 DIAGNOSIS — M5459 Other low back pain: Secondary | ICD-10-CM

## 2024-09-02 DIAGNOSIS — M6281 Muscle weakness (generalized): Secondary | ICD-10-CM

## 2024-09-02 DIAGNOSIS — R252 Cramp and spasm: Secondary | ICD-10-CM

## 2024-09-02 NOTE — Therapy (Signed)
 OUTPATIENT PHYSICAL THERAPY CERVICAL/ LUMBAR TREATMENT/ RE-CERTIFICATION    Patient Name: Christy Holland MRN: [id number] DOB:23-May-1971, 53 y.o., female Today's Date: 09/02/2024  END OF SESSION:  PT End of Session - 09/02/24 1711     Visit Number 12    Date for Recertification  10/28/24    Authorization Type Christy Holland APPROVED 4 VL 08/26/2024-09/24/2024-AUTH#[id number]    Authorization Time Period 08/26/2024-09/24/2024    Authorization - Visit Number 3    Authorization - Number of Visits 4    PT Start Time 1625    PT Stop Time 1710    PT Time Calculation (min) 45 min    Activity Tolerance Patient tolerated treatment well    Behavior During Therapy [facility] for tasks assessed/performed            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/2017   Rhinitis, chronic 08/21/2017   Sinusitis 02/13/2018   Snoring 08/21/2017   Past Surgical History:  Procedure Laterality Date   PVC ABLATION N/A 06/18/2021   Procedure: PVC ABLATION;  Surgeon: Waddell Danelle ORN, MD;  Location: [facility];  Service: Cardiovascular;  Laterality: N/A;   Patient Active Problem List   Diagnosis Date Noted   Menorrhagia 05/01/2024   Osteoarthritis of right knee 01/26/2024   Bilateral carpal tunnel syndrome 12/27/2023   Arthralgia of right knee 11/22/2023   Low back pain 11/14/2023   Neck pain 11/14/2023   Episodic recurrent vertigo 07/31/2023   Balance disorder 07/17/2023   Dizziness 07/17/2023   Carpal tunnel syndrome on both sides 04/04/2022   Chronic right shoulder pain 04/04/2022   Cervical radiculopathy 04/04/2022   Class 2 severe obesity due to excess calories with serious comorbidity and body mass index (BMI) of 36.0 to 36.9 in adult 03/17/2022   History of cardiac radiofrequency ablation 01/18/2022   Insomnia 11/12/2021   Mass of left breast  10/19/2021   Acne vulgaris 09/28/2021   Dyspnea on exertion 04/03/2021   Holter monitor, abnormal 08/22/2019   Frequent PVCs 03/01/2019   Sinusitis 02/13/2018   Chronic maxillary sinusitis 08/21/2017   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/21/2017   Rhinitis, chronic 08/21/2017   OSA (obstructive sleep apnea) 08/21/2017    PCP: Leonce Carola PARAS, PA-C   REFERRING PROVIDER: Faye Lauraine PARAS, FNP  REFERRING DIAG:  Diagnosis  M54.12 (ICD-10-CM) - Radiculopathy, cervical region    THERAPY DIAG:  Cervicalgia  Muscle weakness (generalized)  Other low back pain  Cramp and spasm  Rationale for Evaluation and Treatment: Rehabilitation  ONSET DATE: Chronic   SUBJECTIVE:  SUBJECTIVE STATEMENT:  Patient reports she responded well to cervical traction last visit. Her tingling in her left arm has decreased some. Today while driving she noticed tingling in her right hand but she is not sure if this is because of carpal tunnel. 3/10 pain today  POOL ACCESS: currently none.    From initial evaluation:  Patient presents with chronic neck pain that hurts constantly. She has done injections and nerve blocks in the past. She reports having DDD and compressed nerves. It is more challenging looking to the right than the left. She reports numbness and tingling down her arms to all of her fingers. She reports differences in her grip strength and she drops objects. Pain impacts her sleep at night. She has daily headaches that last multiple days when she is having a bad flare up.She also reports chronic back pain that interferes with her standing and sitting tolerance. She has numbness and tingling in both of her legs that goes all the way down to her feet.  Hand dominance: Right  PERTINENT HISTORY:   Hx back neck and right shoulder pain; OA of right knee  PAIN:  Are you having pain? Yes: NPRS scale: 3/ 10 Pain location: bilateral cervical spine starting at base of head; lower back at beltline Pain description: achy; sharp Aggravating factors: cervical rotation (Rt > Lt); driving; standing> 5 mins; sitting > 5-10 mins; walking> 5-31mins Relieving factors: Ice/ Heat; stretching    PRECAUTIONS: None  RED FLAGS: None     WEIGHT BEARING RESTRICTIONS: No  FALLS:  Has patient fallen in last 6 months? 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  DIAGNOSTIC FINDINGS:  Cervical Spine MR 12/29/2013 IMPRESSION:  1. Cervical spondylosis and degenerative disc disease, causing  moderate impingement at C5-6 and C6-7 as detailed above.   PATIENT SURVEYS:  EVAL NDI: 34/50 68% 08/20/24  34 / 50 = 68.0 % 09/02/2024 21/50 42%  Minimum Detectable Change (90% confidence): 5 points or 10% points  EVAL ODI: 35/50 70% 08/20/24 35 / 50 = 70.0 % 09/02/2024 31/50 62%  COGNITION: Overall cognitive status: Within functional limits for tasks assessed  SENSATION: Numbness and tingling in UE and LEs 0/4 on light touch sensation bilateral but able to feel deeper pressure sensation  POSTURE: rounded shoulders and forward head   CERVICAL ROM: * pain  Active ROM A/PROM (deg) eval AROM  08/08/24 AROM  08/20/24 A/ROM 09/02/2024  Flexion  22*  15   Extension 25*  22   Right lateral flexion 20*  12   Left lateral flexion 12*  15   Right rotation 30* 40  80  Left rotatio/n 30* 42  70   (Blank row/s = not tested)    UPPER  EXTREMITY MMT: Grossly 4-/5 pain with shoulder abduction & flexion testing Grip strength to be tested    LOWER  EXTREMITY FFU:Hmnddob 4- to 4 /5 bilateral  (very weak hip flexors, abductors)   FUNCTIONAL TESTS:  5 times sit to stand: 23.40sec hands  on knees  09/02/2024 14.09 sec some UE support  TREATMENT DATE:  09/02/2024 NuStep Level 4 6 mins- PT present to discuss status 5STS: 14.09 sec some UE support NDI: 21/50 42% ODI: 31/50 62% Cervical Mechanical Traction 18 lbs of force x 15 mins      [facility] Adult PT Treatment:                                                DATE: 08/29/24 Pt seen for aquatic therapy today.  Treatment took place in water 3.5-4.75 ft in depth at the [facility] pool. Temp of water was 91.  Pt entered/exited the pool via stairs with bil rail. - discussion regarding community pool temps and clothing options (pt put lower legs in lap pool - 85) - walking forward/backward with reciprocal arm swing - side stepping with arm add/abdct -> with medium resistance bells  - forward walking/backward walking with reciprocal row motion with medium resistance bells  - bow and arrow with step back, x 10 each side (improved cervical ROM) - UE On rainbow hand floats: hip abdct/add 3 x 5 - staggered stance with full hollow noodle pull down to thighs x 10 each  - UE On yellow noodle: single leg forward leans x 8 each LE - cycling on yellow noodle with doggy paddle arms  Pt requires the buoyancy and hydrostatic pressure of water for support, and to offload joints by unweighting joint load by at least 50 % in navel deep water and by at least 75-80% in chest to neck deep water.  Viscosity of the water is needed for resistance of strengthening. Water current perturbations provides  challenge to standing balance requiring increased core activation.    08/26/2024 Updated on status Hooklying shoulder abduction with red TB 2 x 10 Hooklying D2 flexion with red TB 2 x 10 bilateral  Cervical MELT method (flexion/extension & rotation) x 20 each  Hooklying TA activation + ball squeeze 2 x 10 TA activation + march x 10 bilateral  Standing shoulder extension + TA activation with green TB 2 x 10 Cervical Mechanical Traction 17 lbs of force x 15 mins     08/23/24:Pt seen for aquatic therapy today.  Treatment took place in water 3.5-4.75 ft in depth at the [facility] pool. Temp of water was 91.  Pt entered/exited the pool via stairs independently with bil rail.  -75% water depth water walking: forward/back 3 laps with running arms cues for posture, sidestepping with arm add/ abdct 2 laps - bow and arrow with same side step back x12 - side stepping with arm add/abdct with yellow hand floats - suitcase carry with bil and unilateral yellow hand floats under water at side, walking forward/ backward  - UE on wall:  hip abdct/ add 2 x 12 - open book x 5-6 each side -Bicycle in corner holding wall 5 min in horseback on yellow noodle    PATIENT EDUCATION:  Education details: intro to aquatic therapy  Person educated: Patient Education method: Programmer, Multimedia, Demonstration Education comprehension: verbalized understanding, returned demonstration, and needs further education  HOME EXERCISE PROGRAM: Access Code: [email or web address] URL: [email or web address] Date: 08/05/2024 Prepared by: Kristeen Sar  Exercises - Seated Scapular Retraction  - 1 x daily - 7 x weekly - 1 sets - 10 reps - Seated Cervical Retraction  - 1 x daily - 7  x weekly - 1 sets - 10 reps - Sidelying Thoracic Rotation with Open Book  - 1 x daily - 7 x weekly - 1 sets - 10 reps - Supine Single Knee to Chest Stretch  - 1 x daily - 7 x weekly - 2 sets - 20-30 hold - Supine Gluteus Stretch  - 1 x  daily - 7 x weekly - 2 sets - 20-30 hold - Standing Shoulder Row with Anchored Resistance  - 1 x daily - 7 x weekly - 2 sets - 10 reps - Shoulder extension with resistance - Neutral  - 1 x daily - 7 x weekly - 2 sets - 10 reps  ASSESSMENT:  CLINICAL IMPRESSION:  Re-certification completed today. Zilpha verbalized feeling 60% better since starting skilled therapy. We have recently initiated cervical traction to target patient's radicular symptoms. Radicular symptoms have improved some since initiating this. Noted great improvements in patient's 5STS, cervical rotation ROM, and outcome measures since evaluation. She verbalized difficulty with her grip strength and ability to bring in groceries and lifting. Created goals for these activities for next 8 weeks. Progress has been slow, but patient demonstrated benefit from skilled therapy, and since last assessment patient has demonstrated marked improvements. She would benefit from continued therapy to target radicular symptoms, review correct lifting technique, and improve core strength.   From initial evaluation:  Patient is a 53 y.o. female who was seen today for physical therapy evaluation and treatment for neck and back pain. Landree presents with chronic neck and back pain there interferes with her functional mobility and ability to perform work duties. Her cervical rotation is limited and painful and affects her ability to look in her blind spots. She reports having daily headaches that sometimes last multiple days if she is having a bad flare up. She reports numbness and tingling in her upper and lower extremities that also affects her grip strength. Based on evaluation noted poor posture, muscle weakness, and decreased ROM. When testing light touch sensation patient did not feel any points on her thigh and lower leg bilaterally . She felt deep pressure sensation. Educated patient to return to her MD to get a nerve conduction study to see if she has  neuropathy. She said she was going to get tested for it in the past, but never got it done. She understood the safety aspect involved of not having full sensation in her lower extremities. Patient will benefit from skilled PT to address the below impairments and improve overall function.   OBJECTIVE IMPAIRMENTS: decreased endurance, decreased mobility, difficulty walking, decreased ROM, decreased strength, hypomobility, increased fascial restrictions, increased muscle spasms, impaired flexibility, postural dysfunction, and pain.   ACTIVITY LIMITATIONS: carrying, lifting, bending, sitting, standing, squatting, sleeping, stairs, transfers, dressing, reach over head, and hygiene/grooming  PARTICIPATION LIMITATIONS: meal prep, cleaning, laundry, driving, shopping, community activity, and occupation  PERSONAL FACTORS: Age, Fitness, Time since onset of injury/illness/exacerbation, and 1-2 comorbidities: OA are also affecting patient's functional outcome.   REHAB POTENTIAL: Good  CLINICAL DECISION MAKING: Evolving/moderate complexity  EVALUATION COMPLEXITY: Moderate   GOALS: Goals reviewed with patient? Yes  SHORT TERM GOALS: Target date: 08/13/2024  Patient will be independent with initial HEP. Baseline:  Goal status: MET 08/20/24  2.  Patient will report > or = to 30% improvement in sleep quality since starting PT. Baseline: disturbed for 3-4 hours Goal status: MET (60%) 09/02/2024  3.  Patient will demonstrate correct seated and standing posture with no verbal cues to decrease mechanical  strain. Baseline:  Goal status: MET 08/20/24  4.  Patient will be able to stand for at least 10 mins with < or = to 5/10 back pain for performance of ADLs and house chores.  Baseline: 5 mins Goal status:MET 09/02/2024   LONG TERM GOALS: Target date: 10/28/2024   Patient will demonstrate independence in advanced HEP. Baseline:  Goal status: IN PROGRESS 09/02/2024  2.  Patient will report > or  = to 60% improvement in neck and back pain since starting PT. Baseline:  Goal status: MET (60%) 09/02/2024  3.  Patient will complete 5STS in < or= to 19 sec due to improved muscle strength and to be closer to age predicted norms. Baseline: 23.40 sec Goal status: MET 09/02/2024   4.  Patient to score < or = to 29/50 on NDI due to improved function and decreased disability.  Baseline: 34/50  Goal status: MET 09/02/2024  5.  Patient to score < or = to 25 on ODI due to decreased self perceived disability.  Baseline: 35/50 Goal status: IN PROGRESS (31/50) 09/02/2024  6.  Patient will demonstrate improved cervical ROM by 5-8 degrees to be able to look in her blind spot while driving. Baseline: see chart  Goal status: MET 09/02/2024  7.  Patient will be able to carry 7-8# unilaterally to help ability to carry in groceries.  Baseline:  Goal status: NEW  8.  Patient will be able to demonstrate proper lifting and squatting technique to prevent mechanical strain and help ability to perform home chores. Baseline:  Goal status: NEW  9.  Patient will verbalize 50% improvement in UE numbness/tingling for improved functional use of upper extremities. Baseline:  Goal status: NEW   PLAN:  PT FREQUENCY: 2x/week  PT DURATION: 8 weeks  PLANNED INTERVENTIONS: [id number]- PT Re-evaluation, 97110-Therapeutic exercises, 97530- Therapeutic activity, 97112- Neuromuscular re-education, 97535- Self Care, [id number]- Manual therapy, [phone]- Gait training, [phone]- Canalith repositioning, [phone]- Aquatic Therapy, [phone]- Electrical stimulation (unattended), [phone]- Electrical stimulation (manual), [id number]- Vasopneumatic device, [id number]- Ultrasound, [id number]- Traction (mechanical), [id number]- Ionotophoresis 4mg /ml Dexamethasone, [id number] (1-2 muscles), [id number] (3+ muscles)- Dry Needling, Patient/Family education, Balance training, Stair training, Taping, Joint mobilization, Joint manipulation, Spinal manipulation, Spinal mobilization,  Vestibular training, Cryotherapy, and Moist heat  PLAN FOR NEXT SESSION:  assess response UE tingling/ numbness;  land next: progress core HEP & dry needling; lifting technique; cervical melt method; patient needs to schedule more visit next land visit  Kristeen Sar, PT, DPT 09/02/24 5:12 PM [facility] [address] [place], [place] [address] Phone # [phone] Fax [phone]

## 2024-09-05 ENCOUNTER — Ambulatory Visit (HOSPITAL_BASED_OUTPATIENT_CLINIC_OR_DEPARTMENT_OTHER): Admitting: Physical Therapy

## 2024-09-10 ENCOUNTER — Ambulatory Visit: Admitting: Physical Therapy

## 2024-09-10 ENCOUNTER — Encounter: Payer: Self-pay | Admitting: Physical Therapy

## 2024-09-10 DIAGNOSIS — M542 Cervicalgia: Secondary | ICD-10-CM

## 2024-09-10 DIAGNOSIS — R252 Cramp and spasm: Secondary | ICD-10-CM

## 2024-09-10 DIAGNOSIS — M6281 Muscle weakness (generalized): Secondary | ICD-10-CM

## 2024-09-10 DIAGNOSIS — M5459 Other low back pain: Secondary | ICD-10-CM

## 2024-09-10 NOTE — Therapy (Signed)
 OUTPATIENT PHYSICAL THERAPY CERVICAL/ LUMBAR TREATMENT   Patient Name: Christy Holland MRN: [id number] DOB:1971/02/03, 53 y.o., female Today's Date: 09/10/2024  END OF SESSION:  PT End of Session - 09/10/24 1212     Visit Number 13    Date for Recertification  10/28/24    Authorization Type Sherleen /CARELON APPROVED 4 VL 08/26/2024-09/24/2024-AUTH#[id number] Next Visit:[employer] Approved 4 vl, 09/10/2024 - 10/09/2024, auth# [id number]    Authorization Time Period 08/26/2024-09/24/2024    Authorization - Visit Number 4    Authorization - Number of Visits 4    PT Start Time 1110    PT Stop Time 1205    PT Time Calculation (min) 55 min    Activity Tolerance Patient tolerated treatment well    Behavior During Therapy [facility] for tasks assessed/performed             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/2017   Rhinitis, chronic 08/21/2017   Sinusitis 02/13/2018   Snoring 08/21/2017   Past Surgical History:  Procedure Laterality Date   PVC ABLATION N/A 06/18/2021   Procedure: PVC ABLATION;  Surgeon: Waddell Danelle ORN, MD;  Location: [facility];  Service: Cardiovascular;  Laterality: N/A;   Patient Active Problem List   Diagnosis Date Noted   Menorrhagia 05/01/2024   Osteoarthritis of right knee 01/26/2024   Bilateral carpal tunnel syndrome 12/27/2023   Arthralgia of right knee 11/22/2023   Low back pain 11/14/2023   Neck pain 11/14/2023   Episodic recurrent vertigo 07/31/2023   Balance disorder 07/17/2023   Dizziness 07/17/2023   Carpal tunnel syndrome on both sides 04/04/2022   Chronic right shoulder pain 04/04/2022   Cervical radiculopathy 04/04/2022   Class 2 severe obesity due to excess calories with serious comorbidity and body mass index (BMI) of 36.0 to 36.9 in adult 03/17/2022   History of cardiac radiofrequency ablation  01/18/2022   Insomnia 11/12/2021   Mass of left breast 10/19/2021   Acne vulgaris 09/28/2021   Dyspnea on exertion 04/03/2021   Holter monitor, abnormal 08/22/2019   Frequent PVCs 03/01/2019   Sinusitis 02/13/2018   Chronic maxillary sinusitis 08/21/2017   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/21/2017   Rhinitis, chronic 08/21/2017   OSA (obstructive sleep apnea) 08/21/2017    PCP: Leonce Carola PARAS, PA-C   REFERRING PROVIDER: Faye Lauraine PARAS, FNP  REFERRING DIAG:  Diagnosis  M54.12 (ICD-10-CM) - Radiculopathy, cervical region    THERAPY DIAG:  Cervicalgia  Muscle weakness (generalized)  Other low back pain  Cramp and spasm  Rationale for Evaluation and Treatment: Rehabilitation  ONSET DATE: Chronic   SUBJECTIVE:  SUBJECTIVE STATEMENT:  Patient reports increased right sided neck numbness and tingling. Pain is 4/10. This morning it felt like her whole hand was on fire.  POOL ACCESS: currently none.    From initial evaluation:  Patient presents with chronic neck pain that hurts constantly. She has done injections and nerve blocks in the past. She reports having DDD and compressed nerves. It is more challenging looking to the right than the left. She reports numbness and tingling down her arms to all of her fingers. She reports differences in her grip strength and she drops objects. Pain impacts her sleep at night. She has daily headaches that last multiple days when she is having a bad flare up.She also reports chronic back pain that interferes with her standing and sitting tolerance. She has numbness and tingling in both of her legs that goes all the way down to her feet.  Hand dominance: Right  PERTINENT HISTORY:  Hx back neck and right shoulder pain; OA of right  knee  PAIN:  Are you having pain? Yes: NPRS scale: 3/ 10 Pain location: bilateral cervical spine starting at base of head; lower back at beltline Pain description: achy; sharp Aggravating factors: cervical rotation (Rt > Lt); driving; standing> 5 mins; sitting > 5-10 mins; walking> 5-77mins Relieving factors: Ice/ Heat; stretching    PRECAUTIONS: None  RED FLAGS: None     WEIGHT BEARING RESTRICTIONS: No  FALLS:  Has patient fallen in last 6 months? 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  DIAGNOSTIC FINDINGS:  Cervical Spine MR 12/29/2013 IMPRESSION:  1. Cervical spondylosis and degenerative disc disease, causing  moderate impingement at C5-6 and C6-7 as detailed above.   PATIENT SURVEYS:  EVAL NDI: 34/50 68% 08/20/24  34 / 50 = 68.0 % 09/02/2024 21/50 42%  Minimum Detectable Change (90% confidence): 5 points or 10% points  EVAL ODI: 35/50 70% 08/20/24 35 / 50 = 70.0 % 09/02/2024 31/50 62%  COGNITION: Overall cognitive status: Within functional limits for tasks assessed  SENSATION: Numbness and tingling in UE and LEs 0/4 on light touch sensation bilateral but able to feel deeper pressure sensation  POSTURE: rounded shoulders and forward head   CERVICAL ROM: * pain  Active ROM A/PROM (deg) eval AROM  08/08/24 AROM  08/20/24 A/ROM 09/02/2024  Flexion 22*  15   Extension 25*  22   Right lateral  flexion 20*  12   Left lateral flexion 12*  15   Right rotation 30* 40  80  Left rotatio/n 30* 42  70   (Blank row/s = not tested)    UPPER  EXTREMITY MMT: Grossly 4-/5 pain with shoulder abduction & flexion testing Grip strength to be tested    LOWER  EXTREMITY FFU:Hmnddob 4- to 4 /5 bilateral  (very weak hip flexors, abductors)   FUNCTIONAL TESTS:  5 times sit to stand: 23.40sec hands on knees  09/02/2024 14.09 sec some UE support  TREATMENT DATE:   [facility] Adult PT Treatment:  DATE:  09/10/2024 (Patient 10 mins late to appt) NuStep Level 4  - 5 mins- PT present to discuss status Cervical MELT method (flexion/extension & rotation) x 20 each  Foam roll along spine + shoulder abduction with green TB x 12 Foam roll along spine + shoulder D2 flexion with green TB x 12 bilateral  Hooklying alt hand and knee press with purple ball x 10 bilateral  Bridge + ball in between knees x 10  SL clamshell + TA activation 2 x 10 bilateral  Sit to stand holding 5# DB 2 x 10 Standing scaption + flexion 2# x 10 each  Cervical Mechanical Traction 18 lbs of force x 15 mins      09/02/2024 NuStep Level 4 6 mins- PT present to discuss status 5STS: 14.09 sec some UE support NDI: 21/50 42% ODI: 31/50 62% Cervical Mechanical Traction 18 lbs of force x 15 mins      08/29/24 Pt seen for aquatic therapy today.  Treatment took place in water 3.5-4.75 ft in depth at the [facility] pool. Temp of water was 91.  Pt entered/exited the pool via stairs with bil rail. - discussion regarding community pool temps and clothing options (pt put lower legs in lap pool - 85) - walking forward/backward with reciprocal arm swing - side stepping with arm add/abdct -> with medium resistance bells  - forward walking/backward walking with reciprocal row motion with medium resistance bells  - bow and arrow with step back, x 10 each side (improved cervical  ROM) - UE On rainbow hand floats: hip abdct/add 3 x 5 - staggered stance with full hollow noodle pull down to thighs x 10 each  - UE On yellow noodle: single leg forward leans x 8 each LE - cycling on yellow noodle with doggy paddle arms  Pt requires the buoyancy and hydrostatic pressure of water for support, and to offload joints by unweighting joint load by at least 50 % in navel deep water and by at least 75-80% in chest to neck deep water.  Viscosity of the water is needed for resistance of strengthening. Water current perturbations provides challenge to standing balance requiring increased core activation.    08/26/2024 Updated on status Hooklying shoulder abduction with red TB 2 x 10 Hooklying D2 flexion with red TB 2 x 10 bilateral  Cervical MELT method (flexion/extension & rotation) x 20 each  Hooklying TA activation + ball squeeze 2 x 10 TA activation + march x 10 bilateral  Standing shoulder extension + TA activation with green TB 2 x 10 Cervical Mechanical Traction 17 lbs of force x 15 mins      PATIENT EDUCATION:  Education details: intro to aquatic therapy  Person educated: Patient Education method: Explanation, Demonstration Education comprehension: verbalized understanding, returned demonstration, and needs further education  HOME EXERCISE PROGRAM: Access Code: [email or web address] URL: [email or web address] Date: 08/05/2024 Prepared by: Kristeen Sar  Exercises - Seated Scapular Retraction  - 1 x daily - 7 x weekly - 1 sets - 10 reps - Seated Cervical Retraction  - 1 x daily - 7 x weekly - 1 sets - 10 reps - Sidelying Thoracic Rotation with Open Book  - 1 x daily - 7 x weekly - 1 sets - 10 reps - Supine Single Knee to Chest Stretch  - 1 x daily - 7 x weekly - 2 sets - 20-30 hold - Supine Gluteus Stretch  - 1 x daily - 7 x weekly - 2 sets - 20-30 hold -  Standing Shoulder Row with Anchored Resistance  - 1 x daily - 7 x weekly - 2 sets - 10 reps - Shoulder  extension with resistance - Neutral  - 1 x daily - 7 x weekly - 2 sets - 10 reps  ASSESSMENT:  CLINICAL IMPRESSION:  Christy Holland presents with 4/10 pain today. She reports increased right sided numbness and tingling in her right arm. She feels that traction is decreasing the frequency and intensity of her tingling. Treatment session focused on postural strengthening and core activation. She was challenged with hip strengthening exercises. PT provided verbal and tactile cues with reminders for breathing technique while performing exercises.  Patient demonstrates good rehab potential to achieve stated goals through skilled therapy intervention.    From initial evaluation:  Patient is a 53 y.o. female who was seen today for physical therapy evaluation and treatment for neck and back pain. Annakate presents with chronic neck and back pain there interferes with her functional mobility and ability to perform work duties. Her cervical rotation is limited and painful and affects her ability to look in her blind spots. She reports having daily headaches that sometimes last multiple days if she is having a bad flare up. She reports numbness and tingling in her upper and lower extremities that also affects her grip strength. Based on evaluation noted poor posture, muscle weakness, and decreased ROM. When testing light touch sensation patient did not feel any points on her thigh and lower leg bilaterally . She felt deep pressure sensation. Educated patient to return to her MD to get a nerve conduction study to see if she has neuropathy. She said she was going to get tested for it in the past, but never got it done. She understood the safety aspect involved of not having full sensation in her lower extremities. Patient will benefit from skilled PT to address the below impairments and improve overall function.   OBJECTIVE IMPAIRMENTS: decreased endurance, decreased mobility, difficulty walking, decreased ROM, decreased  strength, hypomobility, increased fascial restrictions, increased muscle spasms, impaired flexibility, postural dysfunction, and pain.   ACTIVITY LIMITATIONS: carrying, lifting, bending, sitting, standing, squatting, sleeping, stairs, transfers, dressing, reach over head, and hygiene/grooming  PARTICIPATION LIMITATIONS: meal prep, cleaning, laundry, driving, shopping, community activity, and occupation  PERSONAL FACTORS: Age, Fitness, Time since onset of injury/illness/exacerbation, and 1-2 comorbidities: OA are also affecting patient's functional outcome.   REHAB POTENTIAL: Good  CLINICAL DECISION MAKING: Evolving/moderate complexity  EVALUATION COMPLEXITY: Moderate   GOALS: Goals reviewed with patient? Yes  SHORT TERM GOALS: Target date: 08/13/2024  Patient will be independent with initial HEP. Baseline:  Goal status: MET 08/20/24  2.  Patient will report > or = to 30% improvement in sleep quality since starting PT. Baseline: disturbed for 3-4 hours Goal status: MET (60%) 09/02/2024  3.  Patient will demonstrate correct seated and standing posture with no verbal cues to decrease mechanical strain. Baseline:  Goal status: MET 08/20/24  4.  Patient will be able to stand for at least 10 mins with < or = to 5/10 back pain for performance of ADLs and house chores.  Baseline: 5 mins Goal status:MET 09/02/2024   LONG TERM GOALS: Target date: 10/28/2024   Patient will demonstrate independence in advanced HEP. Baseline:  Goal status: IN PROGRESS 09/02/2024  2.  Patient will report > or = to 60% improvement in neck and back pain since starting PT. Baseline:  Goal status: MET (60%) 09/02/2024  3.  Patient will complete 5STS in <  or= to 19 sec due to improved muscle strength and to be closer to age predicted norms. Baseline: 23.40 sec Goal status: MET 09/02/2024   4.  Patient to score < or = to 29/50 on NDI due to improved function and decreased disability.  Baseline: 34/50   Goal status: MET 09/02/2024  5.  Patient to score < or = to 25 on ODI due to decreased self perceived disability.  Baseline: 35/50 Goal status: IN PROGRESS (31/50) 09/02/2024  6.  Patient will demonstrate improved cervical ROM by 5-8 degrees to be able to look in her blind spot while driving. Baseline: see chart  Goal status: MET 09/02/2024  7.  Patient will be able to carry 7-8# unilaterally to help ability to carry in groceries.  Baseline:  Goal status: NEW  8.  Patient will be able to demonstrate proper lifting and squatting technique to prevent mechanical strain and help ability to perform home chores. Baseline:  Goal status: NEW  9.  Patient will verbalize 50% improvement in UE numbness/tingling for improved functional use of upper extremities. Baseline:  Goal status: NEW   PLAN:  PT FREQUENCY: 2x/week  PT DURATION: 8 weeks  PLANNED INTERVENTIONS: [id number]- PT Re-evaluation, 97110-Therapeutic exercises, 97530- Therapeutic activity, 97112- Neuromuscular re-education, 97535- Self Care, [id number]- Manual therapy, [phone]- Gait training, [phone]- Canalith repositioning, [id number]- Aquatic Therapy, [phone]- Electrical stimulation (unattended), [phone]- Electrical stimulation (manual), [id number]- Vasopneumatic device, [id number]- Ultrasound, [id number]- Traction (mechanical), [id number]- Ionotophoresis 4mg /ml Dexamethasone, [id number] (1-2 muscles), [id number] (3+ muscles)- Dry Needling, Patient/Family education, Balance training, Stair training, Taping, Joint mobilization, Joint manipulation, Spinal manipulation, Spinal mobilization, Vestibular training, Cryotherapy, and Moist heat  PLAN FOR NEXT SESSION:  update insurance auth: [employer] Approved 4 vl, 09/10/2024 - 10/09/2024, auth# [id number]  assess response UE tingling/ numbness;  lifting technique; cervical melt method  Kristeen Sar, PT, DPT 09/10/24 12:14 PM [facility] [address], [address] [place], [place] [address] Phone # [phone] Fax  [phone]

## 2024-09-12 ENCOUNTER — Ambulatory Visit (HOSPITAL_BASED_OUTPATIENT_CLINIC_OR_DEPARTMENT_OTHER): Admitting: Physical Therapy

## 2024-09-12 ENCOUNTER — Encounter (HOSPITAL_BASED_OUTPATIENT_CLINIC_OR_DEPARTMENT_OTHER): Payer: Self-pay | Admitting: Physical Therapy

## 2024-09-12 DIAGNOSIS — R252 Cramp and spasm: Secondary | ICD-10-CM

## 2024-09-12 DIAGNOSIS — M542 Cervicalgia: Secondary | ICD-10-CM

## 2024-09-12 DIAGNOSIS — M6281 Muscle weakness (generalized): Secondary | ICD-10-CM

## 2024-09-12 DIAGNOSIS — M5459 Other low back pain: Secondary | ICD-10-CM

## 2024-09-12 NOTE — Therapy (Signed)
 OUTPATIENT PHYSICAL THERAPY CERVICAL/ LUMBAR TREATMENT   Patient Name: Christy Holland MRN: [id number] DOB:02-20-1971, 53 y.o., female Today's Date: 09/12/2024  END OF SESSION:  PT End of Session - 09/12/24 0737     Visit Number 14    Date for Recertification  10/28/24    Authorization Type [employer] Approved 4 vl, 09/10/2024 - 10/09/2024, auth# [id number]    Authorization Time Period 09/10/24-10/09/24    Authorization - Visit Number 2    Authorization - Number of Visits 4    PT Start Time 0721    PT Stop Time 0759    PT Time Calculation (min) 38 min    Activity Tolerance Patient tolerated treatment well    Behavior During Therapy [facility] for tasks assessed/performed             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/2017   Rhinitis, chronic 08/21/2017   Sinusitis 02/13/2018   Snoring 08/21/2017   Past Surgical History:  Procedure Laterality Date   PVC ABLATION N/A 06/18/2021   Procedure: PVC ABLATION;  Surgeon: Waddell Danelle ORN, MD;  Location: [facility];  Service: Cardiovascular;  Laterality: N/A;   Patient Active Problem List   Diagnosis Date Noted   Menorrhagia 05/01/2024   Osteoarthritis of right knee 01/26/2024   Bilateral carpal tunnel syndrome 12/27/2023   Arthralgia of right knee 11/22/2023   Low back pain 11/14/2023   Neck pain 11/14/2023   Episodic recurrent vertigo 07/31/2023   Balance disorder 07/17/2023   Dizziness 07/17/2023   Carpal tunnel syndrome on both sides 04/04/2022   Chronic right shoulder pain 04/04/2022   Cervical radiculopathy 04/04/2022   Class 2 severe obesity due to excess calories with serious comorbidity and body mass index (BMI) of 36.0 to 36.9 in adult 03/17/2022   History of cardiac radiofrequency ablation 01/18/2022   Insomnia 11/12/2021   Mass of left breast 10/19/2021    Acne vulgaris 09/28/2021   Dyspnea on exertion 04/03/2021   Holter monitor, abnormal 08/22/2019   Frequent PVCs 03/01/2019   Sinusitis 02/13/2018   Chronic maxillary sinusitis 08/21/2017   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/21/2017   Rhinitis, chronic 08/21/2017   OSA (obstructive sleep apnea) 08/21/2017    PCP: Leonce Carola PARAS, PA-C   REFERRING PROVIDER: Faye Lauraine PARAS, FNP  REFERRING DIAG:  Diagnosis  M54.12 (ICD-10-CM) - Radiculopathy, cervical region    THERAPY DIAG:  Cervicalgia  Muscle weakness (generalized)  Other low back pain  Cramp and spasm  Rationale for Evaluation and Treatment: Rehabilitation  ONSET DATE: Chronic   SUBJECTIVE:  SUBJECTIVE STATEMENT:  Patient reports she felt she worked some new muscles at last therapy session.    POOL ACCESS: currently none.    From initial evaluation:  Patient presents with chronic neck pain that hurts constantly. She has done injections and nerve blocks in the past. She reports having DDD and compressed nerves. It is more challenging looking to the right than the left. She reports numbness and tingling down her arms to all of her fingers. She reports differences in her grip strength and she drops objects. Pain impacts her sleep at night. She has daily headaches that last multiple days when she is having a bad flare up.She also reports chronic back pain that interferes with her standing and sitting tolerance. She has numbness and tingling in both of her legs that goes all the way down to her feet.  Hand dominance: Right  PERTINENT HISTORY:  Hx back neck and right shoulder pain; OA of right knee  PAIN:  Are you having pain? Yes: NPRS scale: 4-5/ 10 not too bad Pain location: bilateral cervical spine starting at  base of head; lower back at beltline Pain description: achy; sharp Aggravating factors: cervical rotation (Rt > Lt); driving; standing> 5 mins; sitting > 5-10 mins; walking> 5-13mins Relieving factors: Ice/ Heat; stretching    PRECAUTIONS: None  RED FLAGS: None     WEIGHT BEARING RESTRICTIONS: No  FALLS:  Has patient fallen in last 6 months? 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  DIAGNOSTIC FINDINGS:  Cervical Spine MR 12/29/2013 IMPRESSION:  1. Cervical spondylosis and degenerative disc disease, causing  moderate impingement at C5-6 and C6-7 as detailed above.   PATIENT SURVEYS:  EVAL NDI: 34/50 68% 08/20/24  34 / 50 = 68.0 % 09/02/2024 21/50 42%  Minimum Detectable Change (90% confidence): 5 points or 10% points  EVAL ODI: 35/50 70% 08/20/24 35 / 50 = 70.0 % 09/02/2024 31/50 62%  COGNITION: Overall cognitive status: Within functional limits for tasks assessed  SENSATION: Numbness and tingling in UE and LEs 0/4 on light touch sensation bilateral but able to feel deeper pressure sensation  POSTURE: rounded shoulders and forward head   CERVICAL ROM: * pain  Active ROM A/PROM (deg) eval AROM  08/08/24 AROM  08/20/24 A/ROM 09/02/2024  Flexion 22*  15   Extension 25*  22   Right lateral flexion 20*  12   Left lateral flexion 12*  15   Right rotation 30* 40  80  Left rotatio/n 30* 42  70   (Blank  row/s = not tested)    UPPER  EXTREMITY MMT: Grossly 4-/5 pain with shoulder abduction & flexion testing Grip strength to be tested    LOWER  EXTREMITY FFU:Hmnddob 4- to 4 /5 bilateral  (very weak hip flexors, abductors)   FUNCTIONAL TESTS:  5 times sit to stand: 23.40sec hands on knees  09/02/2024 14.09 sec some UE support  TREATMENT DATE:  09/12/24 Pt seen for aquatic therapy today.  Treatment took place in water  3.5-4.75 ft in depth at the [facility] pool. Temp of water was 91.  Pt entered/exited the pool via stairs with bil rail.  - walking forward/backward with reciprocal arm swing - side stepping with arm add/abdct with rainbow hand floats - suitcase carry with bil rainbow/ single yellow hand float under water, marching forward/backward - forward walking/backward walking with reciprocal row motion with yellow hand floats - bow and arrow with step back, x 10 each side  - UE On rainbow hand floats: leg swings hip abdct/add x 10; leg flexion/extension x 10 - staggered stance with full hollow noodle pull down to thighs x 10 each (some tingling in RUE) - return to walking with reciprocal arm swing - UE On yellow noodle: single leg forward leans x 8 each LE - cycling on yellow noodle with doggy paddle arms  [facility] Adult PT Treatment:                                                DATE:  09/10/2024 (Patient 10 mins late to appt) NuStep Level 4  - 5 mins- PT present to discuss status Cervical MELT method (flexion/extension & rotation) x 20 each  Foam roll along spine + shoulder abduction with green TB x 12 Foam roll along spine + shoulder D2 flexion with green TB x 12 bilateral  Hooklying alt hand and knee press with purple ball x 10 bilateral  Bridge + ball in between knees x 10  SL clamshell + TA activation 2 x 10 bilateral  Sit to stand holding 5# DB 2 x 10 Standing scaption + flexion 2# x 10 each  Cervical Mechanical Traction 18 lbs of force x 15 mins       09/02/2024 NuStep Level 4 6 mins- PT present to discuss status 5STS: 14.09 sec some UE support NDI: 21/50 42% ODI: 31/50 62% Cervical Mechanical Traction 18 lbs of force x 15 mins      08/29/24 Pt seen for aquatic therapy today.  Treatment took place in water 3.5-4.75 ft in depth at the [facility] pool. Temp of water was 91.  Pt entered/exited the pool via stairs with bil rail. - discussion regarding community pool temps and clothing options (pt put lower legs in lap pool - 85) - walking forward/backward with reciprocal arm swing - side stepping with arm add/abdct -> with medium resistance bells  - forward walking/backward walking with reciprocal row motion with medium resistance bells  - bow and arrow with step back, x 10 each side (improved cervical ROM) - UE On rainbow hand floats: hip abdct/add 3 x 5 - staggered stance with full hollow noodle pull down to thighs x 10 each  - UE On yellow noodle: single leg forward leans x 8 each LE - cycling on yellow noodle with doggy paddle arms  Pt requires the buoyancy and hydrostatic pressure of water for support, and to offload joints by unweighting joint load by at least 50 % in navel deep water and by at least 75-80% in chest to neck deep water.  Viscosity of the water is needed for resistance of strengthening. Water current perturbations provides challenge to standing balance requiring increased core activation.    08/26/2024 Updated on status Hooklying shoulder abduction with red TB 2 x 10 Hooklying D2 flexion with red TB 2 x 10 bilateral  Cervical MELT method (  flexion/extension & rotation) x 20 each  Hooklying TA activation + ball squeeze 2 x 10 TA activation + march x 10 bilateral  Standing shoulder extension + TA activation with green TB 2 x 10 Cervical Mechanical Traction 17 lbs of force x 15 mins      PATIENT EDUCATION:  Education details: intro to aquatic therapy  Person educated: Patient Education  method: Explanation, Demonstration Education comprehension: verbalized understanding, returned demonstration, and needs further education  HOME EXERCISE PROGRAM: Access Code: [email or web address] URL: [email or web address] Date: 08/05/2024 Prepared by: Kristeen Sar  Exercises - Seated Scapular Retraction  - 1 x daily - 7 x weekly - 1 sets - 10 reps - Seated Cervical Retraction  - 1 x daily - 7 x weekly - 1 sets - 10 reps - Sidelying Thoracic Rotation with Open Book  - 1 x daily - 7 x weekly - 1 sets - 10 reps - Supine Single Knee to Chest Stretch  - 1 x daily - 7 x weekly - 2 sets - 20-30 hold - Supine Gluteus Stretch  - 1 x daily - 7 x weekly - 2 sets - 20-30 hold - Standing Shoulder Row with Anchored Resistance  - 1 x daily - 7 x weekly - 2 sets - 10 reps - Shoulder extension with resistance - Neutral  - 1 x daily - 7 x weekly - 2 sets - 10 reps  AQUATIC Access Code: [email or web address] URL: [email or web address] Date: 09/12/2024 Prepared by: [facility] This aquatic home exercise program from [employer] utilizes pictures from land based exercises, but has been adapted prior to lamination and issuance.     ASSESSMENT:  CLINICAL IMPRESSION:  Discussed with pt gaining access to community pool to continue independently with aquatic HEP exercises. Pt was issued laminated HEP and given verbal instructions on it.  Pt has reached their max potential in aquatic setting and is ready to fully transition to land intervention.     From initial evaluation:  Patient is a 53 y.o. female who was seen today for physical therapy evaluation and treatment for neck and back pain. Yardley presents with chronic neck and back pain there interferes with her functional mobility and ability to perform work duties. Her cervical rotation is limited and painful and affects her ability to look in her blind spots. She reports having daily headaches that sometimes last multiple  days if she is having a bad flare up. She reports numbness and tingling in her upper and lower extremities that also affects her grip strength. Based on evaluation noted poor posture, muscle weakness, and decreased ROM. When testing light touch sensation patient did not feel any points on her thigh and lower leg bilaterally . She felt deep pressure sensation. Educated patient to return to her MD to get a nerve conduction study to see if she has neuropathy. She said she was going to get tested for it in the past, but never got it done. She understood the safety aspect involved of not having full sensation in her lower extremities. Patient will benefit from skilled PT to address the below impairments and improve overall function.   OBJECTIVE IMPAIRMENTS: decreased endurance, decreased mobility, difficulty walking, decreased ROM, decreased strength, hypomobility, increased fascial restrictions, increased muscle spasms, impaired flexibility, postural dysfunction, and pain.   ACTIVITY LIMITATIONS: carrying, lifting, bending, sitting, standing, squatting, sleeping, stairs, transfers, dressing, reach over head, and hygiene/grooming  PARTICIPATION LIMITATIONS: meal prep, cleaning, laundry, driving, shopping, community activity, and occupation  PERSONAL  FACTORS: Age, Fitness, Time since onset of injury/illness/exacerbation, and 1-2 comorbidities: OA are also affecting patient's functional outcome.   REHAB POTENTIAL: Good  CLINICAL DECISION MAKING: Evolving/moderate complexity  EVALUATION COMPLEXITY: Moderate   GOALS: Goals reviewed with patient? Yes  SHORT TERM GOALS: Target date: 08/13/2024  Patient will be independent with initial HEP. Baseline:  Goal status: MET 08/20/24  2.  Patient will report > or = to 30% improvement in sleep quality since starting PT. Baseline: disturbed for 3-4 hours Goal status: MET (60%) 09/02/2024  3.  Patient will demonstrate correct seated and standing posture  with no verbal cues to decrease mechanical strain. Baseline:  Goal status: MET 08/20/24  4.  Patient will be able to stand for at least 10 mins with < or = to 5/10 back pain for performance of ADLs and house chores.  Baseline: 5 mins Goal status:MET 09/02/2024   LONG TERM GOALS: Target date: 10/28/2024   Patient will demonstrate independence in advanced HEP. Baseline:  Goal status: IN PROGRESS 09/02/2024  2.  Patient will report > or = to 60% improvement in neck and back pain since starting PT. Baseline:  Goal status: MET (60%) 09/02/2024  3.  Patient will complete 5STS in < or= to 19 sec due to improved muscle strength and to be closer to age predicted norms. Baseline: 23.40 sec Goal status: MET 09/02/2024   4.  Patient to score < or = to 29/50 on NDI due to improved function and decreased disability.  Baseline: 34/50  Goal status: MET 09/02/2024  5.  Patient to score < or = to 25 on ODI due to decreased self perceived disability.  Baseline: 35/50 Goal status: IN PROGRESS (31/50) 09/02/2024  6.  Patient will demonstrate improved cervical ROM by 5-8 degrees to be able to look in her blind spot while driving. Baseline: see chart  Goal status: MET 09/02/2024  7.  Patient will be able to carry 7-8# unilaterally to help ability to carry in groceries.  Baseline:  Goal status: NEW  8.  Patient will be able to demonstrate proper lifting and squatting technique to prevent mechanical strain and help ability to perform home chores. Baseline:  Goal status: NEW  9.  Patient will verbalize 50% improvement in UE numbness/tingling for improved functional use of upper extremities. Baseline:  Goal status: NEW   PLAN:  PT FREQUENCY: 2x/week  PT DURATION: 8 weeks  PLANNED INTERVENTIONS: 97164- PT Re-evaluation, 97110-Therapeutic exercises, 97530- Therapeutic activity, 97112- Neuromuscular re-education, 97535- Self Care, 02859- Manual therapy, [phone]- Gait training, [phone]- Canalith  repositioning, [phone]- Aquatic Therapy, [phone]- Electrical stimulation (unattended), [phone]- Electrical stimulation (manual), [id number]- Vasopneumatic device, [id number]- Ultrasound, [id number]- Traction (mechanical), [id number]- Ionotophoresis 4mg /ml Dexamethasone, 20560 (1-2 muscles), 20561 (3+ muscles)- Dry Needling, Patient/Family education, Balance training, Stair training, Taping, Joint mobilization, Joint manipulation, Spinal manipulation, Spinal mobilization, Vestibular training, Cryotherapy, and Moist heat  PLAN FOR NEXT SESSION:   assess response UE tingling/ numbness;  lifting technique; cervical melt method  Delon Aquas, PTA 09/12/24 9:18 AM [facility] [address] [place], [place], [address] Phone: [phone]   Fax:  [phone]

## 2024-09-17 ENCOUNTER — Ambulatory Visit (HOSPITAL_BASED_OUTPATIENT_CLINIC_OR_DEPARTMENT_OTHER): Admitting: Physical Therapy

## 2024-09-17 ENCOUNTER — Ambulatory Visit: Admitting: Internal Medicine

## 2024-10-01 ENCOUNTER — Ambulatory Visit: Attending: Family Medicine | Admitting: Physical Therapy

## 2024-10-02 ENCOUNTER — Ambulatory Visit: Admitting: Physical Therapy

## 2024-10-07 ENCOUNTER — Ambulatory Visit: Attending: Family Medicine | Admitting: Physical Therapy

## 2024-10-07 ENCOUNTER — Encounter: Payer: Self-pay | Admitting: Physical Therapy

## 2024-10-07 DIAGNOSIS — M5459 Other low back pain: Secondary | ICD-10-CM

## 2024-10-07 DIAGNOSIS — R252 Cramp and spasm: Secondary | ICD-10-CM

## 2024-10-07 DIAGNOSIS — M542 Cervicalgia: Secondary | ICD-10-CM | POA: Diagnosis present

## 2024-10-07 DIAGNOSIS — M6281 Muscle weakness (generalized): Secondary | ICD-10-CM | POA: Diagnosis present

## 2024-10-07 NOTE — Therapy (Signed)
 OUTPATIENT PHYSICAL THERAPY CERVICAL/ LUMBAR TREATMENT   Patient Name: Christy Holland MRN: [id number] DOB:04/29/1971, 53 y.o., female Today's Date: 10/07/2024  END OF SESSION:  PT End of Session - 10/07/24 1231     Visit Number 15    Date for Recertification  10/28/24    Authorization Type [employer] Approved 4 vl, 09/10/2024 - 10/09/2024, auth# [id number]    Authorization Time Period 09/10/24-10/09/24    Authorization - Visit Number 2    Authorization - Number of Visits 4    PT Start Time 0941    PT Stop Time 1032    PT Time Calculation (min) 51 min    Activity Tolerance Patient tolerated treatment well    Behavior During Therapy [facility] for tasks assessed/performed              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/2017   Rhinitis, chronic 08/21/2017   Sinusitis 02/13/2018   Snoring 08/21/2017   Past Surgical History:  Procedure Laterality Date   PVC ABLATION N/A 06/18/2021   Procedure: PVC ABLATION;  Surgeon: Waddell Danelle ORN, MD;  Location: [facility] IN[facility]IVE [facility];  Service: Cardiovascular;  Laterality: N/A;   Patient Active Problem List   Diagnosis Date Noted   Menorrhagia 05/01/2024   Osteoarthritis of right knee 01/26/2024   Bilateral carpal tunnel syndrome 12/27/2023   Arthralgia of right knee 11/22/2023   Low back pain 11/14/2023   Neck pain 11/14/2023   Episodic recurrent vertigo 07/31/2023   Balance disorder 07/17/2023   Dizziness 07/17/2023   Carpal tunnel syndrome on both sides 04/04/2022   Chronic right shoulder pain 04/04/2022   Cervical radiculopathy 04/04/2022   Class 2 severe obesity due to excess calories with serious comorbidity and body mass index (BMI) of 36.0 to 36.9 in adult 03/17/2022   History of cardiac radiofrequency ablation 01/18/2022   Insomnia 11/12/2021   Mass of left breast  10/19/2021   Acne vulgaris 09/28/2021   Dyspnea on exertion 04/03/2021   Holter monitor, abnormal 08/22/2019   Frequent PVCs 03/01/2019   Sinusitis 02/13/2018   Chronic maxillary sinusitis 08/21/2017   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/21/2017   Rhinitis, chronic 08/21/2017   OSA (obstructive sleep apnea) 08/21/2017    PCP: Leonce Carola PARAS, PA-C   REFERRING PROVIDER: Faye Lauraine PARAS, FNP  REFERRING DIAG:  Diagnosis  M54.12 (ICD-10-CM) - Radiculopathy, cervical region    THERAPY DIAG:  Cervicalgia  Muscle weakness (generalized)  Other low back pain  Cramp and spasm  Rationale for Evaluation and Treatment: Rehabilitation  ONSET DATE: Chronic   SUBJECTIVE:  SUBJECTIVE STATEMENT:  Patient reports tingling in her first three fingers at night and during the day. She feels radiating pain down he arm coming from her neck. Pain is 5/10.  POOL ACCESS: currently none.    From initial evaluation:  Patient presents with chronic neck pain that hurts constantly. She has done injections and nerve blocks in the past. She reports having DDD and compressed nerves. It is more challenging looking to the right than the left. She reports numbness and tingling down her arms to all of her fingers. She reports differences in her grip strength and she drops objects. Pain impacts her sleep at night. She has daily headaches that last multiple days when she is having a bad flare up.She also reports chronic back pain that interferes with her standing and sitting tolerance. She has numbness and tingling in both of her legs that goes all the way down to her feet.  Hand dominance: Right  PERTINENT HISTORY:  Hx back neck and right shoulder pain; OA of right knee  PAIN:  Are you having pain? Yes:  NPRS scale: 4-5/ 10 not too bad Pain location: bilateral cervical spine starting at base of head; lower back at beltline Pain description: achy; sharp Aggravating factors: cervical rotation (Rt > Lt); driving; standing> 5 mins; sitting > 5-10 mins; walking> 5-69mins Relieving factors: Ice/ Heat; stretching    PRECAUTIONS: None  RED FLAGS: None     WEIGHT BEARING RESTRICTIONS: No  FALLS:  Has patient fallen in last 6 months? 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  DIAGNOSTIC FINDINGS:  Cervical Spine MR 12/29/2013 IMPRESSION:  1. Cervical spondylosis and degenerative disc disease, causing  moderate impingement at C5-6 and C6-7 as detailed above.   PATIENT SURVEYS:  EVAL NDI: 34/50 68% 08/20/24  34 / 50 = 68.0 % 09/02/2024 21/50 42% 10/07/2024 NDI:35/50 70%  Minimum Detectable Change (90% confidence): 5 points or 10% points  EVAL ODI: 35/50 70% 08/20/24 35 / 50 = 70.0 % 09/02/2024 31/50 62% 10/07/2024 ODI: 32/50 64%  COGNITION: Overall cognitive status: Within functional limits for tasks assessed  SENSATION: Numbness and tingling in UE and LEs 0/4 on light touch sensation bilateral but able to feel deeper pressure sensation  POSTURE: rounded shoulders and forward head   CERVICAL ROM: * pain  Active ROM A/PROM (deg) eval AROM  08/08/24 AROM  08/20/24 A/ROM 09/02/2024  Flexion 22*  15   Extension 25*   22   Right lateral flexion 20*  12   Left lateral flexion 12*  15   Right rotation 30* 40  80  Left rotatio/n 30* 42  70   (Blank row/s = not tested)    UPPER  EXTREMITY MMT: Grossly 4-/5 pain with shoulder abduction & flexion testing Grip strength to be tested    LOWER  EXTREMITY FFU:Hmnddob 4- to 4 /5 bilateral  (very weak hip flexors, abductors)   FUNCTIONAL TESTS:  5 times sit to stand: 23.40sec hands on knees  09/02/2024 14.09 sec  some UE support   TREATMENT DATE:  10/07/2024 Patient was late to appointment NuStep Level 6 6 mins- PT present to discuss status NDI & ODI see above Seated median nerve glides with sidebend x 10 on Rt  Supine chin tuck against towel 2 x 10 Cervical Mechanical Traction 18 lbs of force; min 8 lb x 15 mins    09/12/24 Pt seen for aquatic therapy today.  Treatment took place in water 3.5-4.75 ft in depth at the [facility] pool. Temp of water was 91.  Pt entered/exited the pool via stairs with bil rail.  - walking forward/backward with reciprocal arm swing - side stepping with arm add/abdct with rainbow hand floats - suitcase carry with bil rainbow/ single yellow hand float under water, marching forward/backward - forward walking/backward walking with reciprocal row motion with yellow hand floats - bow and arrow with step back, x 10 each side  - UE On rainbow hand floats: leg swings hip abdct/add x 10; leg flexion/extension x 10 - staggered stance with full hollow noodle pull down to thighs x 10 each (some tingling in RUE) - return to walking with reciprocal arm swing - UE On yellow noodle: single leg forward leans x 8 each LE - cycling on yellow noodle with doggy paddle arms  [facility] Adult PT Treatment:                                                DATE:  09/10/2024 (Patient 10 mins late to appt) NuStep Level 4  - 5 mins- PT present to discuss status Cervical MELT method (flexion/extension & rotation) x 20 each  Foam roll along  spine + shoulder abduction with green TB x 12 Foam roll along spine + shoulder D2 flexion with green TB x 12 bilateral  Hooklying alt hand and knee press with purple ball x 10 bilateral  Bridge + ball in between knees x 10  SL clamshell + TA activation 2 x 10 bilateral  Sit to stand holding 5# DB 2 x 10 Standing scaption + flexion 2# x 10 each  Cervical Mechanical Traction 18 lbs of force x 15 mins      09/02/2024 NuStep Level 4 6 mins- PT present to discuss status 5STS: 14.09 sec some UE support NDI: 21/50 42% ODI: 31/50 62% Cervical Mechanical Traction 18 lbs of force x 15 mins      08/29/24 Pt seen for aquatic therapy today.  Treatment took place in water 3.5-4.75 ft in depth at the [facility] pool. Temp of water was 91.  Pt entered/exited the pool via stairs with bil rail. - discussion regarding community pool temps and clothing options (pt put lower legs in lap pool - 85) - walking forward/backward with reciprocal arm swing - side stepping with arm add/abdct -> with medium resistance bells  - forward walking/backward walking with reciprocal row motion with medium resistance bells  - bow and arrow with step back, x 10 each side (improved cervical ROM) - UE On rainbow hand floats: hip abdct/add 3 x 5 - staggered stance with full hollow noodle pull down to thighs x 10 each  - UE On yellow noodle: single leg forward leans x 8 each LE - cycling on yellow noodle with doggy paddle arms  Pt requires the buoyancy and hydrostatic pressure of water for support, and to offload joints  by unweighting joint load by at least 50 % in navel deep water and by at least 75-80% in chest to neck deep water.  Viscosity of the water is needed for resistance of strengthening. Water current perturbations provides challenge to standing balance requiring increased core activation.      PATIENT EDUCATION:  Education details: intro to aquatic therapy  Person educated: Patient Education  method: Explanation, Demonstration Education comprehension: verbalized understanding, returned demonstration, and needs further education  HOME EXERCISE PROGRAM: Access Code: [email or web address] URL: [email or web address] Date: 10/07/2024 Prepared by: Kristeen Sar  Exercises - Seated Scapular Retraction  - 1 x daily - 7 x weekly - 1 sets - 10 reps - Seated Cervical Retraction  - 1 x daily - 7 x weekly - 1 sets - 10 reps - Sidelying Thoracic Rotation with Open Book  - 1 x daily - 7 x weekly - 1 sets - 10 reps - Supine Single Knee to Chest Stretch  - 1 x daily - 7 x weekly - 2 sets - 20-30 hold - Supine Gluteus Stretch  - 1 x daily - 7 x weekly - 2 sets - 20-30 hold - Standing Shoulder Row with Anchored Resistance  - 1 x daily - 7 x weekly - 2 sets - 10 reps - Shoulder extension with resistance - Neutral  - 1 x daily - 7 x weekly - 2 sets - 10 reps - Supine Cervical Retraction with Towel  - 1 x daily - 7 x weekly - 2 sets - 10 reps - Median Nerve Flossing - Tray  - 2 x daily - 7 x weekly - 1 sets - 5 reps  AQUATIC Access Code: [email or web address] URL: [email or web address] Date: 09/12/2024 Prepared by: [facility] - [address] This aquatic home exercise program from MedBridge utilizes pictures from land based exercises, but has been adapted prior to lamination and issuance.     ASSESSMENT:  CLINICAL IMPRESSION:  Christy Holland reports increased tingling and radiating pain down her left arm. These symptoms are worse during the day and at night. She drives buses and with the sustained shoulder flexion motions she notices increased tingling. She also reports new instances of dropping items due to arm weakness. Educated patient on the benefit of returning to the MD to discuss increased tingling and neck pain. Patient verbalized understanding and that she would follow up on this. Discussed joining [employer] or a community gym to continue aquatics exercises and overall  strengthening. Provided right start program informational sheet and referral paper. Her NDI and ODI scores slightly increased since last administration. Overall, she feels she is improving with skilled therapy and she is progressing slowly towards goals. Updated HEP to include exercise progressions. Patient would benefit from continued therapy to meet remaining goals and improve function.    From initial evaluation:  Patient is a 53 y.o. female who was seen today for physical therapy evaluation and treatment for neck and back pain. Christy Holland presents with chronic neck and back pain there interferes with her functional mobility and ability to perform work duties. Her cervical rotation is limited and painful and affects her ability to look in her blind spots. She reports having daily headaches that sometimes last multiple days if she is having a bad flare up. She reports numbness and tingling in her upper and lower extremities that also affects her grip strength. Based on evaluation noted poor posture, muscle weakness, and decreased ROM. When testing light touch sensation patient did not feel any points on  her thigh and lower leg bilaterally . She felt deep pressure sensation. Educated patient to return to her MD to get a nerve conduction study to see if she has neuropathy. She said she was going to get tested for it in the past, but never got it done. She understood the safety aspect involved of not having full sensation in her lower extremities. Patient will benefit from skilled PT to address the below impairments and improve overall function.   OBJECTIVE IMPAIRMENTS: decreased endurance, decreased mobility, difficulty walking, decreased ROM, decreased strength, hypomobility, increased fascial restrictions, increased muscle spasms, impaired flexibility, postural dysfunction, and pain.   ACTIVITY LIMITATIONS: carrying, lifting, bending, sitting, standing, squatting, sleeping, stairs, transfers, dressing, reach  over head, and hygiene/grooming  PARTICIPATION LIMITATIONS: meal prep, cleaning, laundry, driving, shopping, community activity, and occupation  PERSONAL FACTORS: Age, Fitness, Time since onset of injury/illness/exacerbation, and 1-2 comorbidities: OA are also affecting patient's functional outcome.   REHAB POTENTIAL: Good  CLINICAL DECISION MAKING: Evolving/moderate complexity  EVALUATION COMPLEXITY: Moderate   GOALS: Goals reviewed with patient? Yes  SHORT TERM GOALS: Target date: 08/13/2024  Patient will be independent with initial HEP. Baseline:  Goal status: MET 08/20/24  2.  Patient will report > or = to 30% improvement in sleep quality since starting PT. Baseline: disturbed for 3-4 hours Goal status: MET (60%) 09/02/2024  3.  Patient will demonstrate correct seated and standing posture with no verbal cues to decrease mechanical strain. Baseline:  Goal status: MET 08/20/24  4.  Patient will be able to stand for at least 10 mins with < or = to 5/10 back pain for performance of ADLs and house chores.  Baseline: 5 mins Goal status:MET 09/02/2024   LONG TERM GOALS: Target date: 10/28/2024   Patient will demonstrate independence in advanced HEP. Baseline:  Goal status: IN PROGRESS 10/07/2024  2.  Patient will report > or = to 60% improvement in neck and back pain since starting PT. Baseline:  Goal status: MET (60%) 09/02/2024  3.  Patient will complete 5STS in < or= to 19 sec due to improved muscle strength and to be closer to age predicted norms. Baseline: 23.40 sec Goal status: MET 09/02/2024   4.  Patient to score < or = to 29/50 on NDI due to improved function and decreased disability.  Baseline: 34/50  Goal status: MET 09/02/2024  5.  Patient to score < or = to 25 on ODI due to decreased self perceived disability.  Baseline: 35/50 Goal status: IN PROGRESS (31/50) 10/07/2024  6.  Patient will demonstrate improved cervical ROM by 5-8 degrees to be able to  look in her blind spot while driving. Baseline: see chart  Goal status: MET 09/02/2024  7.  Patient will be able to carry 7-8# unilaterally to help ability to carry in groceries.  Baseline:  Goal status: IN PROGRESS 10/07/2024  8.  Patient will be able to demonstrate proper lifting and squatting technique to prevent mechanical strain and help ability to perform home chores. Baseline:  Goal status: IN PROGRESS 10/07/2024  9.  Patient will verbalize 50% improvement in UE numbness/tingling for improved functional use of upper extremities. Baseline:  Goal status: IN PROGRESS (has increased some) 10/07/2024   PLAN:  PT FREQUENCY: 2x/week  PT DURATION: 8 weeks  PLANNED INTERVENTIONS: 97164- PT Re-evaluation, 97110-Therapeutic exercises, 97530- Therapeutic activity, [id number]- Neuromuscular re-education, 97535- Self Care, [id number]- Manual therapy, [id number]- Gait training, [phone]- Canalith repositioning, [id number]- Aquatic Therapy, H9716- Electrical stimulation (unattended), [id number]- Electrical stimulation (manual),  02983- Vasopneumatic device, [id number]- Ultrasound, 02987- Traction (mechanical), [id number]- Ionotophoresis 4mg /ml Dexamethasone, 79439 (1-2 muscles), 20561 (3+ muscles)- Dry Needling, Patient/Family education, Balance training, Stair training, Taping, Joint mobilization, Joint manipulation, Spinal manipulation, Spinal mobilization, Vestibular training, Cryotherapy, and Moist heat  PLAN FOR NEXT SESSION:   assess response UE tingling/ numbness;  lifting technique; cervical melt method; auth requested 12/15   Kristeen Sar, PT, DPT 10/07/2024 12:32 PM

## 2024-10-09 ENCOUNTER — Ambulatory Visit

## 2024-10-11 ENCOUNTER — Ambulatory Visit: Admitting: Physical Therapy

## 2024-10-14 ENCOUNTER — Encounter: Payer: Self-pay | Admitting: Physical Therapy

## 2024-10-14 ENCOUNTER — Ambulatory Visit: Admitting: Physical Therapy

## 2024-10-14 DIAGNOSIS — M5459 Other low back pain: Secondary | ICD-10-CM

## 2024-10-14 DIAGNOSIS — R252 Cramp and spasm: Secondary | ICD-10-CM

## 2024-10-14 DIAGNOSIS — M542 Cervicalgia: Secondary | ICD-10-CM

## 2024-10-14 DIAGNOSIS — M6281 Muscle weakness (generalized): Secondary | ICD-10-CM

## 2024-10-14 NOTE — Therapy (Signed)
 " OUTPATIENT PHYSICAL THERAPY CERVICAL/ LUMBAR TREATMENT   Patient Name: Christy Holland MRN: [id number] DOB:04-17-1971, 53 y.o., female Today's Date: 10/14/2024  END OF SESSION:  PT End of Session - 10/14/24 1407     Visit Number 16    Date for Recertification  10/28/24    Authorization Type Sherleen L[employer]AN [employer] Approved 6 vl, 10/14/2024 - 12/12/2024    Authorization - Visit Number 1    Authorization - Number of Visits 6    PT Start Time 1407    PT Stop Time 1457    PT Time Calculation (min) 50 min    Activity Tolerance Patient tolerated treatment well    Behavior During Therapy [facility] for tasks assessed/performed               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/2017   Rhinitis, chronic 08/21/2017   Sinusitis 02/13/2018   Snoring 08/21/2017   Past Surgical History:  Procedure Laterality Date   PVC ABLATION N/A 06/18/2021   Procedure: PVC ABLATION;  Surgeon: Waddell Danelle ORN, MD;  Location: [facility];  Service: Cardiovascular;  Laterality: N/A;   Patient Active Problem List   Diagnosis Date Noted   Menorrhagia 05/01/2024   Osteoarthritis of right knee 01/26/2024   Bilateral carpal tunnel syndrome 12/27/2023   Arthralgia of right knee 11/22/2023   Low back pain 11/14/2023   Neck pain 11/14/2023   Episodic recurrent vertigo 07/31/2023   Balance disorder 07/17/2023   Dizziness 07/17/2023   Carpal tunnel syndrome on both sides 04/04/2022   Chronic right shoulder pain 04/04/2022   Cervical radiculopathy 04/04/2022   Class 2 severe obesity due to excess calories with serious comorbidity and body mass index (BMI) of 36.0 to 36.9 in adult 03/17/2022   History of cardiac radiofrequency ablation 01/18/2022   Insomnia 11/12/2021   Mass of left breast 10/19/2021   Acne vulgaris 09/28/2021   Dyspnea on exertion 04/03/2021    Holter monitor, abnormal 08/22/2019   Frequent PVCs 03/01/2019   Sinusitis 02/13/2018   Chronic maxillary sinusitis 08/21/2017   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/21/2017   Rhinitis, chronic 08/21/2017   OSA (obstructive sleep apnea) 08/21/2017    PCP: Leonce Carola PARAS, PA-C   REFERRING PROVIDER: Faye Lauraine PARAS, FNP  REFERRING DIAG:  Diagnosis  M54.12 (ICD-10-CM) - Radiculopathy, cervical region    THERAPY DIAG:  Cervicalgia  Muscle weakness (generalized)  Other low back pain  Cramp and spasm  Rationale for Evaluation and Treatment: Rehabilitation  ONSET DATE: Chronic   SUBJECTIVE:  SUBJECTIVE STATEMENT:  Increased LBP over the weekend when she was out running errands. No pain to day. Ongoing tingling in her first three fingers during the day and worse at night. Also starting to feel intermittently into digits 4-5.    POOL ACCESS: currently none.    From initial evaluation:  Patient presents with chronic neck pain that hurts constantly. She has done injections and nerve blocks in the past. She reports having DDD and compressed nerves. It is more challenging looking to the right than the left. She reports numbness and tingling down her arms to all of her fingers. She reports differences in her grip strength and she drops objects. Pain impacts her sleep at night. She has daily headaches that last multiple days when she is having a bad flare up.She also reports chronic back pain that interferes with her standing and sitting tolerance. She has numbness and tingling in both of her legs that goes all the way down to her feet.  Hand dominance: Right  PERTINENT HISTORY:  Hx back neck and right shoulder pain; OA of right knee  PAIN:  Are you having pain? Yes: NPRS scale:  3/ 10  Pain location: bilateral cervical spine starting at base of head; lower back at beltline Pain description: achy; sharp Aggravating factors: cervical rotation (Rt > Lt); driving; standing> 5 mins; sitting > 5-10 mins; walking> 5-62mins Relieving factors: Ice/ Heat; stretching    PRECAUTIONS: None  RED FLAGS: None     WEIGHT BEARING RESTRICTIONS: No  FALLS:  Has patient fallen in last 6 months? 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  DIAGNOSTIC FINDINGS:  Cervical Spine MR 12/29/2013 IMPRESSION:  1. Cervical spondylosis and degenerative disc disease, causing  moderate impingement at C5-6 and C6-7 as detailed above.   PATIENT SURVEYS:  EVAL NDI: 34/50 68% 08/20/24  34 / 50 = 68.0 % 09/02/2024 21/50 42% 10/07/2024 NDI:35/50 70%  Minimum Detectable Change (90% confidence): 5 points or 10% points  EVAL ODI: 35/50 70% 08/20/24 35 / 50 = 70.0 % 09/02/2024 31/50 62% 10/07/2024 ODI: 32/50 64%  COGNITION: Overall cognitive status: Within functional limits for tasks assessed  SENSATION: Numbness and tingling in UE and LEs 0/4 on light touch sensation bilateral but able to feel deeper pressure sensation  POSTURE: rounded shoulders and forward head   CERVICAL ROM: * pain  Active ROM A/PROM (deg) eval AROM  08/08/24 AROM  08/20/24 A/ROM 09/02/2024  Flexion 22*  15   Extension 25*  22   Right lateral  flexion 20*  12   Left lateral flexion 12*  15   Right rotation 30* 40  80  Left rotatio/n 30* 42  70   (Blank row/s = not tested)    UPPER  EXTREMITY MMT: Grossly 4-/5 pain with shoulder abduction & flexion testing Grip strength to be tested    LOWER  EXTREMITY FFU:Hmnddob 4- to 4 /5 bilateral  (very weak hip flexors, abductors)   FUNCTIONAL TESTS:  5 times sit to  stand: 23.40sec hands on knees  09/02/2024 14.09 sec some UE support   TREATMENT DATE:   10/14/2024  NuStep Level 5  - 5 mins- PT present to discuss status Seated median nerve glides with sidebend x 10 on Rt Tried ulnar glide but wrist extension limited Seated wrist ext stretch x30 sec B; Also did on hands and knees with rocking Seated with wrist in extension then forearm pro/sup x 10 Ulnar glide in prayer position - x 10 increases tingling Doorway stretch 2x30 sec at 90 deg  Standing scaption, flex and abd 2# x 10 each  Discussed neutral spine posturing   Cervical Mechanical Traction 23 lbs of force x 15 mins   10/07/2024 Patient was late to appointment NuStep Level 6 6 mins- PT present to discuss status NDI & ODI see above Seated median nerve glides with sidebend x 10 on Rt  Supine chin tuck against towel 2 x 10 Cervical Mechanical Traction 18 lbs of force; min 8 lb x 15 mins    09/12/24 Pt seen for aquatic therapy today.  Treatment took place in water 3.5-4.75 ft in depth at the [facility] pool. Temp of water was 91.  Pt entered/exited the pool via stairs with bil rail.  - walking forward/backward with reciprocal arm swing - side stepping with arm add/abdct with rainbow hand floats - suitcase carry with bil rainbow/ single yellow hand float under water, marching forward/backward - forward walking/backward walking with reciprocal row motion with yellow hand floats - bow and arrow with step back, x 10 each side  - UE On rainbow hand floats: leg swings hip abdct/add x 10; leg  flexion/extension x 10 - staggered stance with full hollow noodle pull down to thighs x 10 each (some tingling in RUE) - return to walking with reciprocal arm swing - UE On yellow noodle: single leg forward leans x 8 each LE - cycling on yellow noodle with doggy paddle arms  [facility] Adult PT Treatment:                                                DATE:  09/10/2024 (Patient 10 mins late to appt) NuStep Level 4  - 5 mins- PT present to discuss status Cervical MELT method (flexion/extension & rotation) x 20 each  Foam roll along spine + shoulder abduction with green TB x 12 Foam roll along spine + shoulder D2 flexion with green TB x 12 bilateral  Hooklying alt hand and knee press with purple ball x 10 bilateral  Bridge + ball in between knees x 10  SL clamshell + TA activation 2 x 10 bilateral  Sit to stand holding 5# DB 2 x 10 Standing scaption + flexion 2# x 10 each  Cervical Mechanical Traction 18 lbs of force x 15 mins    PATIENT EDUCATION:  Education details: intro to aquatic therapy  Person educated: Patient Education method: Programmer, Multimedia, Demonstration Education comprehension: verbalized understanding, returned demonstration, and needs further education  HOME EXERCISE PROGRAM: Access Code: [email or web address] Access Code: [email or web address] URL: [email or web address] Date: 10/14/2024 Prepared by: Mliss  Exercises - Seated Scapular Retraction  - 1 x daily - 7 x weekly - 1 sets - 10 reps - Seated Cervical Retraction  - 1 x daily - 7 x weekly - 1 sets - 10 reps - Sidelying Thoracic Rotation with Open Book  -  1 x daily - 7 x weekly - 1 sets - 10 reps - Supine Single Knee to Chest Stretch  - 1 x daily - 7 x weekly - 2 sets - 20-30 hold - Supine Gluteus Stretch  - 1 x daily - 7 x weekly - 2 sets - 20-30 hold - Standing Shoulder Row with Anchored Resistance  - 1 x daily - 7 x weekly - 2 sets - 10 reps - Shoulder extension with resistance - Neutral  - 1 x daily - 7 x weekly - 2 sets - 10  reps - Supine Cervical Retraction with Towel  - 1 x daily - 7 x weekly - 2 sets - 10 reps - Median Nerve Flossing - Tray  - 2 x daily - 7 x weekly - 1 sets - 5 reps - Ulnar Nerve Flossing  - 2 x daily - 7 x weekly - 1 sets - 10 reps - Wrist Flexor Stretch in Pronation  - 2 x daily - 7 x weekly - 1 sets - 3 reps - 30 sec hold  AQUATIC Access Code: [email or web address] URL: [email or web address] Date: 09/12/2024 Prepared by: [facility] - [address] This aquatic home exercise program from MedBridge utilizes pictures from land based exercises, but has been adapted prior to lamination and issuance.     ASSESSMENT:  CLINICAL IMPRESSION:   Kensley reports ongoing tingling and radiating pain down her left arm. These symptoms are worse during the day and especially at night. She is feeling the tingling in to digits 4 and 5 now as well. She is very tight in bilateral wrist flexors and in her pectoralis muscles so we worked on this today. She has increased tingling with nerve glides in the prayer position, but had a negative Tinel's sign. Issued nerve glide with hopes that symptoms will become less as she does them. Able to increase traction pull today as well which she continues to report relief with. We discussed neutral spine posturing when standing as she demonstrates increased lordosis and reported increased low back pain yesterday with errands.   From initial evaluation:  Patient is a 53 y.o. female who was seen today for physical therapy evaluation and treatment for neck and back pain. Thalia presents with chronic neck and back pain there interferes with her functional mobility and ability to perform work duties. Her cervical rotation is limited and painful and affects her ability to look in her blind spots. She reports having daily headaches that sometimes last multiple days if she is having a bad flare up. She reports numbness and tingling in her upper and lower extremities  that also affects her grip strength. Based on evaluation noted poor posture, muscle weakness, and decreased ROM. When testing light touch sensation patient did not feel any points on her thigh and lower leg bilaterally . She felt deep pressure sensation. Educated patient to return to her MD to get a nerve conduction study to see if she has neuropathy. She said she was going to get tested for it in the past, but never got it done. She understood the safety aspect involved of not having full sensation in her lower extremities. Patient will benefit from skilled PT to address the below impairments and improve overall function.   OBJECTIVE IMPAIRMENTS: decreased endurance, decreased mobility, difficulty walking, decreased ROM, decreased strength, hypomobility, increased fascial restrictions, increased muscle spasms, impaired flexibility, postural dysfunction, and pain.   ACTIVITY LIMITATIONS: carrying, lifting, bending, sitting, standing, squatting, sleeping, stairs, transfers, dressing,  reach over head, and hygiene/grooming  PARTICIPATION LIMITATIONS: meal prep, cleaning, laundry, driving, shopping, community activity, and occupation  PERSONAL FACTORS: Age, Fitness, Time since onset of injury/illness/exacerbation, and 1-2 comorbidities: OA are also affecting patient's functional outcome.   REHAB POTENTIAL: Good  CLINICAL DECISION MAKING: Evolving/moderate complexity  EVALUATION COMPLEXITY: Moderate   GOALS: Goals reviewed with patient? Yes  SHORT TERM GOALS: Target date: 08/13/2024  Patient will be independent with initial HEP. Baseline:  Goal status: MET 08/20/24  2.  Patient will report > or = to 30% improvement in sleep quality since starting PT. Baseline: disturbed for 3-4 hours Goal status: MET (60%) 09/02/2024  3.  Patient will demonstrate correct seated and standing posture with no verbal cues to decrease mechanical strain. Baseline:  Goal status: MET 08/20/24  4.  Patient will  be able to stand for at least 10 mins with < or = to 5/10 back pain for performance of ADLs and house chores.  Baseline: 5 mins Goal status:MET 09/02/2024   LONG TERM GOALS: Target date: 10/28/2024   Patient will demonstrate independence in advanced HEP. Baseline:  Goal status: IN PROGRESS 10/07/2024  2.  Patient will report > or = to 60% improvement in neck and back pain since starting PT. Baseline:  Goal status: MET (60%) 09/02/2024  3.  Patient will complete 5STS in < or= to 19 sec due to improved muscle strength and to be closer to age predicted norms. Baseline: 23.40 sec Goal status: MET 09/02/2024   4.  Patient to score < or = to 29/50 on NDI due to improved function and decreased disability.  Baseline: 34/50  Goal status: MET 09/02/2024  5.  Patient to score < or = to 25 on ODI due to decreased self perceived disability.  Baseline: 35/50 Goal status: IN PROGRESS (31/50) 10/07/2024  6.  Patient will demonstrate improved cervical ROM by 5-8 degrees to be able to look in her blind spot while driving. Baseline: see chart  Goal status: MET 09/02/2024  7.  Patient will be able to carry 7-8# unilaterally to help ability to carry in groceries.  Baseline:  Goal status: IN PROGRESS 10/07/2024  8.  Patient will be able to demonstrate proper lifting and squatting technique to prevent mechanical strain and help ability to perform home chores. Baseline:  Goal status: IN PROGRESS 10/07/2024  9.  Patient will verbalize 50% improvement in UE numbness/tingling for improved functional use of upper extremities. Baseline:  Goal status: IN PROGRESS (has increased some) 10/07/2024   PLAN:  PT FREQUENCY: 2x/week  PT DURATION: 8 weeks  PLANNED INTERVENTIONS: 97164- PT Re-evaluation, 97110-Therapeutic exercises, 97530- Therapeutic activity, 97112- Neuromuscular re-education, 97535- Self Care, 02859- Manual therapy, [phone]- Gait training, [phone]- Canalith repositioning, [id number]- Aquatic  Therapy, [phone]- Electrical stimulation (unattended), [phone]- Electrical stimulation (manual), [id number]- Vasopneumatic device, [id number]- Ultrasound, [id number]- Traction (mechanical), [id number]- Ionotophoresis 4mg /ml Dexamethasone, 79439 (1-2 muscles), 20561 (3+ muscles)- Dry Needling, Patient/Family education, Balance training, Stair training, Taping, Joint mobilization, Joint manipulation, Spinal manipulation, Spinal mobilization, Vestibular training, Cryotherapy, and Moist heat  PLAN FOR NEXT SESSION:   assess response UE tingling/ numbness;  lifting technique; cervical melt method; auth requested 12/15   Mliss Cummins, PT  10/14/2024 5:06 PM   "

## 2024-10-14 NOTE — Progress Notes (Signed)
 HPI F never smoker followed for Insomnia, Minimal OSA( 6.6/ hr)/ Snoring,  complicated by Dyspnea on exertion, PVCs/ Ablation, Chronic Rhinitis, Chronic Maxillary /sinusitis, Tonsil Hypertrophy, Obesity,  HST ([facility]) 05/08/2019- AHI 6.6/ hr, body weight 218 lbs PFT 05/08/24- WNL except DLCO relatively increased for lung volume     Done for c/o dyspnea on exertion ===============================================================      08/06/24- 53 yoF never smoker followed for Insomnia, Minimal OSA( 6.6/ hr)/ Snoring,  complicated by Dyspnea on exertion, PVCs/ Ablation, Chronic Rhinitis, Chronic Maxillary /sinusitis, Tonsil Hypertrophy, Obesity,  -Clonazepam  0.5 mg 1-4 tabs CPAP  auto 5-11/ [employer]  Dream Station Auto  Download - compliance  - Body weight today-148 lbs (significant weight loss, but stable since last year) We gave Trelegy sample for DOE last visit. Discussed the use of AI scribe software for clinical note transcription with the patient, who gave verbal consent to proceed.  History of Present Illness   Christy Holland is a 53 year old female with sleep apnea who presents for evaluation of her CPAP machine and shortness of breath.  She has lost significant weight, decreasing from 250 pounds to approximately 148 pounds since her initial sleep studies. Her CPAP machine, a DreamStation, has exceeded its motor lifespan and requires replacement.  She experiences shortness of breath on exertion and has used a sample of Trelegy inhaler, though it had not resulted in major changes in her breathing. She currently does not have Trelegy as she only received a sample. We discussed using a rescue inhaler for episodic care, to see if that suited her needs at lower cost.  She inquires about the potential impact of mold exposure on her respiratory symptoms, particularly in relation to water damage. We discussed mold in home and role of dehumidifier.      CXR 05/28/24 IMPRESSION: No active  cardiopulmonary disease.    Assessment and Plan:    Obstructive sleep apnea Significant weight loss may impact severity. Current machine exceeds motor lifespan but is beneficial. - Request new sleep apnea machine to replace DreamStation. - Advise to report any discomfort with new machine for adjustments.  Shortness of breath Intermittent on exertion. Previous Trelegy trial. Albuterol  trial to assess need and effectiveness. Discussed environmental triggers. - Prescribe albuterol  rescue inhaler as needed. - Instruct on albuterol  use before exertion or irritant exposure. - Discuss albuterol  side effects, including mild tremor. - Advise on mold control measures, such as dehumidifiers. - Offer flu shot.   10/15/24-  53 yoF never smoker followed for Insomnia, Minimal OSA( 6.6/ hr)/ Snoring,  complicated by Dyspnea on exertion, PVCs/ Ablation, Chronic Rhinitis, Chronic Maxillary /sinusitis, Tonsil Hypertrophy, Obesity,  -Clonazepam  0.5 mg 1-4 tabs CPAP  auto 5-11/ [employer]  Dream Station [employer] - compliance  - reports using CPAP, but SD card was blank last time so she didn't bring it today. Body weight today-150 lbs Discussed the use of AI scribe software for clinical note transcription with the patient, who gave verbal consent to proceed.  History of Present Illness   Christy Holland is a 53 year old female with sleep apnea and insomnia who presents with sinus infection symptoms impacting CPAP use.  For the past week she has had sinus symptoms with headache, sore throat, and purulent nasal discharge. Saline nasal rinses and allergy medications have not helped. Sinus pressure is worsened by CPAP use and is making CPAP difficult to tolerate.  She uses a DreamStation CPAP that is about 53 years old and was  recalled for polyurethane foam breakdown, but she has continued to use it.  She will be started on a Z-Pak for the sinus infection. She uses an albuterol  inhaler as needed for  infrequent wheezing and cough. She takes clonazepam  for sleep and asks about alternatives to improve sleep quality and daytime alertness, including prior Lunesta use and the role of caffeine.  She asks about Trelegy as a possible inhaler option, but cost has been a concern.     Assessment and Plan:    Acute sinusitis Symptoms persistent despite saline rinses and allergy medications. Z-Pak chosen for ease on stomach and to avoid resistance. - Prescribed Z-Pak (azithromycin ) for sinusitis. - Recommended Sudafed for decongestion.  Obstructive sleep apnea- benefits fom CPAP Current CPAP machine is a five-year-old DreamStation. Discussed recall issues and replacement options. - Ordered replacement CPAP machine from [employer].  Insomnia Managed with clonazepam . Discussed alternatives if clonazepam  is insufficient. - Refilled clonazepam  prescription. - Discussed alternative sleep aids such as trazodone  and Lunesta.  Asthma- mild intermittent uncomplicated Occasional wheezing and coughing. Discussed cost-effectiveness of standard albuterol . - Prescribed albuterol  inhaler for asthma management.     ROS-see HPI   + = positive Constitutional:    +weight loss, night sweats, fevers, chills, f+atigue, lassitude. HEENT:    +headaches, difficulty swallowing, tooth/dental problems, sore throat,       sneezing, itching, ear ache, +nasal congestion, post nasal drip, snoring CV:    chest pain, orthopnea, PND, swelling in lower extremities, anasarca,                                   dizziness, palpitations Resp:   shortness of breath with exertion or at rest.                productive cough,   non-productive cough, coughing up of blood.              change in color of mucus.  wheezing.   Skin:    rash or lesions. GI:  No-   heartburn, indigestion, abdominal pain, nausea, vomiting, diarrhea,                 change in bowel habits, loss of appetite GU: dysuria, change in color of urine, no urgency or  frequency.   flank pain. MS:   joint pain, stiffness, decreased range of motion, back pain. Neuro-     nothing unusual Psych:  change in mood or affect.  depression or anxiety.   memory loss.  OBJ- Physical Exam General- Alert, Oriented, Affect-appropriate, Distress- none acute, Slender- note significant weight loss since 2022( Ozempic) Skin- rash-none, lesions- none, excoriation- none Lymphadenopathy- none Head- atraumatic            Eyes- Gross vision intact, PERRLA, conjunctivae and secretions clear            Ears- Hearing, canals-normal            Nose-  no-Septal dev, mucus, polyps, erosion, perforation             Throat- Mallampati II , mucosa clear , drainage- none, tonsils- atrophic, + teeth Neck- flexible , trachea midline, no stridor , thyroid  nl, carotid no bruit Chest - symmetrical excursion , unlabored           Heart/CV- RRR , no murmur , no gallop  , no rub, nl s1 s2                           -  JVD- none , edema- none, stasis changes- none, varices- none           Lung- clear to P&A, wheeze- none, cough- none , dullness-none, rub- none           Chest wall-  Abd-  Br/ Gen/ Rectal- Not done, not indicated Extrem- cyanosis- none, clubbing, none, atrophy- none, strength- nl Neuro- grossly intact to observation

## 2024-10-15 ENCOUNTER — Ambulatory Visit: Admitting: Internal Medicine

## 2024-10-15 ENCOUNTER — Encounter: Payer: Self-pay | Admitting: Internal Medicine

## 2024-10-15 VITALS — BP 114/58 | HR 71 | Temp 98.0°F | Ht 67.0 in | Wt 150.6 lb

## 2024-10-15 DIAGNOSIS — G4700 Insomnia, unspecified: Secondary | ICD-10-CM

## 2024-10-15 DIAGNOSIS — G4733 Obstructive sleep apnea (adult) (pediatric): Secondary | ICD-10-CM

## 2024-10-15 DIAGNOSIS — J4520 Mild intermittent asthma, uncomplicated: Secondary | ICD-10-CM

## 2024-10-15 DIAGNOSIS — J019 Acute sinusitis, unspecified: Secondary | ICD-10-CM

## 2024-10-15 MED ORDER — ALBUTEROL SULFATE HFA 108 (90 BASE) MCG/ACT IN AERS: 2.0000 | INHALATION_SPRAY | Freq: Four times a day (QID) | RESPIRATORY_TRACT | 6 refills | Status: AC | PRN

## 2024-10-15 MED ORDER — AZITHROMYCIN 250 MG PO TABS: ORAL_TABLET | ORAL | 0 refills | Status: DC

## 2024-10-15 MED ORDER — CLONAZEPAM 1 MG PO TABS: 1.0000 mg | ORAL_TABLET | Freq: Every day | ORAL | 5 refills | Status: AC

## 2024-10-15 NOTE — Patient Instructions (Signed)
 Order- DME [facility]- please replace old PAP machine auto 5-15, mask of choice, humidifier, supplies, airView/ card  Script sent for Zpak antibiotic, clonazepam , albuterol  inhaler  At checkout, ask the front desk to bring you back with one of our sleep doctors in about 3 months

## 2024-10-16 ENCOUNTER — Ambulatory Visit: Admitting: Physical Therapy

## 2024-10-22 ENCOUNTER — Ambulatory Visit: Admitting: Physical Therapy

## 2024-10-23 ENCOUNTER — Ambulatory Visit: Admitting: Physical Therapy

## 2024-10-27 ENCOUNTER — Encounter: Payer: Self-pay | Admitting: Internal Medicine

## 2024-10-28 ENCOUNTER — Encounter: Payer: Self-pay | Admitting: Physical Therapy

## 2024-10-28 ENCOUNTER — Ambulatory Visit: Admitting: Physical Therapy

## 2024-10-30 ENCOUNTER — Ambulatory Visit: Payer: Self-pay | Admitting: Psychology

## 2024-11-06 ENCOUNTER — Ambulatory Visit: Attending: Family Medicine

## 2024-11-06 DIAGNOSIS — M542 Cervicalgia: Secondary | ICD-10-CM | POA: Diagnosis present

## 2024-11-06 DIAGNOSIS — M6281 Muscle weakness (generalized): Secondary | ICD-10-CM | POA: Diagnosis present

## 2024-11-06 DIAGNOSIS — R252 Cramp and spasm: Secondary | ICD-10-CM | POA: Insufficient documentation

## 2024-11-06 DIAGNOSIS — M5459 Other low back pain: Secondary | ICD-10-CM | POA: Diagnosis present

## 2024-11-06 NOTE — Therapy (Signed)
 " OUTPATIENT PHYSICAL THERAPY CERVICAL/ LUMBAR TREATMENT   Patient Name: DNIYA NEUHAUS MRN: [id number] DOB:24-Jan-1971, 54 y.o., female Today's Date: 11/06/2024  END OF SESSION:  PT End of Session - 11/06/24 1534     Visit Number 17    Authorization Type [employer] Approved 6 vl, 10/14/2024 - 12/12/2024    Authorization - Visit Number 2    Authorization - Number of Visits 6    PT Start Time 1451    PT Stop Time 1540    PT Time Calculation (min) 49 min    Activity Tolerance Patient tolerated treatment well    Behavior During Therapy [facility] for tasks assessed/performed                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/2017   Rhinitis, chronic 08/21/2017   Sinusitis 02/13/2018   Snoring 08/21/2017   Past Surgical History:  Procedure Laterality Date   PVC ABLATION N/A 06/18/2021   Procedure: PVC ABLATION;  Surgeon: Waddell Danelle ORN, MD;  Location: [facility];  Service: Cardiovascular;  Laterality: N/A;   Patient Active Problem List   Diagnosis Date Noted   Menorrhagia 05/01/2024   Osteoarthritis of right knee 01/26/2024   Bilateral carpal tunnel syndrome 12/27/2023   Arthralgia of right knee 11/22/2023   Low back pain 11/14/2023   Neck pain 11/14/2023   Episodic recurrent vertigo 07/31/2023   Balance disorder 07/17/2023   Dizziness 07/17/2023   Carpal tunnel syndrome on both sides 04/04/2022   Chronic right shoulder pain 04/04/2022   Cervical radiculopathy 04/04/2022   Class 2 severe obesity due to excess calories with serious comorbidity and body mass index (BMI) of 36.0 to 36.9 in adult 03/17/2022   History of cardiac radiofrequency ablation 01/18/2022   Insomnia 11/12/2021   Mass of left breast 10/19/2021   Acne vulgaris 09/28/2021   Dyspnea on exertion 04/03/2021   Holter monitor, abnormal  08/22/2019   Frequent PVCs 03/01/2019   Sinusitis 02/13/2018   Chronic maxillary sinusitis 08/21/2017   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/21/2017   Rhinitis, chronic 08/21/2017   OSA (obstructive sleep apnea) 08/21/2017    PCP: Leonce Carola PARAS, PA-C   REFERRING PROVIDER: Faye Lauraine PARAS, FNP  REFERRING DIAG:  Diagnosis  M54.12 (ICD-10-CM) - Radiculopathy, cervical region    THERAPY DIAG:  Cervicalgia - Plan: PT plan of care cert/re-cert  Muscle weakness (generalized) - Plan: PT plan of care cert/re-cert  Other low back pain - Plan: PT plan of care cert/re-cert  Cramp and spasm - Plan: PT plan of care cert/re-cert  Rationale for Evaluation and Treatment: Rehabilitation  ONSET DATE: Chronic   SUBJECTIVE:  SUBJECTIVE STATEMENT:  My back feels 60-70% better, my arm pain is still present. Traction helps.     POOL ACCESS: currently none.    From initial evaluation:  Patient presents with chronic neck pain that hurts constantly. She has done injections and nerve blocks in the past. She reports having DDD and compressed nerves. It is more challenging looking to the right than the left. She reports numbness and tingling down her arms to all of her fingers. She reports differences in her grip strength and she drops objects. Pain impacts her sleep at night. She has daily headaches that last multiple days when she is having a bad flare up.She also reports chronic back pain that interferes with her standing and sitting tolerance. She has numbness and tingling in both of her legs that goes all the way down to her feet.  Hand dominance: Right  PERTINENT HISTORY:  Hx back neck and right shoulder pain; OA of right knee  PAIN: 11/06/24 Are you having pain? Yes: NPRS scale: 5/ 10   Pain location: bilateral cervical spine starting at base of head; lower back at beltline Pain description: achy; sharp Aggravating factors: cervical rotation (Rt > Lt); driving; standing> 5 mins; sitting > 5-10 mins; walking> 5-65mins Relieving factors: Ice/ Heat; stretching    PRECAUTIONS: None  RED FLAGS: None     WEIGHT BEARING RESTRICTIONS: No  FALLS:  Has patient fallen in last 6 months? 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  DIAGNOSTIC FINDINGS:  Cervical Spine MR 12/29/2013 IMPRESSION:  1. Cervical spondylosis and degenerative disc disease, causing  moderate impingement at C5-6 and C6-7 as detailed above.   PATIENT SURVEYS:  EVAL NDI: 34/50 68% 08/20/24  34 / 50 = 68.0 % 09/02/2024 21/50 42% 10/07/2024 NDI:35/50 70%  Minimum Detectable Change (90% confidence): 5 points or 10% points  EVAL ODI: 35/50 70% 08/20/24 35 / 50 = 70.0 % 09/02/2024 31/50 62% 10/07/2024 ODI: 32/50 64%  COGNITION: Overall cognitive status: Within functional limits for tasks assessed  SENSATION: Numbness and tingling in UE and LEs 0/4 on light touch sensation bilateral but able to feel deeper pressure sensation  POSTURE: rounded shoulders and forward head   CERVICAL ROM: * pain  Active ROM A/PROM (deg) eval AROM  08/08/24 AROM  08/20/24 A/ROM 09/02/2024  Flexion 22*  15   Extension 25*  22   Right lateral flexion 20*   12   Left lateral flexion 12*  15   Right rotation 30* 40  80  Left rotatio/n 30* 42  70   (Blank row/s = not tested)    UPPER  EXTREMITY MMT: Grossly 4-/5 pain with shoulder abduction & flexion testing  11/06/24:  Grip strength: Lt  24#  Rt 3# Rt shoulder: flexion 3+/5, abduction 4-/5, Lt shoulder 4+/5   LOWER  EXTREMITY FFU:Hmnddob 4- to 4 /5 bilateral  (very weak hip flexors, abductors)   FUNCTIONAL TESTS:  5 times sit to stand: 23.40sec hands on knees  09/02/2024 14.09 sec  some UE support   TREATMENT DATE:   11/06/2024 NuStep Level 5  - 5 mins- PT present to discuss status Seated hamstring stretch 2x20 seconds  Doorway stretch 2x30 sec at 90 deg  Standing scaption, flex and abd 2#  2x10 each  Supine: horizontal abduction and ER with red band 2x10 Discussed Rt UE radiculopathy and progressive strength deficits   Cervical Mechanical Traction 23 lbs of force x 15 mins    10/14/2024  NuStep Level 5  - 5 mins- PT present to discuss status Seated median nerve glides with sidebend x 10 on Rt Tried ulnar glide but wrist extension limited Seated wrist ext stretch x30 sec B; Also did on hands and knees with rocking Seated with wrist in extension then forearm pro/sup x 10 Ulnar glide in prayer position - x 10 increases tingling Doorway stretch 2x30 sec at 90 deg  Standing scaption, flex and abd 2# x 10 each  Discussed neutral spine posturing   Cervical Mechanical Traction 23 lbs of force x 15 mins   10/07/2024 Patient was late to appointment NuStep Level 6 6 mins- PT present to discuss status NDI & ODI see above Seated median nerve glides with sidebend x 10 on Rt  Supine chin tuck against towel 2 x 10 Cervical Mechanical Traction 18 lbs of force; min 8 lb x 15 mins       PATIENT EDUCATION:  Education details: intro to aquatic therapy  Person educated: Patient Education method: Programmer, Multimedia, Demonstration Education comprehension: verbalized understanding,  returned demonstration, and needs further education  HOME EXERCISE PROGRAM: Access Code: [email or web address] Access Code: [email or web address] URL: [email or web address] Date: 10/14/2024 Prepared by: Mliss  Exercises - Seated Scapular Retraction  - 1 x daily - 7 x weekly - 1 sets - 10 reps - Seated Cervical Retraction  - 1 x daily - 7 x weekly - 1 sets - 10 reps - Sidelying Thoracic Rotation with Open Book  - 1 x daily - 7 x weekly - 1 sets - 10 reps - Supine Single Knee to Chest Stretch  - 1 x daily - 7 x weekly - 2 sets - 20-30 hold - Supine Gluteus Stretch  - 1 x daily - 7 x weekly - 2 sets - 20-30 hold - Standing Shoulder Row with Anchored Resistance  - 1 x daily - 7 x weekly - 2 sets - 10 reps - Shoulder extension with resistance - Neutral  - 1 x daily - 7 x weekly - 2 sets - 10 reps - Supine Cervical Retraction with Towel  - 1 x daily - 7 x weekly - 2 sets - 10 reps - Median Nerve Flossing - Tray  - 2 x daily - 7 x weekly - 1 sets - 5 reps - Ulnar Nerve Flossing  - 2 x daily - 7 x weekly - 1 sets - 10 reps - Wrist Flexor Stretch in Pronation  - 2 x daily - 7 x weekly - 1 sets - 3 reps - 30 sec hold  AQUATIC Access Code: [email or web address] URL: [email or web address] Date: 09/12/2024 Prepared by: [facility] This aquatic home exercise program from Med[employer] utilizes pictures from land based exercises, but has been adapted prior to lamination and issuance.     ASSESSMENT:  CLINICAL IMPRESSION:  Pt with lapse in treatment.  Pt reports 60-70% reduction in LBP and her Rt UE radiculopathy has continued with little change.  Strength testing on Rt is significantly reduced since eval.  Rt  grip strength is 3# and Lt is 24#.  Pt responds well to traction with reduced symptoms x 1 day.  PT added to HEP for strength and she continues with postural corrections and grip strength and nerve glide exercises.  PT advised that pt return to MD to discuss Rt UE symptoms and  strength deficits as this has worsened. This cert will cover today's visit.   From initial evaluation:  Patient is a 54 y.o. female who was seen today for physical therapy evaluation and treatment for neck and back pain. Kathlene presents with chronic neck and back pain there interferes with her functional mobility and ability to perform work duties. Her cervical rotation is limited and painful and affects her ability to look in her blind spots. She reports having daily headaches that sometimes last multiple days if she is having a bad flare up. She reports numbness and tingling in her upper and lower extremities that also affects her grip strength. Based on evaluation noted poor posture, muscle weakness, and decreased ROM. When testing light touch sensation patient did not feel any points on her thigh and lower leg bilaterally . She felt deep pressure sensation. Educated patient to return to her MD to get a nerve conduction study to see if she has neuropathy. She said she was going to get tested for it in the past, but never got it done. She understood the safety aspect involved of not having full sensation in her lower extremities. Patient will benefit from skilled PT to address the below impairments and improve overall function.   OBJECTIVE IMPAIRMENTS: decreased endurance, decreased mobility, difficulty walking, decreased ROM, decreased strength, hypomobility, increased fascial restrictions, increased muscle spasms, impaired flexibility, postural dysfunction, and pain.   ACTIVITY LIMITATIONS: carrying, lifting, bending, sitting, standing, squatting, sleeping, stairs, transfers, dressing, reach over head, and hygiene/grooming  PARTICIPATION LIMITATIONS: meal prep, cleaning, laundry, driving, shopping, community activity, and occupation  PERSONAL FACTORS: Age, Fitness, Time since onset of injury/illness/exacerbation, and 1-2 comorbidities: OA are also affecting patient's functional outcome.   REHAB  POTENTIAL: Good  CLINICAL DECISION MAKING: Evolving/moderate complexity  EVALUATION COMPLEXITY: Moderate   GOALS: Goals reviewed with patient? Yes  SHORT TERM GOALS: Target date: 08/13/2024  Patient will be independent with initial HEP. Baseline:  Goal status: MET 08/20/24  2.  Patient will report > or = to 30% improvement in sleep quality since starting PT. Baseline: disturbed for 3-4 hours Goal status: MET (60%) 09/02/2024  3.  Patient will demonstrate correct seated and standing posture with no verbal cues to decrease mechanical strain. Baseline:  Goal status: MET 08/20/24  4.  Patient will be able to stand for at least 10 mins with < or = to 5/10 back pain for performance of ADLs and house chores.  Baseline: 5 mins Goal status:MET 09/02/2024   LONG TERM GOALS: Target date: 11/06/2024    Patient will demonstrate independence in advanced HEP. Baseline:  Goal status: MET  2.  Patient will report > or = to 60% improvement in neck and back pain since starting PT. Baseline: no significant change in the past few weeks, slightly less frequent (11/06/24) Goal status: partially met  3.  Patient will complete 5STS in < or= to 19 sec due to improved muscle strength and to be closer to age predicted norms. Baseline: 23.40 sec Goal status: MET 09/02/2024   4.  Patient to score < or = to 29/50 on NDI due to improved function and decreased disability.  Baseline: 34/50  Goal  status: MET 09/02/2024  5.  Patient to score < or = to 25 on ODI due to decreased self perceived disability.  Baseline: 35/50 Goal status: IN PROGRESS (31/50) 10/07/2024  6.  Patient will demonstrate improved cervical ROM by 5-8 degrees to be able to look in her blind spot while driving. Baseline: see chart  Goal status: MET 09/02/2024  7.  Patient will be able to carry 7-8# unilaterally to help ability to carry in groceries.  Baseline: dropping items due to Rt grip strength (11/06/24) Goal status: not  met   8.  Patient will be able to demonstrate proper lifting and squatting technique to prevent mechanical strain and help ability to perform home chores. Baseline:  Goal status: MET  9.  Patient will verbalize 50% improvement in UE numbness/tingling for improved functional use of upper extremities. Baseline:  Goal status: not met   PLAN:  PHYSICAL THERAPY DISCHARGE SUMMARY  Visits from Start of Care: 17  Current functional level related to goals / functional outcomes: See above for current status.  Rt UE radiculopathy remains and Rt UE strength as decreased    Remaining deficits: Rt grip is 3# vs 24# on the Lt.  Pt will follow-up with MD   Education / Equipment: Body mechanics    Patient agrees to discharge. Patient goals were partially met. Patient is being discharged due to a change in medical status.  Burnard Joy, PT 11/06/2024 3:47 PM    "

## 2024-11-12 ENCOUNTER — Ambulatory Visit: Payer: Self-pay | Admitting: Pulmonary Disease

## 2024-11-12 NOTE — Telephone Encounter (Signed)
 Please schedule a visit with APP

## 2024-11-12 NOTE — Telephone Encounter (Signed)
 I called and spoke to Christy Holland. Christy Holland informed of Beth's note and verbalized understanding. Christy Holland was scheduled with Wells [place], NP tomorrow morning at 8:30 am. NFN

## 2024-11-12 NOTE — Telephone Encounter (Signed)
Beth, Please advise

## 2024-11-12 NOTE — Telephone Encounter (Signed)
 FYI Only or Action Required?: Action required by provider: medication request.  Patient is followed in Pulmonology for OSA, last seen on 10/15/2024 by Neysa Reggy BIRCH, MD.  Called Nurse Triage reporting Sinusitis.  Symptoms began about a month ago.  Interventions attempted: OTC medications: Sudafed and Prescription medications: Z-pak.  Symptoms are: unchanged.  Triage Disposition: Call PCP Within 24 Hours (overriding See Physician Within 24 Hours)  Patient/caregiver understands and will follow disposition?: Yes                                  1. LOCATION: Where does it hurt?      Nose, teeth and forehead 2. ONSET: When did the sinus pain start?  (e.g., hours, days)      Ongoing since OV on 10/15/24, improved some, but did not go away 3. SEVERITY: How bad is the pain?   (Scale 0-10; or none, mild, moderate or severe)     Rates pain 7-8 at this time 6. NASAL DISCHARGE: Do you have discharge from your nose? If so ask, What color?     Green and yellow 7. FEVER: Do you have a fever? If Yes, ask: What is it, how was it measured, and when did it start?      Denies 8. OTHER SYMPTOMS: Do you have any other symptoms? (e.g., sore throat, cough, earache, difficulty breathing)     Productive cough Denies: chest pain, difficulty breathing Reports she has been unable to use CPAP, due to sinus pressure    Patient was evaluated in office for symptoms on 10/15/24. Patient reported symptoms have improved some, but never fully went away. Patient completed prescribed Z-pak. Patient reported she was advised by her provider to call back and request an alternative medication, if symptoms persisted. Patient would also like something for the cough. Please advise.   Reason for Disposition  [1] Taking antibiotic > 72 hours (3 days) AND [2] sinus pain not improved  Protocols used: Sinus Infection on Antibiotic Follow-up Call-[facility]  Reason for Triage: head  congestion

## 2024-11-13 ENCOUNTER — Ambulatory Visit

## 2024-11-13 ENCOUNTER — Ambulatory Visit: Admitting: Physical Therapy

## 2024-11-13 VITALS — BP 92/61 | HR 82 | Temp 98.0°F | Ht 66.0 in | Wt 139.0 lb

## 2024-11-13 DIAGNOSIS — J0100 Acute maxillary sinusitis, unspecified: Secondary | ICD-10-CM

## 2024-11-13 DIAGNOSIS — J4520 Mild intermittent asthma, uncomplicated: Secondary | ICD-10-CM

## 2024-11-13 DIAGNOSIS — G4700 Insomnia, unspecified: Secondary | ICD-10-CM

## 2024-11-13 MED ORDER — TRAZODONE HCL 50 MG PO TABS: 50.0000 mg | ORAL_TABLET | Freq: Every day | ORAL | 3 refills | Status: AC

## 2024-11-13 MED ORDER — TRELEGY ELLIPTA 100-62.5-25 MCG/ACT IN AEPB: 1.0000 | INHALATION_SPRAY | Freq: Every day | RESPIRATORY_TRACT | 6 refills | Status: DC

## 2024-11-13 MED ORDER — TRELEGY ELLIPTA 100-62.5-25 MCG/ACT IN AEPB: 1.0000 | INHALATION_SPRAY | Freq: Every day | RESPIRATORY_TRACT | Status: AC

## 2024-11-13 MED ORDER — FLUTICASONE PROPIONATE 50 MCG/ACT NA SUSP: 1.0000 | Freq: Every day | NASAL | 2 refills | Status: AC

## 2024-11-13 MED ORDER — AMOXICILLIN-POT CLAVULANATE 875-125 MG PO TABS: 1.0000 | ORAL_TABLET | Freq: Two times a day (BID) | ORAL | 0 refills | Status: DC

## 2024-11-13 NOTE — Patient Instructions (Addendum)
 It was good to see you today.  I have prescribed Augmentin , an antibiotic, to be taken twice daily for 7 days.  Please take a probiotic with this, either a pill (Culturelle, Align) or Activia yogurt a couple of hours after taking medication.  Use flonase  nasal spray daily to help with recurrent sinusitis.  You can continue to use saline spray, add 1-2 sprays of Afrin for 1 week into the saline spray. Do not exceed a week.  I have sent in Trazodone , take 1/2-1 tablet 30 minutes before sleep. Try this without the Klonopin .  Follow up with Dr. Neda as scheduled in March 2026.

## 2024-11-13 NOTE — Assessment & Plan Note (Signed)
" °  Orders:   amoxicillin -clavulanate (AUGMENTIN ) 875-125 MG tablet; Take 1 tablet by mouth 2 (two) times daily.   fluticasone  (FLONASE ) 50 MCG/ACT nasal spray; Place 1 spray into both nostrils daily. Continues to have significant maxillary sinus pain, green drainage, and cough.  Discussed mucus management with flonase  and saline rinses.  May do saline rinse with 1-2 squirts of Afrin in saline rinse for 1 week. Discussed not exceeding a week.  Previously treated with Z-pack in December.  Augmentin  X 7 days ordered.  Discussed probiotic with antibiotic, like Culturelle or Align, or Activia yogurt 2 hours after taking antibiotic.  "

## 2024-11-13 NOTE — Progress Notes (Addendum)
 "   Subjective:  Patient ID: Christy Holland, female    DOB: 1970/11/13  MRN: [id number]  Referred by: Leonce Carola PARAS, PA-C  CC:  Chief Complaint  Patient presents with   Acute Visit    Sinusitis    HPI Christy Holland is a 54 y.o. female never smoker followed for insomnia, minimal OSA, dyspnea on exertion, and chronic maxillary sinusitis here today for follow up on sinusitis, asthma, and insomnia.   She was last seen by Dr. Neysa 10/15/2024 where he prescribed a Z-pack for sinus infection and reordered CPAP machine. She completed her antibiotics a couple of weeks ago but still reports sinus pressure and pain, green mucus, a dry cough, postnasal drip, fatigue, and eye discharge. She has been doing saline rinses for symptom relief. She denies any fever or chest pain.   She has mild intermittent asthma and previously tried Trelegy for maintenance. She felt like she had good symptom control on Trelegy but was concerned about cost. She has tried to control asthma symptoms with rescue inhaler alone. She feels like her breathing and cough have worsened and would like to move forward with Trelegy. She understands that Trelegy will be a daily maintenance inhaler and to use albuterol  as needed for breakthrough shortness of breath. Continue reducing mold and allergy exposures as discussed previously.   She has mild OSA and is in the process of getting a new machine. She has not been able to wear CPAP nightly due to recent maxillary sinusitis. She is also interested in trying a different medication for sleep management. She is currently taking Klonopin , she has discussed trazodone  and lunesta with Dr. Neysa in the past. After discussing both medications, she would like to trial the trazodone  for sleep management.    Review of Systems  Constitutional:  Positive for malaise/fatigue. Negative for chills and fever.  HENT:  Positive for congestion and sinus pain. Negative for ear pain and sore throat.    Eyes:  Positive for discharge. Negative for blurred vision, pain and redness.  Respiratory:  Positive for cough, sputum production (green) and shortness of breath. Negative for wheezing. Hemoptysis: nonproductive.  Cardiovascular:  Negative for chest pain, palpitations and leg swelling.  Gastrointestinal:  Negative for constipation, diarrhea, nausea and vomiting.  Genitourinary:  Negative for dysuria, frequency and urgency.  Musculoskeletal:  Negative for back pain, falls and myalgias.  Skin:  Negative for itching and rash.  Neurological:  Positive for headaches. Negative for dizziness and weakness.  Endo/Heme/Allergies:  Positive for environmental allergies. Negative for polydipsia. Does not bruise/bleed easily.  Psychiatric/Behavioral: Negative.      Allergies: Sulfamethoxazole-trimethoprim, Lactose, Sulfa antibiotics, and Sulfasalazine Current Medications[1] 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/2017   Rhinitis, chronic 08/21/2017   Sinusitis 02/13/2018   Snoring 08/21/2017   Past Surgical History:  Procedure Laterality Date   PVC ABLATION N/A 06/18/2021   Procedure: PVC ABLATION;  Surgeon: Waddell Danelle ORN, MD;  Location: [facility];  Service: Cardiovascular;  Laterality: N/A;   Family History  Problem Relation Age of Onset   Hypertension Mother    Hyperlipidemia Mother    Diabetes Mother    Hypertension Father    Hyperlipidemia Father    Diabetes Father    Heart attack Father 22   Stroke Father    Social History   Socioeconomic History  Marital status: Married    Spouse name: Not on file   Number of children: 3   Years of education: Not on file   Highest education level: Not on file  Occupational History   Not on file  Tobacco Use   Smoking status: Never    Passive exposure: Never   Smokeless tobacco: Never  Vaping  Use   Vaping status: Never Used  Substance and Sexual Activity   Alcohol use: No    Alcohol/week: 0.0 standard drinks of alcohol   Drug use: No   Sexual activity: Yes    Birth control/protection: None  Other Topics Concern   Not on file  Social History Narrative   Right Handed   No Caffeine Use    Social Drivers of Health   Tobacco Use: Low Risk (11/13/2024)   Patient History    Smoking Tobacco Use: Never    Smokeless Tobacco Use: Never    Passive Exposure: Never  Financial Resource Strain: Low Risk (11/18/2022)   Received from [facility]   Overall Financial Resource Strain (CARDIA)    Difficulty of Paying Living Expenses: Not hard at all  Food Insecurity: Low Risk (03/07/2023)   Received from [facility]   Epic    Within the past 12 months, you worried that your food would run out before you got money to buy more: Never true    Within the past 12 months, the food you bought just didn't last and you didn't have money to get more. : Never true  Transportation Needs: No Transportation Needs (03/07/2023)   Received from [facility]    In the past 12 months, has lack of reliable transportation kept you from medical appointments, meetings, work or from getting things needed for daily living? : No  Physical Activity: Not on file  Stress: Not on file  Social Connections: Not on file  Intimate Partner Violence: Not At Risk (12/06/2023)   Received from [facility]   HITS    Over the last 12 months how often did your partner physically hurt you?: Never    Over the last 12 months how often did your partner insult you or talk down to you?: Never    Over the last 12 months how often did your partner threaten you with physical harm?: Never    Over the last 12 months how often did your partner scream or curse at you?: Never  Depression (PHQ2-9): Not on file  Alcohol Screen: Not on file  Housing: Low Risk (03/07/2023)   Received from [facility]   Epic    What  is your living situation today?: I have a steady place to live    Think about the place you live. Do you have problems with any of the following? Choose all that apply:: None/None on this list  Utilities: Low Risk (03/07/2023)   Received from [facility]   Utilities    In the past 12 months has the electric, gas, oil, or water company threatened to shut off services in your home? : No  Health Literacy: Not on file       Objective:  BP 92/61   Pulse 82   Temp 98 F (36.7 C) (Oral)   Ht 5' 6 (1.676 m)   Wt 139 lb (63 kg)   SpO2 100%   BMI 22.44 kg/m    Physical Exam Constitutional:      Appearance: Normal appearance.  HENT:     Head:  Normocephalic and atraumatic.     Mouth/Throat:     Mouth: Mucous membranes are moist.     Pharynx: Posterior oropharyngeal erythema present.  Cardiovascular:     Rate and Rhythm: Normal rate and regular rhythm.     Pulses: Normal pulses.     Heart sounds: Normal heart sounds.  Pulmonary:     Effort: Pulmonary effort is normal.     Breath sounds: Normal breath sounds.  Musculoskeletal:        General: Normal range of motion.     Cervical back: Normal range of motion and neck supple.  Skin:    General: Skin is warm and dry.     Capillary Refill: Capillary refill takes less than 2 seconds.  Neurological:     General: No focal deficit present.     Mental Status: She is alert and oriented to person, place, and time.  Psychiatric:        Mood and Affect: Mood normal.        Behavior: Behavior normal.    Diagnostic Review:  Metabolic Panel : Last metabolic panel Lab Results  Component Value Date   GLUCOSE 101 (H) 06/10/2021   NA 140 06/10/2021   K 4.2 06/10/2021   CL 102 06/10/2021   CO2 29 06/10/2021   BUN 7 06/10/2021   CREATININE 0.77 06/10/2021   EGFR 95 06/10/2021   CALCIUM 9.4 06/10/2021   PROT 6.9 07/31/2017   ALBUMIN 4.2 07/31/2017   LABGLOB 2.7 07/31/2017   AGRATIO 1.6 07/31/2017   BILITOT <0.2 07/31/2017   ALKPHOS  63 07/31/2017   AST 26 07/31/2017   ALT 11 07/31/2017   05/08/2024: PFTs  FVC-Pre 3.05  FVC-%Pred-Pre 81  FVC-Post 3.21  FVC-%Pred-Post 85  FVC-%Change-Post 5  FEV1-Pre 2.77  FEV1-%Pred-Pre 93  FEV1-Post 2.67  FEV1-%Pred-Post 90  FEV1-%Change-Post -3  FEV6-Pre 3.05  FEV6-%Pred-Pre 83  FEV6-Post 3.21  FEV6-%Pred-Post 88  FEV6-%Change-Post 5  Pre FEV1/FVC ratio 91  FEV1FVC-%Pred-Pre 113  Post FEV1/FVC ratio 83  FEV1FVC-%Change-Post -8  Pre FEV6/FVC Ratio 100  FEV6FVC-%Pred-Pre 102  Post FEV6/FVC ratio 100  FEV6FVC-%Pred-Post 102  FEF 25-75 Pre 3.15  FEF2575-%Pred-Pre 112  FEF 25-75 Post 2.92  FEF2575-%Pred-Post 104  FEF2575-%Change-Post -7  RV 2.67  RV % pred 138  TLC 5.28  TLC % pred 98  DLCO unc 21.22  DLCO unc % pred 95  DLCO cor 21.49  DLCO cor % pred 96  DL/VA 4.70  DL/VA % pred 874      Assessment & Plan:   Assessment & Plan Mild intermittent asthma without complication  Orders:   Fluticasone -Umeclidin-Vilant (TRELEGY ELLIPTA ) 100-62.5-25 MCG/ACT AEPB; Inhale 1 each into the lungs daily. Discussed using over the counter plain Delsum for night time coughing until flair is under control and sinusitis improved.  Previously tried Trelegy and had good symptom control.  She is using albuterol  as needed but feels that she is short of breath regardless.  Provided her with samples and sent in prescription.  Acute maxillary sinusitis, recurrence not specified  Orders:   amoxicillin -clavulanate (AUGMENTIN ) 875-125 MG tablet; Take 1 tablet by mouth 2 (two) times daily.   fluticasone  (FLONASE ) 50 MCG/ACT nasal spray; Place 1 spray into both nostrils daily. Continues to have significant maxillary sinus pain, green drainage, and cough.  Discussed mucus management with flonase  and saline rinses.  May do saline rinse with 1-2 squirts of Afrin in saline rinse for 1 week. Discussed not exceeding a week.  Previously treated with  Z-pack in December.  Augmentin  X 7 days  ordered.  Discussed probiotic with antibiotic, like Culturelle or Align, or Activia yogurt 2 hours after taking antibiotic.  Insomnia, unspecified type  Orders:   traZODone  (DESYREL ) 50 MG tablet; Take 1 tablet (50 mg total) by mouth at bedtime. Previously managed with Klonopin .  Has OSA, new machine has been ordered.  She hasn't been able to use her CPAP appropriately due to sinusitis. Hopefully with fully treating sinusitis she can restart CPAP use.  Would like to trial trazodone , discussed not taking Klonopin  with trazodone .  She can take 1/2-1 tablet nightly. Instructed her to reach out to the office if she doesn't feel it is effective at current dose.  Follow up with Dr. Neda in March 2026 as scheduled.    I spent 32 minutes dedicated to the care of this patient on the date of this encounter to include pre-visit review of records, face-to-face time with the patient discussing conditions above, post visit ordering of testing, clinical documentation with the electronic health record, making appropriate referrals as documented, and communicating necessary information to the patient's healthcare team.    No follow-ups on file.   Wells CHRISTELLA Georgia, FNP     [1]  Current Outpatient Medications:    albuterol  (VENTOLIN  HFA) 108 (90 Base) MCG/ACT inhaler, Inhale 2 puffs into the lungs every 6 (six) hours as needed for wheezing or shortness of breath., Disp: 8 g, Rfl: 6   amoxicillin -clavulanate (AUGMENTIN ) 875-125 MG tablet, Take 1 tablet by mouth 2 (two) times daily., Disp: 14 tablet, Rfl: 0   cetirizine (ZYRTEC) 10 MG tablet, Take 1 tablet by mouth daily., Disp: , Rfl:    clonazePAM  (KLONOPIN ) 0.5 MG tablet, TAKE 1 TO 4 TABLETS BY MOUTH FOR SLEEP AS NEEDED, Disp: 90 tablet, Rfl: 5   clonazePAM  (KLONOPIN ) 1 MG tablet, Take 1 tablet (1 mg total) by mouth at bedtime., Disp: 30 tablet, Rfl: 5   fluticasone  (FLONASE ) 50 MCG/ACT nasal spray, Place 1 spray into both nostrils daily., Disp: 16 g,  Rfl: 2   Fluticasone -Umeclidin-Vilant (TRELEGY ELLIPTA ) 100-62.5-25 MCG/ACT AEPB, Inhale 1 each into the lungs daily., Disp: 1 each, Rfl: 6   metFORMIN (GLUCOPHAGE-XR) 500 MG 24 hr tablet, Take 500 mg by mouth 2 (two) times daily., Disp: , Rfl:    metoprolol  tartrate (LOPRESSOR ) 25 MG tablet, Take 1 tablet (25 mg total) by mouth 2 (two) times daily., Disp: 180 tablet, Rfl: 3   Multiple Vitamin (MULTIVITAMIN) capsule, Take 1 capsule by mouth daily., Disp: , Rfl:    omeprazole (PRILOSEC) 40 MG capsule, TAKE 1 CAPSULE BY MOUTH BEFORE BREAKFAST AND BEFORE EVENING MEAL. TAKE 30 MINUTES BEFORE MEALS., Disp: , Rfl:    pantoprazole (PROTONIX) 40 MG tablet, Take 40 mg by mouth., Disp: , Rfl:    SUMAtriptan (IMITREX) 100 MG tablet, Take 100 mg by mouth., Disp: , Rfl:    tirzepatide (MOUNJARO) 12.5 MG/0.5ML Pen, Inject 12.5 mg into the skin., Disp: , Rfl:    traZODone  (DESYREL ) 50 MG tablet, Take 1 tablet (50 mg total) by mouth at bedtime., Disp: 90 tablet, Rfl: 3   tretinoin  (RETIN-A ) 0.05 % cream, Apply topically at bedtime. For the 1st month apply 2 night weekly (m and [place]), after 1st month increase usage to 3 nights weekly (M-W-F), Disp: 45 g, Rfl: 2   Vitamin D, Ergocalciferol, (DRISDOL) 1.25 MG (50000 UT) CAPS capsule, Take 50,000 Units by mouth every 7 (seven) days., Disp: , Rfl:   "

## 2024-11-13 NOTE — Assessment & Plan Note (Signed)
" °  Orders:   traZODone  (DESYREL ) 50 MG tablet; Take 1 tablet (50 mg total) by mouth at bedtime. Previously managed with Klonopin .  Has OSA, new machine has been ordered.  She hasn't been able to use her CPAP appropriately due to sinusitis. Hopefully with fully treating sinusitis she can restart CPAP use.  Would like to trial trazodone , discussed not taking Klonopin  with trazodone .  She can take 1/2-1 tablet nightly. Instructed her to reach out to the office if she doesn't feel it is effective at current dose.  Follow up with Dr. Neda in March 2026 as scheduled.  "

## 2024-11-15 ENCOUNTER — Ambulatory Visit: Payer: Self-pay

## 2024-11-15 ENCOUNTER — Other Ambulatory Visit: Payer: Self-pay

## 2024-11-15 DIAGNOSIS — R051 Acute cough: Secondary | ICD-10-CM

## 2024-11-15 MED ORDER — HYDROCODONE BIT-HOMATROP MBR 5-1.5 MG/5ML PO SOLN: 5.0000 mL | Freq: Every evening | ORAL | 0 refills | Status: AC | PRN

## 2024-11-15 NOTE — Telephone Encounter (Signed)
 Please advise.

## 2024-11-15 NOTE — Telephone Encounter (Signed)
 Called patient at mobile number and confirmed identity with two identifiers. I saw Christy Holland 11/13/24 for maxillary sinusitis that didn't improve with azithromycin . I prescribed Augmentin  at this visit and advised her on mucus control strategies like Flonase  and saline rinses as well as using OTC Delsym. Despite using Delsym, she reports continued productive cough with green sputum that keeps her awake at night. Otherwise, she states she is feeling better. Ordered Hycodan cough syrup to be taken at night as need for cough. She can still take Delsym during the day time for cough control and should continue to use flonase  and saline rinses. Continue antibiotics as prescribed.  Instructed patient to let us  know if she doesn't continue to improve. She verbalized understanding of this plan.   Wells CHRISTELLA Georgia, FNP

## 2024-11-15 NOTE — Telephone Encounter (Signed)
 FYI Only or Action Required?: Action required by provider: update on patient condition and medication request.  Patient was last seen in primary care on insomnia.  Called Nurse Triage reporting Cough and Nasal Congestion.  Symptoms began a week ago.  Interventions attempted: OTC medications: delsym, nasal rinses, flonase  and Prescription medications: amoxicillin .  Symptoms are: unchanged.  Triage Disposition: Home Care  Patient/caregiver understands and will follow disposition?: Yes  Copied from CRM #[id number]. Topic: Clinical - Medical Advice >> Nov 15, 2024 10:45 AM Leila BROCKS wrote: Reason for CRM: Patient [phone] states was seen 11/13/24 and the cough is not better with Delsym at night, keeping her awake at night and would like another medication.  Please advise and call back.   [id number] - [place], [place] - [address] AT [facility] OF [address] [place] [address] Phone: [phone] Fax: [phone] Reason for Disposition  Care advice for mild cough, questions about  Answer Assessment - Initial Assessment Questions Patient requesting prescription cough medicine. Delsym not controlling cough   1. ONSET: When did the nasal discharge start?      Symptoms one week ago initially 2. AMOUNT: How much discharge is there?      Increased nasal drainage 3. COUGH: Do you have a cough? If Yes, ask: Describe the color of your mucus. (e.g., clear, white, yellow, green)     Cough not improved with delsym, especially bothersome at night 4. RESPIRATORY DISTRESS: Describe your breathing.      denies 5. FEVER: Do you have a fever? If Yes, ask: What is your temperature, how was it measured, and when did it start?     denies  7. OTHER SYMPTOMS: Do you have any other symptoms? (e.g., earache, mouth sores, sore throat, wheezing)     denies  Protocols used: Common Cold-A-AH

## 2024-11-19 ENCOUNTER — Ambulatory Visit (INDEPENDENT_AMBULATORY_CARE_PROVIDER_SITE_OTHER)

## 2024-11-19 ENCOUNTER — Ambulatory Visit (INDEPENDENT_AMBULATORY_CARE_PROVIDER_SITE_OTHER): Admitting: Podiatry

## 2024-11-19 ENCOUNTER — Encounter: Payer: Self-pay | Admitting: Podiatry

## 2024-11-19 DIAGNOSIS — M76821 Posterior tibial tendinitis, right leg: Secondary | ICD-10-CM | POA: Diagnosis not present

## 2024-11-19 DIAGNOSIS — M76822 Posterior tibial tendinitis, left leg: Secondary | ICD-10-CM

## 2024-11-19 DIAGNOSIS — G5753 Tarsal tunnel syndrome, bilateral lower limbs: Secondary | ICD-10-CM | POA: Diagnosis not present

## 2024-11-19 DIAGNOSIS — M21612 Bunion of left foot: Secondary | ICD-10-CM | POA: Diagnosis not present

## 2024-11-19 DIAGNOSIS — M722 Plantar fascial fibromatosis: Secondary | ICD-10-CM

## 2024-11-19 MED ORDER — NAPROXEN 500 MG PO TABS: 500.0000 mg | ORAL_TABLET | Freq: Two times a day (BID) | ORAL | 0 refills | Status: AC

## 2024-11-19 NOTE — Progress Notes (Signed)
 Subjective:   Patient ID: Christy Holland, female   DOB: 54 y.o.   MRN: [id number]   HPI Chief Complaint  Patient presents with   Foot Pain    Follow up posterior tibial tendonitis/PF bilateral - wanted a check up regarding previous diagnosis, she would like to talk about her old orthotics-thinking she may need new ones, also would like to get a new handicap placard, she does still have some pain in both feet with the tendonitis and fasciitis, but also 1st MPJ left as well as knee pain   New Patient (Initial Visit)    Est pt 2889   54 year old female presents to the office today with the above concerns.  She states she gets pain to her feet which limits her standing and walking.  She states is assess ROS her symptoms are about the same and she has good days and bad days.  She points on the area of the left bunion which does flare but also discomfort.  She also reports some numbness and tingling to her feet.  This is intermittent.  She also brings up some knee issues that she has been having and she is wondering if injection would help with that as well.  Asking for updated handicap placard paperwork.   Review of Systems  All other systems reviewed and are negative.  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/2017   Rhinitis, chronic 08/21/2017   Sinusitis 02/13/2018   Snoring 08/21/2017    Past Surgical History:  Procedure Laterality Date   PVC ABLATION N/A 06/18/2021   Procedure: PVC ABLATION;  Surgeon: Waddell Danelle ORN, MD;  Location: [facility];  Service: Cardiovascular;  Laterality: N/A;    Current Medications[1]  Allergies[2]        Objective:  Physical Exam  General: AAO x3, NAD  Dermatological: Skin is warm, dry and supple bilateral. There are no open sores, no preulcerative lesions, no rash or signs of infection  present.  Vascular: Dorsalis Pedis artery and Posterior Tibial artery pedal pulses are 2/4 bilateral with immedate capillary fill time. There is no pain with calf compression, swelling, warmth, erythema.   Neruologic: Grossly intact via light touch bilateral.  Positive Tinel sign bilaterally  Musculoskeletal: Decreased medial arch height bilaterally.  Mild bunion is noted on the left foot with tenderness palpation on the bunion itself as well as mild pain with range of motion of the first MTPJ.  She does get discomfort along the plantar aspect of calcaneus on insertion of plantar fascia bilaterally with the right side seems to be worse than the left.  Mild discomfort on the distal portion of posterior tibial tendon.  There is no edema, erythema.  Ankle, subtalar range of motion intact.  MMT 5/5.        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  Multiple views of bilateral feet were obtained there is no evidence of acute fracture.  Mild bunion is present on the left side. -We discussed with conservative as well as surgical treatment options.  Would continue with conservative management.  I prescribed naproxen  to take  short-term as needed.  Watch for any side effects and discussed discontinuing it should any side effects occur.  Discussed icing on regular basis.  Discussed home stretching, rehab exercises well.  I do think she will benefit from new orthotics and these were ordered today.  Also referral to physical therapy to help with soft tissue strengthening of the posterior tibial tendinitis, plantar fasciitis. -Some of her burning symptoms I do think the result of tarsal tunnel.  Symptoms persist consider  repeat MRIs. -But the bunion dispensed toe spacer.  Continue shoe modifications, good arch support.  Return in about 2 months (around 01/17/2025).  Christy Holland DPM         [1]  Current Outpatient Medications:    naproxen  (NAPROSYN ) 500 MG tablet, Take 1 tablet (500 mg total) by mouth 2 (two) times daily with a meal., Disp: 30 tablet, Rfl: 0   nystatin ointment (MYCOSTATIN), Apply topically 2 (two) times daily., Disp: , Rfl:    topiramate (TOPAMAX) 50 MG tablet, Take 100 mg by mouth., Disp: , Rfl:    albuterol  (VENTOLIN  HFA) 108 (90 Base) MCG/ACT inhaler, Inhale 2 puffs into the lungs every 6 (six) hours as needed for wheezing or shortness of breath., Disp: 8 g, Rfl: 6   cetirizine (ZYRTEC) 10 MG tablet, Take 1 tablet by mouth daily., Disp: , Rfl:    clonazePAM  (KLONOPIN ) 1 MG tablet, Take 1 tablet (1 mg total) by mouth at bedtime., Disp: 30 tablet, Rfl: 5   fluticasone  (FLONASE ) 50 MCG/ACT nasal spray, Place 1 spray into both nostrils daily., Disp: 16 g, Rfl: 2   Fluticasone -Umeclidin-Vilant (TRELEGY ELLIPTA ) 100-62.5-25 MCG/ACT AEPB, Inhale 1 puff into the lungs daily at 2 PM., Disp: , Rfl:    HYDROcodone  bit-homatropine (HYCODAN) 5-1.5 MG/5ML syrup, Take 5 mLs by mouth at bedtime as needed for cough., Disp: 120 mL, Rfl: 0   metFORMIN (GLUCOPHAGE-XR) 500 MG 24 hr tablet, Take 500 mg by mouth 2 (two) times daily., Disp: , Rfl:    metoprolol  tartrate (LOPRESSOR ) 25 MG tablet, Take 1 tablet (25 mg total) by mouth 2 (two) times daily., Disp: 180 tablet, Rfl: 3   Multiple Vitamin (MULTIVITAMIN) capsule, Take 1 capsule by mouth daily., Disp: , Rfl:    omeprazole (PRILOSEC) 40 MG capsule, TAKE 1 CAPSULE BY MOUTH BEFORE BREAKFAST AND BEFORE EVENING MEAL. TAKE 30 MINUTES BEFORE MEALS., Disp: , Rfl:    pantoprazole (PROTONIX) 40 MG tablet, Take 40 mg by mouth., Disp: , Rfl:    SUMAtriptan (IMITREX) 100 MG tablet, Take 100 mg by mouth., Disp: , Rfl:    tirzepatide (MOUNJARO) 12.5 MG/0.5ML Pen,  Inject 12.5 mg into the skin., Disp: , Rfl:    traZODone  (DESYREL ) 50 MG tablet, Take 1 tablet (50 mg total) by mouth at bedtime., Disp: 90 tablet, Rfl: 3   tretinoin  (RETIN-A ) 0.05 % cream, Apply topically at bedtime. For the 1st month apply 2 night weekly (m and [place]), after 1st month increase usage to 3 nights weekly (M-W-F), Disp: 45 g, Rfl: 2   Vitamin D, Ergocalciferol, (DRISDOL) 1.25 MG (50000 UT) CAPS capsule, Take 50,000 Units by mouth every 7 (seven) days., Disp: , Rfl:  [2]  Allergies Allergen Reactions   Sulfamethoxazole-Trimethoprim Rash   Lactose Nausea And Vomiting and Other (See Comments)   Sulfa Antibiotics    Sulfasalazine Hives    Patient tolerates NSAIDS

## 2024-11-20 ENCOUNTER — Ambulatory Visit: Admitting: Physical Therapy

## 2024-11-28 NOTE — Therapy (Unsigned)
 " OUTPATIENT PHYSICAL THERAPY FEMALE PELVIC EVALUATION   Patient Name: Christy Holland MRN: [id number] DOB:1971-04-19, 54 y.o., female Today's Date: 11/29/2024  END OF SESSION:  PT End of Session - 11/29/24 0943     Visit Number 1    Date for Recertification  05/29/25    Authorization Type Sherleen /CARELON    Authorization Time Period cigna 13/30;    PT Start Time 0930    PT Stop Time 1010    PT Time Calculation (min) 40 min    Activity Tolerance Patient tolerated treatment well    Behavior During Therapy [facility] for tasks assessed/performed          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/2017   Rhinitis, chronic 08/21/2017   Sinusitis 02/13/2018   Snoring 08/21/2017   Past Surgical History:  Procedure Laterality Date   PVC ABLATION N/A 06/18/2021   Procedure: PVC ABLATION;  Surgeon: Waddell Danelle ORN, MD;  Location: [facility];  Service: Cardiovascular;  Laterality: N/A;   Patient Active Problem List   Diagnosis Date Noted   Menorrhagia 05/01/2024   Osteoarthritis of right knee 01/26/2024   Bilateral carpal tunnel syndrome 12/27/2023   Arthralgia of right knee 11/22/2023   Low back pain 11/14/2023   Neck pain 11/14/2023   Episodic recurrent vertigo 07/31/2023   Balance disorder 07/17/2023   Dizziness 07/17/2023   Carpal tunnel syndrome on both sides 04/04/2022   Chronic right shoulder pain 04/04/2022   Cervical radiculopathy 04/04/2022   Class 2 severe obesity due to excess calories with serious comorbidity and body mass index (BMI) of 36.0 to 36.9 in adult 03/17/2022   History of cardiac radiofrequency ablation 01/18/2022   Insomnia 11/12/2021   Mass of left breast 10/19/2021   Acne vulgaris 09/28/2021   Dyspnea on exertion 04/03/2021   Holter monitor, abnormal 08/22/2019   Frequent PVCs 03/01/2019   Sinusitis 02/13/2018    Chronic maxillary sinusitis 08/21/2017   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/21/2017   Rhinitis, chronic 08/21/2017   OSA (obstructive sleep apnea) 08/21/2017    PCP: Leonce Carola PARAS, PA-C  REFERRING PROVIDER: Kandyce Sor, MD   REFERRING DIAG: N94.10 (ICD-10-CM) - Unspecified dyspareunia   THERAPY DIAG:  Cramp and spasm - Plan: PT plan of care cert/re-cert  Pelvic pain - Plan: PT plan of care cert/re-cert  Urinary incontinence, unspecified type - Plan: PT plan of care cert/re-cert  Incontinence of feces, unspecified fecal incontinence type - Plan: PT plan of care cert/re-cert  Rationale for Evaluation and Treatment: Rehabilitation  ONSET DATE: 10/2013  SUBJECTIVE:  SUBJECTIVE STATEMENT: Bladder issues Fluid intake: water=, zero Gatorade   PERTINENT HISTORY:  Medications for current condition: mybetric Surgeries: PVC Ablation Other: see above Sexual abuse: No  PAIN:  Are you having pain? Yes NPRS scale: 6/10 Pain location: lower abdominal area  Pain type: sharp and heavy feeling Pain description: intermittent, constant, and heavy is constant   Aggravating factors: random Relieving factors: not sure what to help with pain  PRECAUTIONS: None  RED FLAGS: None   WEIGHT BEARING RESTRICTIONS: No  FALLS:  Has patient fallen in last 6 months? No  OCCUPATION: bus driver  ACTIVITY LEVEL : walking stretches  PLOF: Independent  PATIENT GOALS: increase pelvic floor strength, bladder control   BOWEL MOVEMENT:  Pain with bowel movement: No Type of bowel movement:Type (Bristol Stool Scale) type 2, 3 and Frequency 2-3 dayspush on rectum to have stool come out Fully empty rectum: No Leakage: Yes: wipe multiple times                                                  Bowel  urgency: no Fiber supplement/laxative Miralax  URINATION: Pain with urination: Yes; pain level 4/10 Fully empty bladder: Noalways feels bladder is full when sit awhile will urinate more                                         Post-void dribble: Yes  Stream: urinate and more is coming out, slow stream Urgency: Yes , always needs to know where the bathroom is Frequency:during the day every hour                                                        Nocturia: Yes: 2-3 times   Leakage: Urge to void, Walking to the bathroom, Coughing, Sneezing, Laughing, Lifting, and Bending forward Pads/briefs: Yes: 2-3 times  INTERCOURSE:  Ability to have vaginal penetration Yes  Pain with intercourse: on occasion with deep penetration Dryness: Yes  Climax: not always Marinoff Scale: 1/3 Lubricant:yes  PREGNANCY: Vaginal deliveries 2 Tearing Yes: lots of stitches  C-section deliveries 1   PROLAPSE: Pressure   OBJECTIVE:  Note: Objective measures were completed at Evaluation unless otherwise noted.   PATIENT SURVEYS:  PFIQ-7: 67 UIQ-7 67   COGNITION: Overall cognitive status: Within functional limits for tasks assessed     SENSATION: Light touch: Appears intact    POSTURE: No Significant postural limitations    LOWER EXTREMITY MMT:  MMT Right eval Left eval  Hip flexion 4/5 4/5  Hip abduction 4/5 4/5  Hip adduction 4/5 4/5   (Blank rows = not tested) PALPATION:  Abdominal: tenderness throughout the abdomen  Diastasis: No Distortion: No  Breathing: decreased lower rib cage mobility Scar tissue: No                External Perineal Exam: paleness along the vulvar area                             Internal Pelvic Floor: tenderness located on the levator  ani, obturator internist, urethra and sides of the bladder  Patient confirms identification and approves PT to assess internal pelvic floor and treatment Yes All internal or external pelvic floor assessments and/or  treatments are completed with proper hand hygiene and gloves hands. If needed gloves are changed with hand hygiene during patient care time. No emotional/communication barriers or cognitive limitation. Patient is motivated to learn. Patient understands and agrees with treatment goals and plan. PT explains patient will be examined in standing, sitting, and lying down to see how their muscles and joints work. When they are ready, they will be asked to remove their underwear so PT can examine their perineum. The patient is also given the option of providing their own chaperone as one is not provided in our facility. The patient also has the right and is explained the right to defer or refuse any part of the evaluation or treatment including the internal exam. With the patient's consent, PT will use one gloved finger to gently assess the muscles of the pelvic floor, seeing how well it contracts and relaxes and if there is muscle symmetry. After, the patient will get dressed and PT and patient will discuss exam findings and plan of care. PT and patient discuss plan of care, schedule, attendance policy and HEP activities.   PELVIC MMT:   MMT eval  Vaginal 1/5  Internal Anal Sphincter 0/5  External Anal Sphincter 0/5  Puborectalis 0/5  (Blank rows = not tested)        TONE: Increased   PROLAPSE: none  TODAY'S TREATMENT:                                                                                                                              DATE: 11/29/24  EVAL Examination completed, findings reviewed, pt educated on POC, HEP, and female pelvic floor anatomy, reasoning with pelvic floor assessment internally with pt consent, and abdominal massage. Pt motivated to participate in PT and agreeable to attempt recommendations.     PATIENT EDUCATION:  Education details: see above Person educated: Patient Education method: Explanation, Demonstration, Tactile cues, Verbal cues, and Handouts Education  comprehension: verbalized understanding, returned demonstration, verbal cues required, tactile cues required, and needs further education  HOME EXERCISE PROGRAM: Exercises will be given at next visit  ASSESSMENT:  CLINICAL IMPRESSION: Patient is a 54 y.o. female who was seen today for physical therapy evaluation and treatment for dyspareunia and urinary and fecal leakage. Patient reports she is having pelvic pain with intercourse at level 6/10 and has a heaviness in the pelvic area. She will leak stool when she does not want to and have to wipe multiple times. She will have to push on the rectum to fully empty her rectum. She does not always feel the bladder is full. She will urinate then have to return. She has a slow stream. She leaks urine with urge to void, walking to the bathroom, coughing, sneezing, laughing, lifting, and  bending forward. She wears 2-3 pads per day. She has urinary urgency and always needs to know where the bathroom is. Pelvic strength is 1/5 and rectal strength is 0/5. She has increased tone of the pelvic floor and not able to relax. Patient will benefit from skilled therapy to reduce urinary and fecal leakage while reducing pain.   OBJECTIVE IMPAIRMENTS: decreased activity tolerance, decreased coordination, decreased strength, increased fascial restrictions, increased muscle spasms, and pain.   ACTIVITY LIMITATIONS: continence and locomotion level  PARTICIPATION LIMITATIONS: community activity and occupation  PERSONAL FACTORS: Time since onset of injury/illness/exacerbation are also affecting patient's functional outcome.   REHAB POTENTIAL: Excellent  CLINICAL DECISION MAKING: Evolving/moderate complexity  EVALUATION COMPLEXITY: Moderate   GOALS: Goals reviewed with patient? Yes  SHORT TERM GOALS: Target date: 12/27/24  Patient educated on how to perform diaphragmatic breathing to relax the pelvic floor.  Baseline:not educated yet Goal status: INITIAL  2.   Patient educated on self perineal massage to elongate the pelvic floor muscles.  Baseline: not educated yet.  Goal status: INITIAL  3.  Patient educated on hip and pelvic floor stretches to elongate the muscles.  Baseline: not educated yet.  Goal status: INITIAL  4.  Patient educated on the urge to void technique to reduce urgency.  Baseline: not educated yet.  Goal status: INITIAL    LONG TERM GOALS: Target date: 05/29/25  Patient independent with advanced HEP for relax and strengthen the pelvic floor to reduce leakage.  Baseline: not educated yet.  Goal status: INITIAL  2.  Rectal strength >/= 3/5 so she is wiping after a bowel movement 1-2 times and is able to fully empty the rectum.  Baseline: strength is 0/5 Goal status: INITIAL  3.  Patient is able to fully empty her bladder with a strong urine stream so she is not returning to the commode afterwards.  Baseline: slow stream and returns to commode several minutes after.  Goal status: INITIAL  4.  Patient has improve coordination to reduce pad usage to </= 1 due to decreased leakage.  Baseline: uses 2-3 pads Goal status: INITIAL  5.  Patient is able to have the urge to void and walk to the bathroom without leaking urine.  Baseline: leaks urine when she has the urge.  Goal status: INITIAL   PLAN:  PT FREQUENCY: 1-2x/week  PT DURATION: 6 months  PLANNED INTERVENTIONS: 97110-Therapeutic exercises, 97530- Therapeutic activity, 97112- Neuromuscular re-education, 97535- Self Care, [id number]- Manual therapy, [id number]- Electrical stimulation (unattended), 20560 (1-2 muscles), 20561 (3+ muscles)- Dry Needling, Patient/Family education, Joint mobilization, Spinal mobilization, Cryotherapy, Moist heat, and Biofeedback  PLAN FOR NEXT SESSION: diaphragmatic breathing, sitting on ball to massage pelvic floor, hip stretches, SI stertches   Channing Pereyra, PT 11/29/24 12:13 PM  [facility] [address],  [address] [place], [place] [address] Phone # [phone] Fax [phone]   "

## 2024-11-29 ENCOUNTER — Ambulatory Visit: Attending: Family Medicine | Admitting: Physical Therapy

## 2024-11-29 ENCOUNTER — Other Ambulatory Visit: Payer: Self-pay

## 2024-11-29 ENCOUNTER — Encounter: Payer: Self-pay | Admitting: Physical Therapy

## 2024-11-29 DIAGNOSIS — R32 Unspecified urinary incontinence: Secondary | ICD-10-CM

## 2024-11-29 DIAGNOSIS — R102 Pelvic and perineal pain unspecified side: Secondary | ICD-10-CM

## 2024-11-29 DIAGNOSIS — R159 Full incontinence of feces: Secondary | ICD-10-CM

## 2024-11-29 DIAGNOSIS — R252 Cramp and spasm: Secondary | ICD-10-CM

## 2024-12-12 ENCOUNTER — Encounter: Payer: Self-pay | Admitting: Psychology

## 2025-01-01 ENCOUNTER — Ambulatory Visit: Admitting: Physical Therapy

## 2025-01-08 ENCOUNTER — Ambulatory Visit: Admitting: Physical Therapy

## 2025-01-13 ENCOUNTER — Encounter: Admitting: Pulmonary Disease

## 2025-01-16 ENCOUNTER — Ambulatory Visit: Admitting: Podiatry

## 2025-01-22 ENCOUNTER — Ambulatory Visit: Admitting: Dermatology

## 2025-03-05 ENCOUNTER — Ambulatory Visit: Admitting: Dermatology
